# Patient Record
Sex: Male | Born: 1935 | Race: White | Hispanic: No | Smoking: Former smoker
Health system: Southern US, Community
[De-identification: ages and names within clinical notes are randomized; demographics above are authoritative.]

## PROBLEM LIST (undated history)

## (undated) DIAGNOSIS — N400 Enlarged prostate without lower urinary tract symptoms: Secondary | ICD-10-CM

## (undated) DIAGNOSIS — G459 Transient cerebral ischemic attack, unspecified: Secondary | ICD-10-CM

## (undated) DIAGNOSIS — N289 Disorder of kidney and ureter, unspecified: Secondary | ICD-10-CM

## (undated) DIAGNOSIS — I10 Essential (primary) hypertension: Secondary | ICD-10-CM

## (undated) DIAGNOSIS — N189 Chronic kidney disease, unspecified: Secondary | ICD-10-CM

## (undated) DIAGNOSIS — J90 Pleural effusion, not elsewhere classified: Secondary | ICD-10-CM

## (undated) DIAGNOSIS — E785 Hyperlipidemia, unspecified: Secondary | ICD-10-CM

## (undated) DIAGNOSIS — E70319 Ocular albinism, unspecified: Secondary | ICD-10-CM

## (undated) HISTORY — DX: Transient cerebral ischemic attack, unspecified: G45.9

## (undated) HISTORY — PX: LIVER BIOPSY: SHX301

## (undated) HISTORY — DX: Benign prostatic hyperplasia without lower urinary tract symptoms: N40.0

## (undated) HISTORY — DX: Pleural effusion, not elsewhere classified: J90

## (undated) HISTORY — PX: HERNIA REPAIR: SHX51

## (undated) HISTORY — DX: Essential (primary) hypertension: I10

## (undated) HISTORY — DX: Ocular albinism, unspecified: E70.319

## (undated) HISTORY — DX: Chronic kidney disease, unspecified: N18.9

## (undated) HISTORY — DX: Hyperlipidemia, unspecified: E78.5

---

## 1997-12-28 ENCOUNTER — Other Ambulatory Visit: Admission: RE | Admit: 1997-12-28 | Discharge: 1997-12-28 | Payer: Self-pay | Admitting: Urology

## 1998-11-23 ENCOUNTER — Encounter: Payer: Self-pay | Admitting: Emergency Medicine

## 1998-11-23 ENCOUNTER — Emergency Department (HOSPITAL_COMMUNITY): Admission: EM | Admit: 1998-11-23 | Discharge: 1998-11-23 | Payer: Self-pay | Admitting: Emergency Medicine

## 2001-06-29 ENCOUNTER — Ambulatory Visit (HOSPITAL_COMMUNITY): Admission: RE | Admit: 2001-06-29 | Discharge: 2001-06-29 | Payer: Self-pay | Admitting: Gastroenterology

## 2001-06-29 ENCOUNTER — Encounter (INDEPENDENT_AMBULATORY_CARE_PROVIDER_SITE_OTHER): Payer: Self-pay

## 2004-09-02 ENCOUNTER — Ambulatory Visit (HOSPITAL_COMMUNITY): Admission: RE | Admit: 2004-09-02 | Discharge: 2004-09-02 | Payer: Self-pay | Admitting: Gastroenterology

## 2006-04-21 ENCOUNTER — Inpatient Hospital Stay (HOSPITAL_COMMUNITY): Admission: EM | Admit: 2006-04-21 | Discharge: 2006-04-27 | Payer: Self-pay | Admitting: Emergency Medicine

## 2006-04-22 ENCOUNTER — Ambulatory Visit: Payer: Self-pay | Admitting: Internal Medicine

## 2006-04-24 ENCOUNTER — Encounter (INDEPENDENT_AMBULATORY_CARE_PROVIDER_SITE_OTHER): Payer: Self-pay | Admitting: Specialist

## 2006-05-06 ENCOUNTER — Ambulatory Visit (HOSPITAL_COMMUNITY): Admission: RE | Admit: 2006-05-06 | Discharge: 2006-05-06 | Payer: Self-pay | Admitting: Internal Medicine

## 2007-03-21 ENCOUNTER — Emergency Department (HOSPITAL_COMMUNITY): Admission: EM | Admit: 2007-03-21 | Discharge: 2007-03-21 | Payer: Self-pay | Admitting: Emergency Medicine

## 2007-04-02 ENCOUNTER — Ambulatory Visit (HOSPITAL_COMMUNITY): Admission: RE | Admit: 2007-04-02 | Discharge: 2007-04-02 | Payer: Self-pay | Admitting: General Surgery

## 2007-10-07 ENCOUNTER — Ambulatory Visit (HOSPITAL_COMMUNITY): Admission: RE | Admit: 2007-10-07 | Discharge: 2007-10-07 | Payer: Self-pay | Admitting: Internal Medicine

## 2007-10-08 ENCOUNTER — Ambulatory Visit (HOSPITAL_COMMUNITY): Admission: RE | Admit: 2007-10-08 | Discharge: 2007-10-08 | Payer: Self-pay | Admitting: Internal Medicine

## 2007-10-15 ENCOUNTER — Ambulatory Visit (HOSPITAL_COMMUNITY): Admission: RE | Admit: 2007-10-15 | Discharge: 2007-10-15 | Payer: Self-pay | Admitting: Internal Medicine

## 2007-10-15 ENCOUNTER — Encounter (INDEPENDENT_AMBULATORY_CARE_PROVIDER_SITE_OTHER): Payer: Self-pay | Admitting: Diagnostic Radiology

## 2007-11-11 ENCOUNTER — Ambulatory Visit (HOSPITAL_COMMUNITY): Admission: RE | Admit: 2007-11-11 | Discharge: 2007-11-11 | Payer: Self-pay | Admitting: Internal Medicine

## 2007-12-15 ENCOUNTER — Ambulatory Visit (HOSPITAL_COMMUNITY): Admission: RE | Admit: 2007-12-15 | Discharge: 2007-12-15 | Payer: Self-pay | Admitting: Internal Medicine

## 2008-10-08 ENCOUNTER — Emergency Department (HOSPITAL_COMMUNITY): Admission: EM | Admit: 2008-10-08 | Discharge: 2008-10-08 | Payer: Self-pay | Admitting: Emergency Medicine

## 2008-10-08 DIAGNOSIS — E876 Hypokalemia: Secondary | ICD-10-CM | POA: Insufficient documentation

## 2008-10-08 DIAGNOSIS — E860 Dehydration: Secondary | ICD-10-CM

## 2008-10-08 DIAGNOSIS — E871 Hypo-osmolality and hyponatremia: Secondary | ICD-10-CM | POA: Insufficient documentation

## 2008-10-10 ENCOUNTER — Ambulatory Visit (HOSPITAL_COMMUNITY): Admission: RE | Admit: 2008-10-10 | Discharge: 2008-10-10 | Payer: Self-pay | Admitting: Internal Medicine

## 2008-10-13 ENCOUNTER — Ambulatory Visit (HOSPITAL_COMMUNITY): Admission: RE | Admit: 2008-10-13 | Discharge: 2008-10-13 | Payer: Self-pay | Admitting: Internal Medicine

## 2008-10-19 ENCOUNTER — Encounter: Payer: Self-pay | Admitting: Infectious Diseases

## 2008-10-19 DIAGNOSIS — E119 Type 2 diabetes mellitus without complications: Secondary | ICD-10-CM | POA: Insufficient documentation

## 2008-10-19 DIAGNOSIS — IMO0002 Reserved for concepts with insufficient information to code with codable children: Secondary | ICD-10-CM | POA: Insufficient documentation

## 2008-10-20 ENCOUNTER — Ambulatory Visit: Payer: Self-pay | Admitting: Infectious Diseases

## 2008-10-20 DIAGNOSIS — K750 Abscess of liver: Secondary | ICD-10-CM | POA: Insufficient documentation

## 2008-10-24 ENCOUNTER — Ambulatory Visit (HOSPITAL_COMMUNITY): Admission: RE | Admit: 2008-10-24 | Discharge: 2008-10-24 | Payer: Self-pay | Admitting: Internal Medicine

## 2008-10-27 ENCOUNTER — Ambulatory Visit: Payer: Self-pay | Admitting: Infectious Diseases

## 2008-11-30 ENCOUNTER — Ambulatory Visit (HOSPITAL_COMMUNITY): Admission: RE | Admit: 2008-11-30 | Discharge: 2008-11-30 | Payer: Self-pay | Admitting: Internal Medicine

## 2009-06-07 DIAGNOSIS — E785 Hyperlipidemia, unspecified: Secondary | ICD-10-CM | POA: Insufficient documentation

## 2009-06-07 DIAGNOSIS — N401 Enlarged prostate with lower urinary tract symptoms: Secondary | ICD-10-CM | POA: Insufficient documentation

## 2009-06-16 HISTORY — PX: COLON SURGERY: SHX602

## 2009-08-22 ENCOUNTER — Ambulatory Visit (HOSPITAL_COMMUNITY): Admission: RE | Admit: 2009-08-22 | Discharge: 2009-08-22 | Payer: Self-pay | Admitting: Internal Medicine

## 2010-07-07 ENCOUNTER — Encounter: Payer: Self-pay | Admitting: Internal Medicine

## 2010-07-18 NOTE — Assessment & Plan Note (Signed)
Summary: 10:30 new liver abscess   CC:  nausea and pain in ruq .  History of Present Illness: Mr. Gabler is 77 yo man referred by Dr. Othelia Pulling for consulatation for recurrent liver abscess. Mr. Schramm had liver abscesses diagnosed previously in 2006 and 2008 and treated with aspirate on one occasion with negative cultures reportedly and empircal therapy with cipro and metronidazole. I do not have all the details but patient said that he had negative abd ultrasounds. He was diagnosed with current abscess at the beginning of the 4th week of April 2010. Abscess was drained percutaneouly in radiology at [facility] and cultures are sterile but gram stain showed frank pus but no bacteria. Since drainage and empiric therapy with ciprofloxacin and metronidazole begun by Dr. Jacky Kindle, he has improved with resolution of fevers and some improvement generally. WBC has fallen from 20k range to 11k this week. He has local pain at cath drainage and drainage has been decreasin with only 200 or so CCs per 24 hrs. I do not know if he has had MR cholangiogram but if not will recommend to Dr. Jacky Kindle.   Current Allergies (reviewed today): ! PCN Vital Signs:  Patient profile:   77 year old male Temp:     97.0 degrees F (36.11 degrees C) oral Pulse rate:   99 / minute BP sitting:   159 / 84  (left arm)  Vitals Entered By: Starleen Arms CMA (Oct 20, 2008 10:50 AM) CC: nausea, pain in ruq  Is Patient Diabetic? Yes  Pain Assessment Patient in pain? yes     Location: ruq Intensity: 5 Type: aching Onset of pain  Intermittent Nutritional Status Detail no appetite  Have you ever been in a relationship where you felt threatened, hurt or afraid?No   Does patient need assistance? Functional Status Self care Ambulation Normal   Physical Exam  General:  alert, underweight appearing, and pale.   Mouth:  pharynx pink and moist and fair dentition.   Neck:  supple and no masses.   Chest Wall:  no tenderness.    Lungs:  normal respiratory effort and R base crackles.   Heart:  normal rate, no murmur, and no rub.   Abdomen:  RUQ percutaeous drain that is mildly tender when manipulated. No liver felt. Drain bulb has about 100cc of serous drainage and represents last 24 hrs.normal bowel sounds, no distention, no masses, and no rebound tenderness.     Impression & Recommendations:  Problem # 1:  ABSCESS, LIVER, PYOGENIC (ICD-572.0)  Recurrent abscess raises concern for biliary problems and if not done since I don't have access to all records MR cholagniogram should be done. He is responding to empiric antibiotics over a week and a half and will treat for 4 weeks. Will followup in a week.  Orders: Consultation Level IV (04540)  Patient Instructions: 1)  Please schedule a follow-up appointment in 1 week, May 14,2010.

## 2010-07-18 NOTE — Miscellaneous (Signed)
Summary: problem list, medications, allergies  Clinical Lists Changes  Problems: Added new problem of DIABETES MELLITUS (ICD-250.00) Added new problem of HYPERTENSION NEC (ICD-997.91) Medications: Added new medication of LISINOPRIL 20 MG TABS (LISINOPRIL) Take 1 tablet by mouth once a day Added new medication of LISINOPRIL 30 MG TABS (LISINOPRIL) Take 1 tablet by mouth once a day Added new medication of TOPROL XL 50 MG XR24H-TAB (METOPROLOL SUCCINATE) Take 1 tablet by mouth once a day Added new medication of * GLUCOTROL TABS (GLIPIZIDE) Take 1 tablet by mouth once a day Added new medication of METFORMIN HCL 500 MG TABS (METFORMIN HCL) Take 1 tablet by mouth once a day Added new medication of CIPROFLOXACIN HCL 500 MG TABS (CIPROFLOXACIN HCL) Take 1 tablet by mouth two times a day Added new medication of * METRONIDAZOLE TABS (METRONIDAZOLE) Take 1 tablet by mouth two times a day Allergies: Added new allergy or adverse reaction of PCN Observations: Added new observation of NKA: F (10/19/2008 9:21)

## 2010-07-18 NOTE — Miscellaneous (Signed)
Summary: HIPAA Restrictions  HIPAA Restrictions   Imported By: Florinda Marker 10/20/2008 15:36:29  _____________________________________________________________________  External Attachment:    Type:   Image     Comment:   External Document

## 2010-07-18 NOTE — Miscellaneous (Signed)
Summary: Problem list update  Clinical Lists Changes  Problems: Added new problem of DEHYDRATION (ICD-276.51) Added new problem of HYPOKALEMIA (ICD-276.8)

## 2010-09-25 LAB — COMPREHENSIVE METABOLIC PANEL
ALT: 38 U/L (ref 0–53)
AST: 34 U/L (ref 0–37)
Albumin: 2.6 g/dL — ABNORMAL LOW (ref 3.5–5.2)
Alkaline Phosphatase: 102 U/L (ref 39–117)
BUN: 27 mg/dL — ABNORMAL HIGH (ref 6–23)
CO2: 26 mEq/L (ref 19–32)
Calcium: 8.1 mg/dL — ABNORMAL LOW (ref 8.4–10.5)
Chloride: 88 mEq/L — ABNORMAL LOW (ref 96–112)
Creatinine, Ser: 1.46 mg/dL (ref 0.4–1.5)
GFR calc Af Amer: 56 mL/min — ABNORMAL LOW (ref >60)
GFR calc non Af Amer: 46 mL/min — ABNORMAL LOW (ref >60)
Glucose, Bld: 228 mg/dL — ABNORMAL HIGH (ref 70–99)
Potassium: 2.9 mEq/L — ABNORMAL LOW (ref 3.5–5.1)
Sodium: 124 mEq/L — ABNORMAL LOW (ref 135–145)
Total Bilirubin: 0.8 mg/dL (ref 0.3–1.2)
Total Protein: 5.3 g/dL — ABNORMAL LOW (ref 6.0–8.3)

## 2010-09-25 LAB — CBC
HCT: 31.7 % — ABNORMAL LOW (ref 39.0–52.0)
HCT: 32.3 % — ABNORMAL LOW (ref 39.0–52.0)
Hemoglobin: 10.8 g/dL — ABNORMAL LOW (ref 13.0–17.0)
Hemoglobin: 11.5 g/dL — ABNORMAL LOW (ref 13.0–17.0)
MCHC: 34 g/dL (ref 30.0–36.0)
MCHC: 35.5 g/dL (ref 30.0–36.0)
MCV: 92.6 fL (ref 78.0–100.0)
MCV: 93.7 fL (ref 78.0–100.0)
Platelets: 264 10*3/uL (ref 150–400)
Platelets: 418 10*3/uL — ABNORMAL HIGH (ref 150–400)
RBC: 3.38 MIL/uL — ABNORMAL LOW (ref 4.22–5.81)
RBC: 3.49 MIL/uL — ABNORMAL LOW (ref 4.22–5.81)
RDW: 11.8 % (ref 11.5–15.5)
RDW: 12.7 % (ref 11.5–15.5)
WBC: 11.7 10*3/uL — ABNORMAL HIGH (ref 4.0–10.5)
WBC: 13.2 10*3/uL — ABNORMAL HIGH (ref 4.0–10.5)

## 2010-09-25 LAB — CK TOTAL AND CKMB (NOT AT ARMC)
CK, MB: 1.8 ng/mL (ref 0.3–4.0)
Relative Index: INVALID (ref 0.0–2.5)
Total CK: 36 U/L (ref 7–232)

## 2010-09-25 LAB — URINALYSIS, ROUTINE W REFLEX MICROSCOPIC
Bilirubin Urine: NEGATIVE
Glucose, UA: 100 mg/dL — AB
Hgb urine dipstick: NEGATIVE
Nitrite: NEGATIVE
Protein, ur: 30 mg/dL — AB
Specific Gravity, Urine: 1.019 (ref 1.005–1.030)
Urobilinogen, UA: 1 mg/dL (ref 0.0–1.0)
pH: 7 (ref 5.0–8.0)

## 2010-09-25 LAB — DIFFERENTIAL
Basophils Absolute: 0.1 10*3/uL (ref 0.0–0.1)
Basophils Relative: 1 % (ref 0–1)
Eosinophils Absolute: 0.1 10*3/uL (ref 0.0–0.7)
Eosinophils Relative: 1 % (ref 0–5)
Lymphocytes Relative: 5 % — ABNORMAL LOW (ref 12–46)
Lymphs Abs: 0.6 10*3/uL — ABNORMAL LOW (ref 0.7–4.0)
Monocytes Absolute: 1 10*3/uL (ref 0.1–1.0)
Monocytes Relative: 9 % (ref 3–12)
Neutro Abs: 10 10*3/uL — ABNORMAL HIGH (ref 1.7–7.7)
Neutrophils Relative %: 86 % — ABNORMAL HIGH (ref 43–77)

## 2010-09-25 LAB — APTT: aPTT: 29 seconds (ref 24–37)

## 2010-09-25 LAB — GLUCOSE, CAPILLARY: Glucose-Capillary: 75 mg/dL (ref 70–99)

## 2010-09-25 LAB — CULTURE, ROUTINE-ABSCESS: Culture: NO GROWTH

## 2010-09-25 LAB — TROPONIN I: Troponin I: 0.03 ng/mL (ref 0.00–0.06)

## 2010-09-25 LAB — PROTIME-INR
INR: 1.3 (ref 0.00–1.49)
Prothrombin Time: 16.2 seconds — ABNORMAL HIGH (ref 11.6–15.2)

## 2010-09-25 LAB — URINE MICROSCOPIC-ADD ON

## 2010-10-29 NOTE — Op Note (Signed)
NAMEDELIO, SLATES               ACCOUNT NO.:  [id number]   MEDICAL RECORD NO.:  [id number]          PATIENT TYPE:  AMB   LOCATION:  DAY                          FACILITY:  [facility]   PHYSICIAN:  Lennie Muckle, MD      DATE OF BIRTH:  April 13, 1925   DATE OF PROCEDURE:  04/02/2007  DATE OF DISCHARGE:  04/02/2007                               OPERATIVE REPORT   PREOPERATIVE DIAGNOSIS:  Right inguinal hernia.   POSTOPERATIVE DIAGNOSIS:  Right inguinal hernia.   PROCEDURE:  Laparoscopic right inguinal hernia repair with mesh.   SURGEON:  Hotel manager.   ASSISTANT:  Avel Peace.   ANESTHESIA:  General endotracheal anesthesia.   FINDINGS:  Large direct hernia.   SPECIMENS:  None.   BLOOD LOSS:  Minimal blood loss.   COMPLICATIONS:  No immediately complications.   CONDITION:  The patient stable condition to PACU.   INDICATIONS FOR PROCEDURE:  Mr. Hink is an 77 year old male, who is  very active, had complaints of right inguinal pain, which increased with  exertional activities. Was found to have a right inguinal hernia.  Desired to have laparoscopic inguinal hernia repair.   DETAILS OF PROCEDURE:  The patient was identified in the preoperative  holding suite. He did receive IV antibiotics preoperatively of  Vancomycin. He was then taken to the operating room and placed in the  supine position. After administration of general endotracheal  anesthesia, his abdomen and groin were prepped in the usual sterile  fashion. An appropriate time out procedure indicating the patient and  the procedure were performed. An infraumbilical incision was placed with  a #11 blade after anesthetizing with quarter percent Marcaine. Anterior  rectus sheath was identified was incised to identify the rectus muscle.  The preperitoneal space was bluntly dissected with the finger. The space  maker balloon was then placed into the preperitoneal space. The space  was dissected with the balloon under  visualization with the camera.  After waiting an appropriate time for capillary constriction, the  balloon was then removed. Pneumoperitoneum within the preperitoneal  space was established. The hernia was easily identified in the right  inguinal region. There was a direct hernia noted. The hernia sac was  dissected away from the surrounding tissues with blunt dissection. The  spermatic cord and vessels were clearly identified along with the  internal iliac vessels. A 3 x 6 piece of Ultrafoam mesh was placed in  the preperitoneal space. It was secured with multiple tacks around the  direct hernia site, also tacked laterally for positioning of the mesh.  The inferior portion of  the mesh was held in place with release of the pneumoperitoneum. The  fascia was closed wit 0 Vicryl suture. The skin was closed with 4-0  Monocryl. Steri-Strips were placed for final dressing. The patient was  then extubated and transported to the postanesthesia care in stable  condition.      Lennie Muckle, MD  Electronically Signed     ALA/MEDQ  D:  04/02/2007  T:  04/04/2007  Job:  [id number]

## 2010-11-01 NOTE — Discharge Summary (Signed)
NAME:  Aaron Landry NO.:  [id number]   MEDICAL RECORD NO.:  [id number]         PATIENT TYPE:  LINP   LOCATION:                               FACILITY:  [facility]   PHYSICIAN:  Geoffry Paradise, M.D.  DATE OF BIRTH:  May 09, 1925   DATE OF ADMISSION:  04/21/2006  DATE OF DISCHARGE:  04/27/2006                               DISCHARGE SUMMARY   DISCHARGE DIAGNOSES:  1. Hepatic abscess.  2. Diabetes mellitus, type 2.  3. Essential hypertension.  4. Remote transient ischemic attack.  5. Essential hypertension.   HISTORY OF PRESENT ILLNESS:  [name] is a very pleasant gentleman  who is 6, with illnesses including hypertension, diabetes mellitus,  type 2, benign prostatic hypertrophy, hyperlipidemia, remote TIA  presenting at this time with progressive fevers and weakness.  He was  initially seen October 30th, with complaints of feverishness and  myalgias.  He underwent a subsequent 2-week course of workup with  nothing clinically obvious on physical exam and an empiric course of  antibiotic pending titers for [facility] spotted fever.  Ultimately,  with recrudescence and fever up to 102 despite benign abdominal findings  and no GI symptomatology beyond anorexia and nausea, an empiric CT of  the abdomen was done scanning for occult abscess, and this revealed a 6-  to-7-cm parenchymal liver mass felt to be either necrotic tumor or  abscess resulting in this admission.  For further details see the  dictated H&P on the chart.   DATA:  CBC:  Hemoglobin was 10.2, hematocrit 28.7, white blood count  initially 13.5, prior to discharge this was 7.5.  Sedimentation rate was  96.  PT 16.9, PTT 35.  Chemistries upon admission:  Sodium 118,  potassium 2.8, chloride 81, CO2 26, glucose 264, BUN 16, creatinine 1.3,  calcium 7.9, protein 6, albumin 2.2.  Prior to discharge, sodium was up  to 123.  Blood cultures x2 were negative.  Leptospirosis antibody less  than 1:50  which is negative.  Rickettsial titers outpatient when  negative.  EKG normal sinus rhythm, no acute changes.  Chest x-ray:  COPD, no active disease.  CT scan of the abdomen revealed a large  necrotic mass approximately 10 x 7-cm, fell to be more compatible with  abscess but remote necrotic tumor was possible.   HOSPITAL COURSE:  The patient was admitted.  Infectious disease and  interventional radiology consulted.  Percutaneous aspiration and drain  was placed into this large mass and it was clearly compatible with  abscess.  The patient was treated empirically with IV Cipro and  metronidazole as he is panallergic and he ultimately defervesced, gained  strength.  White cell count normalized.  Drainage slowly improved.  Repeat scan showed slow improvement with regards to diminished size down  to approximately 7 x 6-cm, but continued need for catheter drainage.  Cultures remained negative and the patient was discharged on oral  antibiotics to include his Cipro and Flagyl following conversion under  the guidance from infectious disease.  Ultimate aspiration revealed no  malignant cells but did reveal acute inflammatory cells and  cultures  remained negative.   The patient was ultimately discharged on Cipro 500 b.i.d. and  metronidazole 500 b.i.d. indefinitely.   He is to be followed up in the office and ultimately re-scanned with  drain left in until total resolution of this mass.   Diabetes and cardiopulmonary status remained stable throughout  hospitalization.   Overall prognosis is good.           ______________________________  Geoffry Paradise, M.D.     RA/MEDQ  D:  05/19/2006  T:  05/19/2006  Job:  [id number]

## 2010-11-01 NOTE — H&P (Signed)
NAMEAMON, COSTILLA NO.:  [id number]   MEDICAL RECORD NO.:  [id number]          PATIENT TYPE:  INP   LOCATION:  1424                         [facility]AC[facility]   PHYSICIAN:  Geoffry Paradise, M.D.  DATE OF BIRTH:  [birth year]   DATE OF ADMISSION:  04/21/2006  DATE OF DISCHARGE:                                HISTORY & PHYSICAL   CHIEF COMPLAINT:  Fevers and weakness.   HISTORY OF PRESENT ILLNESS:  Aaron Landry is a 77 year old gentleman, well  known to myself, with history of diabetes mellitus, type 2, hypertension,  hyperlipidemia, and remote TIAs, presenting at this time with 7-10 days of  fever, myalgias, and weakness. He was initially seen for these complaints  April 14, 2006, after working, complaining of 5-6 days of feeling ill. He  was seen just 2 weeks earlier, April 02, 2006, doing exceptionally well.  On April 14, 2006, he related illness of 5-6 days consisting of low-grade  fevers, chills, myalgias, but no cough or sputum, no nausea or vomiting, and  no abdominal pain. Chest x-ray at that time was unremarkable and he was  treated empirically with some doxycycline pending [facility] Spotted  Fever titers. Since that time, he has continued with continued  symptomatology to include intermittent fevers sometimes up to 102 but mainly  low grade, chills, myalgias, malaise, and most recently anorexia and low-  grade nausea. He has actually had no abdominal pain, no back pain, no cough  or sputum. He has had no unusual headaches, no rashes, and no joint  symptoms. Bowel habits have been normal with no diarrhea or bright red blood  per rectum. He has had no urinary symptoms. He has had at least 2 workups in  the office to look at his urine which have been negative times 2, blood work  which has been remarkable for a leukocytosis and left shift, as well as a  progressive hyponatremia and mildly abnormal transaminases. In the office  today, given his  continued symptomatology, we did empirically do an  abdominal CT despite a benign exam looking for occult abscess and, indeed,  found a 6-7 cm parenchymal liver mass felt to be either a necrotic tumor or  abscess. He is admitted for further cultures, empiric antibiotics, IV  fluids, and further diagnostic workup.   PAST MEDICAL HISTORY:  The patient is allergic to PENICILLIN.   MEDICATIONS INCLUDE:  1. Glipizide XL 5 mg daily.  2. Lisinopril & hydrochlorothiazide 20/12.5 daily.  3. Toprol XL 50 mg daily.  4. Multivitamin 1 daily.  5. Glucosamine daily.  6. B complex daily.  7. Aspirin 81 mg daily.  8. Skelaxin 800 t.i.d.   MEDICAL ILLNESSES INCLUDE:  1. Diabetes mellitus, type 2, times 15 years.  2. Essential hypertension.  3. BPH.  4. Hyperlipidemia.  5. Remote TIA.   SURGICAL INTERVENTIONS INCLUDE:  1. Colonoscopy 2003 and 2006.  2. Carotid Dopplers 2005.  3. Stress test 1997.  4. Cardiolite stress test in 2000.  5. Hernia repair in 1982.   FAMILY HISTORY:  Noncontributory.   SOCIAL HISTORY:  The  patient is married, retired, Forensic scientist. Does  not smoke or drink. Avid daily exerciser. Has 2 children and 3  grandchildren.   PHYSICAL EXAMINATION:  Temperature 97.7 today, blood pressure 118/60, pulse  76, weight is 175 pounds, actually has stable over the last 6 months. He is  ill appearing, weak, skin turgor is down.  HEENT:  Normocephalic, atraumatic individual, bitemporal wasting, oropharynx  benign.  NECK:  No JVD or bruits.  LYMPH NODES:  Negative.  LUNGS:  Are clear.  CARDIOVASCULAR EXAM:  Regular rate and rhythm. No murmur, gallop, rub, or  heave.  CHEST WALL:  No breast tenderness. No breast mass.  ABDOMEN:  Is soft, nontender. No hepatomegaly. Bowel sounds normal.  BACK:  Is without CVA or spinal tenderness.  EXTREMITIES:  No cyanosis, clubbing, or edema. Intact distal pulses. Joints  normal. Calves soft and nontender.  NEUROLOGIC EXAM:  Is  completely nonlateralizing. Higher cortical function  intact. Motor and sensation intact. Motor and sensation intact. Gait is  normal beyond diffuse weakness.   ASSESSMENT:  1. Febrile illness with systemic symptoms and anorexia with newly found 6-      7 centimeter solitary liver mass, necrotic tumor versus abscess.  2. Diabetes mellitus, type 2.  3. Essential hypertension.  4. Remote transient ischemic attack.   PLAN:  The patient is admitted for empiric antibiotics to include Cipro and  metronidazole given his PENICILLIN ALLERGY. Repeat blood cultures as  outpatient blood cultures were negative. Leptospira titers and ultrasound of  the liver with biopsy/aspiration to discern abscess versus tumor. Further  consultations, whether it be ID, General Surgery, GI, pending this initial  workup.           ______________________________  Geoffry Paradise, M.D.     RA/MEDQ  D:  04/21/2006  T:  04/22/2006  Job:  [id number]

## 2010-11-01 NOTE — Op Note (Signed)
[facility]  Patient:    GIOVONI, BUNCH Visit Number: [id number] MRN: [id number]          Service Type: END Location: ENDO Attending Physician:  Nelda Marseille Dictated by:   Petra Kuba, M.D. Proc. Date: 06/29/01 Admit Date:  06/29/2001   CC:         [name]. Jacky Kindle, M.D.   Operative Report  PROCEDURE:  Colonoscopy.  INDICATION:  Due for colonic screen with some anorectal complaints. Consent was signed after risk, benefits, methods, and options were thoroughly discussed in the office.  MEDICINES USED:  Demerol 100 mg, Versed 8 mg.  DESCRIPTION OF PROCEDURE:  Rectal inspection was pertinent for external hemorrhoids significant. Digital exam was negative. The video colonoscope was inserted and despite significant left-sided diverticula, was able to be advanced to the cecum. This did not require any abdominal pressure or any position changes. The cecum was identified by the appendiceal orifice and the ileocecal valve. The prep was fairly adequate. Due to the number of diverticula, lots of washing and suctioning was done but still some stool balls were unable to be suctioned. On slow withdrawal through the colon, there was a questionable polyp in the ascending colon, which did require a cold biopsy. However, in the mid transverse, a 2-mm polyp was seen and was hot biopsied x 2. There was an occasional right-sided diverticula, but much greater on the left. No other polypoid lesions were seen as we slowly withdrew back to the rectum. In the rectum, two small polyps were seen. Each were hot biopsied and put in a separate container. The scope was retroflexed revealing some small internal hemorrhoids. Scope was straightened, air was suctioned, and scope removed. The patient tolerated the procedure well. There was no obvious immediate complication.  ENDOSCOPIC DIAGNOSES: 1. Internal/external hemorrhoids. 2. Left greater than right  diverticula. 3. Two small rectal polyps, hot biopsy. 4. Transverse small polyp, hot biopsy; ascending, questionable polyp, cold    biopsy. 5. Otherwise within normal limits to the cecum.  PLAN:  Await pathology to determine future colonic screening. Hemorrhoid and diverticula information. Happy to see back p.r.n. Prescription creams p.r.n. for his hemorrhoids. Otherwise, return care to Dr. Jacky Kindle for his customary healthcare maintenance to include yearly rectals and guaiacs. Dictated by:   Petra Kuba, M.D. Attending Physician:  Nelda Marseille DD:  06/29/01 TD:  06/29/01 Job: [id number]

## 2010-11-01 NOTE — Op Note (Signed)
NAMECOYE, DAWOOD               ACCOUNT NO.:  [id number]   MEDICAL RECORD NO.:  [id number]          PATIENT TYPE:  AMB   LOCATION:  ENDO                         FACILITY:  [facility]   PHYSICIAN:  Petra Kuba, M.D.    DATE OF BIRTH:  1925/05/15   DATE OF PROCEDURE:  09/02/2004  DATE OF DISCHARGE:                                 OPERATIVE REPORT   PROCEDURE:  Colonoscopy.   INDICATIONS:  History of colon polyps, due for repeat screening.    Consent was signed after risks, benefits, methods and options thoroughly  discussed in the office in the past.   MEDICATIONS USED:  Demerol 50, Versed 4.   DESCRIPTION OF PROCEDURE:  Rectal inspection is pertinent for external  hemorrhoids.  Small digital exam was negative.  Video pediatric adjustable  colonoscope was inserted and with minimal difficulty despite significant  left-sided diverticula and only a fairly adequate prep, we were able to  advance to the cecum.  Other than the left greater than right diverticula,  no abnormalities were seen.  No position changes or abdominal pressure was  needed.  The cecum was identified by the appendiceal orifice and the  ileocecal valve.  The prep was only fairly adequate.  There was lots of  liquid stool that required washing and suctioning and some stool balls which  were stuck in tics were stuck to the wall of the colon.  Upon slow  withdrawal through the colon, other than the left greater than right  diverticula, no abnormalities were seen.  Anal rectal pull through in  retroflexion confirmed some small hemorrhoids.  The scope was straightened  and re-advanced a short ways up the left side of the colon.  The air was  suctioned, the scope removed.  The patient tolerated the procedure well.  There was no obvious immediate complications.   ENDOSCOPIC DIAGNOSES:  1.  Internal and external hemorrhoids.  2.  Left greater than right diverticula.  3.  Otherwise within normal limits in the cecum.   PLAN:  Recheck screening in five years.  Happy to see back p.r.n. otherwise  return care to Dr. Fredia Beets for the customary health care maintenance to  include yearly rectal's and guaiac's.       ___________________________________________  Petra Kuba, M.D.    MEM/MEDQ  D:  09/02/2004  T:  09/02/2004  Job:  [id number]   cc:   Geoffry Paradise, M.D.  [address]  [place]  [place] [address]  Fax: [phone]

## 2011-03-26 LAB — URINALYSIS, ROUTINE W REFLEX MICROSCOPIC
Bilirubin Urine: NEGATIVE
Glucose, UA: NEGATIVE
Hgb urine dipstick: NEGATIVE
Ketones, ur: NEGATIVE
Nitrite: NEGATIVE
Protein, ur: NEGATIVE
Specific Gravity, Urine: 1.016
Urobilinogen, UA: 0.2
pH: 6

## 2011-03-26 LAB — BASIC METABOLIC PANEL
BUN: 43 — ABNORMAL HIGH
BUN: 51 — ABNORMAL HIGH
CO2: 25
CO2: 29
Calcium: 8.8
Calcium: 9.4
Chloride: 100
Chloride: 101
Creatinine, Ser: 1.28
Creatinine, Ser: 1.41
GFR calc Af Amer: 59 — ABNORMAL LOW
GFR calc Af Amer: >60
GFR calc non Af Amer: 48 — ABNORMAL LOW
GFR calc non Af Amer: 54 — ABNORMAL LOW
Glucose, Bld: 168 — ABNORMAL HIGH
Glucose, Bld: 207 — ABNORMAL HIGH
Potassium: 5
Potassium: 5.6 — ABNORMAL HIGH
Sodium: 132 — ABNORMAL LOW
Sodium: 137

## 2011-03-26 LAB — HEPATIC FUNCTION PANEL
ALT: 25
AST: 28
Albumin: 3.4 — ABNORMAL LOW
Alkaline Phosphatase: 90
Bilirubin, Direct: 0.1
Indirect Bilirubin: 0.9
Total Bilirubin: 1
Total Protein: 6.6

## 2011-03-26 LAB — HEMOGLOBIN AND HEMATOCRIT, BLOOD
HCT: 37.3 — ABNORMAL LOW
Hemoglobin: 13.1

## 2011-03-27 LAB — BASIC METABOLIC PANEL
BUN: 24 — ABNORMAL HIGH
CO2: 25
Calcium: 8.7
Chloride: 97
Creatinine, Ser: 1.15
GFR calc Af Amer: >60
GFR calc non Af Amer: >60
Glucose, Bld: 155 — ABNORMAL HIGH
Potassium: 3.7
Sodium: 129 — ABNORMAL LOW

## 2011-03-27 LAB — POCT CARDIAC MARKERS
CKMB, poc: 5.5
Myoglobin, poc: 150
Operator id: 3206
Troponin i, poc: <0.05

## 2011-03-27 LAB — DIFFERENTIAL
Basophils Absolute: 0
Basophils Relative: 0
Eosinophils Absolute: 0.2
Eosinophils Relative: 3
Lymphocytes Relative: 22
Lymphs Abs: 1.6
Monocytes Absolute: 0.6
Monocytes Relative: 8
Neutro Abs: 4.8
Neutrophils Relative %: 67

## 2011-03-27 LAB — CBC
HCT: 35 — ABNORMAL LOW
Hemoglobin: 12.2 — ABNORMAL LOW
MCHC: 34.7
MCV: 91.2
Platelets: 368
RBC: 3.84 — ABNORMAL LOW
RDW: 12.4
WBC: 7.2

## 2011-07-16 DIAGNOSIS — N529 Male erectile dysfunction, unspecified: Secondary | ICD-10-CM | POA: Insufficient documentation

## 2011-07-16 DIAGNOSIS — R972 Elevated prostate specific antigen [PSA]: Secondary | ICD-10-CM | POA: Insufficient documentation

## 2011-12-05 ENCOUNTER — Other Ambulatory Visit: Payer: Self-pay

## 2011-12-05 DIAGNOSIS — I739 Peripheral vascular disease, unspecified: Secondary | ICD-10-CM

## 2011-12-05 DIAGNOSIS — M79609 Pain in unspecified limb: Secondary | ICD-10-CM

## 2012-01-16 ENCOUNTER — Encounter: Payer: Self-pay | Admitting: Vascular Surgery

## 2012-01-26 ENCOUNTER — Encounter: Payer: Self-pay | Admitting: Vascular Surgery

## 2012-01-27 ENCOUNTER — Encounter (INDEPENDENT_AMBULATORY_CARE_PROVIDER_SITE_OTHER): Payer: Medicare Other | Admitting: *Deleted

## 2012-01-27 ENCOUNTER — Ambulatory Visit (INDEPENDENT_AMBULATORY_CARE_PROVIDER_SITE_OTHER): Payer: Medicare Other | Admitting: Vascular Surgery

## 2012-01-27 ENCOUNTER — Encounter: Payer: Self-pay | Admitting: Vascular Surgery

## 2012-01-27 VITALS — BP 135/65 | HR 62 | Resp 16 | Ht 72.0 in | Wt 162.0 lb

## 2012-01-27 DIAGNOSIS — I739 Peripheral vascular disease, unspecified: Secondary | ICD-10-CM

## 2012-01-27 DIAGNOSIS — M79609 Pain in unspecified limb: Secondary | ICD-10-CM

## 2012-01-27 NOTE — Progress Notes (Signed)
[facility] [place]   Patient name: Aaron Landry MRN: [id number] DOB: 10-Apr-1925 Sex: male   Referred by: Jacky Kindle  Reason for referral:  Chief Complaint  Patient presents with  . New Evaluation    leg pain/ Dr. Jacky Kindle    HISTORY OF PRESENT ILLNESS: Patient presents today for evaluation of lower extremity symptoms. He is an active healthy 78 year old gentleman who reported generalized tiredness in both legs. He reports this is equal in his right and left leg. This does not severely limiting and he felt this may be simply related to "old age". He denies any tissue loss and does not have any specific claudication type symptoms. He is on active walking program. He underwent an outpatient ultrasound of his lower extremity is in this did suggest some narrowing in his distal right superficial femoral artery. He did not have any ankle arm indices studies at the same time.  Past Medical History  Diagnosis Date  . Diabetes mellitus   . Hypertension   . Hyperlipidemia   . Pleural effusion   . TIA (transient ischemic attack)   . BPH (benign prostatic hyperplasia)   . OA (ocular albinism)   . Chronic kidney disease     Past Surgical History  Procedure Date  . Hernia repair   . Liver biopsy   . Colon surgery 2011    History   Social History  . Marital Status: Married    Spouse Name: N/A    Number of Children: N/A  . Years of Education: N/A   Occupational History  . Not on file.   Social History Main Topics  . Smoking status: Former Smoker    Types: Cigarettes    Quit date: 06/16/1981  . Smokeless tobacco: Never Used  . Alcohol Use: No  . Drug Use: No  . Sexually Active: Not on file   Other Topics Concern  . Not on file   Social History Narrative  . No narrative on file    Family History  Problem Relation Age of Onset  . Alzheimer's disease Mother   . Coronary artery disease Father   . Diabetes Father   . Heart attack Father      Allergies as of 01/27/2012 - Review Complete 01/27/2012  Allergen Reaction Noted  . Penicillins  10/19/2008    Current Outpatient Prescriptions on File Prior to Visit  Medication Sig Dispense Refill  . aspirin EC 81 MG tablet Take 81 mg by mouth daily.      Marland Kitchen glipiZIDE (GLUCOTROL) 10 MG tablet Take 10 mg by mouth 2 (two) times daily before a meal.      . lisinopril-hydrochlorothiazide (PRINZIDE,ZESTORETIC) 20-12.5 MG per tablet Take 1 tablet by mouth daily.      . metFORMIN (GLUCOPHAGE) 500 MG tablet Take 500 mg by mouth 2 (two) times daily with a meal.      . metoprolol tartrate (LOPRESSOR) 25 MG tablet Take 25 mg by mouth daily.         REVIEW OF SYSTEMS:  Positives indicated with an "X"  CARDIOVASCULAR:  [ ]  chest pain   [ ]  chest pressure   [ ]  palpitations   [ ]  orthopnea   [ ]  dyspnea on exertion   [ ]  claudication   [ ]  rest pain   [ ]  DVT   [ ]  phlebitis PULMONARY:   [ ]  productive cough   [ ]  asthma   [ ]  wheezing NEUROLOGIC:   [ ]  weakness  [ ]   paresthesias  [ ]  aphasia  [ ]  amaurosis  [ ]  dizziness HEMATOLOGIC:   [ ]  bleeding problems   [ ]  clotting disorders MUSCULOSKELETAL:  [ ]  joint pain   [ ]  joint swelling GASTROINTESTINAL: [ ]   blood in stool  [ ]   hematemesis GENITOURINARY:  [ ]   dysuria  [ ]   hematuria PSYCHIATRIC:  [ ]  history of major depression INTEGUMENTARY:  [ ]  rashes  [ ]  ulcers CONSTITUTIONAL:  [ ]  fever   [ ]  chills  PHYSICAL EXAMINATION:  General: The patient is a well-nourished male, in no acute distress. Vital signs are BP 135/65  Pulse 62  Resp 16  Ht 6' (1.829 m)  Wt 162 lb (73.483 kg)  BMI 21.97 kg/m2  SpO2 100% Pulmonary: There is a good air exchange bilaterally without wheezing or rales. Abdomen: Soft and non-tender with normal pitch bowel sounds. Musculoskeletal: There are no major deformities.  There is no significant extremity pain. Neurologic: No focal weakness or paresthesias are detected, Skin: There are no ulcer or rashes  noted. Psychiatric: The patient has normal affect. Cardiovascular: There is a regular rate and rhythm without significant murmur appreciated. Pulse status: 2+ radial 2+ femoral 2+ popliteal pulses bilaterally. He does have 2+ right posterior tibial and absent dorsalis pedis pulse he has palpable left dorsalis pedis and posterior tibial pulses  VVS Vascular Lab Studies:  Ordered and Independently Reviewed normal ankle arm indices bilaterally and normal triphasic waveforms and digital pressure bilaterally  Impression and Plan:  I did review his outpatient ultrasound from another lab in this does suggest elevated velocities in his distal right superficial femoral artery. I explained the patient is certainly is not causing any flow limitation with no active symptoms and no evidence of lower surety arterial insufficiency in our vascular lab. He is comfortable this discussion will continue his walking program we'll notify should he develop any progressive pain with walking or rest pain.    Kyndal Gloster [facility] [place] Office: [phone]

## 2012-06-15 DIAGNOSIS — M5136 Other intervertebral disc degeneration, lumbar region: Secondary | ICD-10-CM | POA: Insufficient documentation

## 2012-07-12 DIAGNOSIS — E1142 Type 2 diabetes mellitus with diabetic polyneuropathy: Secondary | ICD-10-CM | POA: Insufficient documentation

## 2013-09-28 DIAGNOSIS — M1990 Unspecified osteoarthritis, unspecified site: Secondary | ICD-10-CM | POA: Insufficient documentation

## 2014-08-23 ENCOUNTER — Other Ambulatory Visit (HOSPITAL_COMMUNITY): Payer: Self-pay | Admitting: Internal Medicine

## 2014-08-23 DIAGNOSIS — K750 Abscess of liver: Secondary | ICD-10-CM

## 2014-08-24 ENCOUNTER — Other Ambulatory Visit: Payer: Self-pay | Admitting: Radiology

## 2014-08-24 ENCOUNTER — Encounter (HOSPITAL_COMMUNITY): Payer: Self-pay

## 2014-08-24 ENCOUNTER — Ambulatory Visit (HOSPITAL_COMMUNITY)
Admission: RE | Admit: 2014-08-24 | Discharge: 2014-08-24 | Disposition: A | Discharge status: Home / Self Care | Payer: Medicare Other | Source: Ambulatory Visit | Attending: Internal Medicine | Admitting: Internal Medicine

## 2014-08-24 ENCOUNTER — Observation Stay (HOSPITAL_COMMUNITY)
Admission: AD | Admit: 2014-08-24 | Discharge: 2014-08-25 | Disposition: A | Discharge status: Home / Self Care | Payer: Medicare Other | Source: Ambulatory Visit | Attending: Internal Medicine | Admitting: Internal Medicine

## 2014-08-24 VITALS — BP 133/80 | HR 87 | Temp 98.3°F | Resp 13 | Ht 72.0 in | Wt 150.0 lb

## 2014-08-24 DIAGNOSIS — E875 Hyperkalemia: Secondary | ICD-10-CM

## 2014-08-24 DIAGNOSIS — N189 Chronic kidney disease, unspecified: Secondary | ICD-10-CM | POA: Diagnosis not present

## 2014-08-24 DIAGNOSIS — E70319 Ocular albinism, unspecified: Secondary | ICD-10-CM | POA: Insufficient documentation

## 2014-08-24 DIAGNOSIS — Z7982 Long term (current) use of aspirin: Secondary | ICD-10-CM | POA: Diagnosis not present

## 2014-08-24 DIAGNOSIS — Z79899 Other long term (current) drug therapy: Secondary | ICD-10-CM | POA: Insufficient documentation

## 2014-08-24 DIAGNOSIS — K750 Abscess of liver: Secondary | ICD-10-CM | POA: Diagnosis present

## 2014-08-24 DIAGNOSIS — I129 Hypertensive chronic kidney disease with stage 1 through stage 4 chronic kidney disease, or unspecified chronic kidney disease: Secondary | ICD-10-CM | POA: Diagnosis not present

## 2014-08-24 DIAGNOSIS — Z87891 Personal history of nicotine dependence: Secondary | ICD-10-CM | POA: Diagnosis not present

## 2014-08-24 DIAGNOSIS — J90 Pleural effusion, not elsewhere classified: Secondary | ICD-10-CM | POA: Insufficient documentation

## 2014-08-24 DIAGNOSIS — Z8673 Personal history of transient ischemic attack (TIA), and cerebral infarction without residual deficits: Secondary | ICD-10-CM | POA: Diagnosis not present

## 2014-08-24 DIAGNOSIS — N1832 Chronic kidney disease, stage 3b: Secondary | ICD-10-CM | POA: Insufficient documentation

## 2014-08-24 DIAGNOSIS — E118 Type 2 diabetes mellitus with unspecified complications: Secondary | ICD-10-CM

## 2014-08-24 DIAGNOSIS — E860 Dehydration: Secondary | ICD-10-CM | POA: Diagnosis present

## 2014-08-24 DIAGNOSIS — E785 Hyperlipidemia, unspecified: Secondary | ICD-10-CM | POA: Insufficient documentation

## 2014-08-24 DIAGNOSIS — E119 Type 2 diabetes mellitus without complications: Secondary | ICD-10-CM | POA: Insufficient documentation

## 2014-08-24 DIAGNOSIS — I10 Essential (primary) hypertension: Secondary | ICD-10-CM

## 2014-08-24 DIAGNOSIS — E871 Hypo-osmolality and hyponatremia: Secondary | ICD-10-CM | POA: Diagnosis present

## 2014-08-24 DIAGNOSIS — E1122 Type 2 diabetes mellitus with diabetic chronic kidney disease: Secondary | ICD-10-CM | POA: Insufficient documentation

## 2014-08-24 DIAGNOSIS — N179 Acute kidney failure, unspecified: Secondary | ICD-10-CM

## 2014-08-24 DIAGNOSIS — R630 Anorexia: Secondary | ICD-10-CM | POA: Diagnosis not present

## 2014-08-24 DIAGNOSIS — N400 Enlarged prostate without lower urinary tract symptoms: Secondary | ICD-10-CM | POA: Diagnosis not present

## 2014-08-24 LAB — CBC WITH DIFFERENTIAL/PLATELET
Basophils Absolute: 0 10*3/uL (ref 0.0–0.1)
Basophils Relative: 0 % (ref 0–1)
Eosinophils Absolute: 0.1 10*3/uL (ref 0.0–0.7)
Eosinophils Relative: 1 % (ref 0–5)
HCT: 31.3 % — ABNORMAL LOW (ref 39.0–52.0)
Hemoglobin: 10.8 g/dL — ABNORMAL LOW (ref 13.0–17.0)
Lymphocytes Relative: 8 % — ABNORMAL LOW (ref 12–46)
Lymphs Abs: 1 10*3/uL (ref 0.7–4.0)
MCH: 30.8 pg (ref 26.0–34.0)
MCHC: 34.5 g/dL (ref 30.0–36.0)
MCV: 89.2 fL (ref 78.0–100.0)
Monocytes Absolute: 1.1 10*3/uL — ABNORMAL HIGH (ref 0.1–1.0)
Monocytes Relative: 9 % (ref 3–12)
Neutro Abs: 10.7 10*3/uL — ABNORMAL HIGH (ref 1.7–7.7)
Neutrophils Relative %: 82 % — ABNORMAL HIGH (ref 43–77)
Platelets: 488 10*3/uL — ABNORMAL HIGH (ref 150–400)
RBC: 3.51 MIL/uL — ABNORMAL LOW (ref 4.22–5.81)
RDW: 13 % (ref 11.5–15.5)
WBC: 12.9 10*3/uL — ABNORMAL HIGH (ref 4.0–10.5)

## 2014-08-24 LAB — GLUCOSE, CAPILLARY
Glucose-Capillary: 157 mg/dL — ABNORMAL HIGH (ref 70–99)
Glucose-Capillary: 136 mg/dL — ABNORMAL HIGH (ref 70–99)

## 2014-08-24 LAB — BASIC METABOLIC PANEL
Anion gap: 7 (ref 5–15)
BUN: 38 mg/dL — ABNORMAL HIGH (ref 6–23)
CO2: 25 mmol/L (ref 19–32)
Calcium: 8.7 mg/dL (ref 8.4–10.5)
Chloride: 97 mmol/L (ref 96–112)
Creatinine, Ser: 1.65 mg/dL — ABNORMAL HIGH (ref 0.50–1.35)
GFR calc Af Amer: 41 mL/min — ABNORMAL LOW (ref >90)
GFR calc non Af Amer: 36 mL/min — ABNORMAL LOW (ref >90)
Glucose, Bld: 206 mg/dL — ABNORMAL HIGH (ref 70–99)
Potassium: 5.7 mmol/L — ABNORMAL HIGH (ref 3.5–5.1)
Sodium: 129 mmol/L — ABNORMAL LOW (ref 135–145)

## 2014-08-24 LAB — PROTIME-INR
INR: 1.28 (ref 0.00–1.49)
Prothrombin Time: 16.1 seconds — ABNORMAL HIGH (ref 11.6–15.2)

## 2014-08-24 MED ORDER — SODIUM CHLORIDE 0.9 % IV SOLN
INTRAVENOUS | Status: AC
  Administered 2014-08-24: 18:00:00 via INTRAVENOUS

## 2014-08-24 MED ORDER — DOCUSATE SODIUM 100 MG PO CAPS
100.0000 mg | ORAL_CAPSULE | Freq: Two times a day (BID) | ORAL | Status: DC
  Administered 2014-08-24 – 2014-08-25 (×2): 100 mg via ORAL
  Filled 2014-08-24 (×3): qty 1

## 2014-08-24 MED ORDER — INSULIN ASPART 100 UNIT/ML ~~LOC~~ SOLN
0.0000 [IU] | Freq: Three times a day (TID) | SUBCUTANEOUS | Status: DC
  Administered 2014-08-25: 1 [IU] via SUBCUTANEOUS

## 2014-08-24 MED ORDER — CIPROFLOXACIN HCL 500 MG PO TABS
500.0000 mg | ORAL_TABLET | Freq: Every day | ORAL | Status: DC
  Administered 2014-08-24 – 2014-08-25 (×2): 500 mg via ORAL
  Filled 2014-08-24 (×2): qty 1

## 2014-08-24 MED ORDER — MIDAZOLAM HCL 2 MG/2ML IJ SOLN
INTRAMUSCULAR | Status: AC | PRN
  Administered 2014-08-24: 0.5 mg via INTRAVENOUS

## 2014-08-24 MED ORDER — METRONIDAZOLE 500 MG PO TABS
500.0000 mg | ORAL_TABLET | Freq: Two times a day (BID) | ORAL | Status: DC
  Administered 2014-08-24 – 2014-08-25 (×2): 500 mg via ORAL
  Filled 2014-08-24 (×3): qty 1

## 2014-08-24 MED ORDER — TRAMADOL HCL 50 MG PO TABS
50.0000 mg | ORAL_TABLET | Freq: Four times a day (QID) | ORAL | Status: DC | PRN
  Administered 2014-08-25: 50 mg via ORAL
  Filled 2014-08-24 (×2): qty 1

## 2014-08-24 MED ORDER — FENTANYL CITRATE 0.05 MG/ML IJ SOLN
INTRAMUSCULAR | Status: AC
  Filled 2014-08-24: qty 2

## 2014-08-24 MED ORDER — SODIUM POLYSTYRENE SULFONATE 15 GM/60ML PO SUSP
30.0000 g | Freq: Once | ORAL | Status: AC
  Administered 2014-08-24: 30 g via ORAL
  Filled 2014-08-24: qty 120

## 2014-08-24 MED ORDER — ALUM & MAG HYDROXIDE-SIMETH 200-200-20 MG/5ML PO SUSP: 30.0000 mL | Freq: Four times a day (QID) | ORAL | Status: DC | PRN

## 2014-08-24 MED ORDER — SODIUM CHLORIDE 0.9 % IV SOLN
INTRAVENOUS | Status: DC
  Administered 2014-08-24: 13:00:00 via INTRAVENOUS

## 2014-08-24 MED ORDER — ONDANSETRON HCL 4 MG/2ML IJ SOLN: 4.0000 mg | Freq: Four times a day (QID) | INTRAMUSCULAR | Status: DC | PRN

## 2014-08-24 MED ORDER — ACETAMINOPHEN 325 MG PO TABS: 650.0000 mg | ORAL_TABLET | Freq: Four times a day (QID) | ORAL | Status: DC | PRN

## 2014-08-24 MED ORDER — HYDRALAZINE HCL 20 MG/ML IJ SOLN: 10.0000 mg | Freq: Four times a day (QID) | INTRAMUSCULAR | Status: DC | PRN

## 2014-08-24 MED ORDER — METOPROLOL TARTRATE 25 MG PO TABS
25.0000 mg | ORAL_TABLET | Freq: Two times a day (BID) | ORAL | Status: DC
  Administered 2014-08-24 – 2014-08-25 (×2): 25 mg via ORAL
  Filled 2014-08-24 (×3): qty 1

## 2014-08-24 MED ORDER — MIDAZOLAM HCL 2 MG/2ML IJ SOLN
INTRAMUSCULAR | Status: DC
Start: 2014-08-24 — End: 2014-08-25
  Filled 2014-08-24: qty 2

## 2014-08-24 MED ORDER — OXYCODONE HCL 5 MG PO TABS: 5.0000 mg | ORAL_TABLET | ORAL | Status: DC | PRN

## 2014-08-24 MED ORDER — FENTANYL CITRATE 0.05 MG/ML IJ SOLN
INTRAMUSCULAR | Status: AC | PRN
  Administered 2014-08-24: 25 ug via INTRAVENOUS

## 2014-08-24 MED ORDER — ACETAMINOPHEN 650 MG RE SUPP: 650.0000 mg | Freq: Four times a day (QID) | RECTAL | Status: DC | PRN

## 2014-08-24 MED ORDER — ASPIRIN EC 81 MG PO TBEC
81.0000 mg | DELAYED_RELEASE_TABLET | Freq: Every day | ORAL | Status: DC
  Administered 2014-08-25: 81 mg via ORAL
  Filled 2014-08-24: qty 1

## 2014-08-24 MED ORDER — HEPARIN SODIUM (PORCINE) 5000 UNIT/ML IJ SOLN
5000.0000 [IU] | Freq: Three times a day (TID) | INTRAMUSCULAR | Status: DC
  Administered 2014-08-24 – 2014-08-25 (×2): 5000 [IU] via SUBCUTANEOUS
  Filled 2014-08-24 (×5): qty 1

## 2014-08-24 MED ORDER — LIDOCAINE HCL (PF) 1 % IJ SOLN
INTRAMUSCULAR | Status: DC
Start: 2014-08-24 — End: 2014-08-25
  Filled 2014-08-24: qty 10

## 2014-08-24 MED ORDER — ONDANSETRON HCL 4 MG PO TABS: 4.0000 mg | ORAL_TABLET | Freq: Four times a day (QID) | ORAL | Status: DC | PRN

## 2014-08-24 NOTE — Progress Notes (Signed)
Patient arrived to 6e18 from IR after having a liver biopsy. Patient is A&OX4 with no s/s distress noted. Right abdomen with JP drain intact and gauze dressing clean, dry, intact. Awaiting medication orders to be verified. Drain flushed with 5mls normal saline and emptied 40mls cloudy, brown/pink drainage without odor. Patient c/o mild abdominal discomfort. Will medicate with prn's.   Leanna BattlesEckelmann, Kellyann Ordway Eileen, RN.

## 2014-08-24 NOTE — Progress Notes (Signed)
Home medications were not reconciled by pharmacy.  Metoprolol has changed to bid since his previous med rec.    His HCTZ-Lisinopril is being held due to mild dehydration and mildly elevated BUN/Creatinine.    Algis DownsMarianne York, PA-C [facility] Pager: [phone]

## 2014-08-24 NOTE — H&P (Signed)
Chief Complaint: "I am here for a drain to be placed."  Referring Physician(s): [name],Richard  History of Present Illness: Aaron Landry is a 81 y.o. male with history of recurrent hepatic abscesses s/p percutaneous drainage in 2007 and 2010. He has been seen by Dr. Jacky KindleAronson with c/o nausea, fever chills, night sweats and fatigue. [facility] new hepatic fluid collection concerning for abscess and IR received request for STAT hepatic abscess drain placement. The patient states he has been taking oral Cipro and Flagyl since last Friday. He still c/o ongoing nausea, lack of appetite and fatigue. He denies any changes to his bowel movements. He denies any chest pain, shortness of breath or palpitations. He denies any active signs of bleeding or excessive bruising. The patient denies any history of sleep apnea or chronic oxygen use. He has previously tolerated sedation without complications.    Past Medical History  Diagnosis Date  . Diabetes mellitus   . Hypertension   . Hyperlipidemia   . Pleural effusion   . TIA (transient ischemic attack)   . BPH (benign prostatic hyperplasia)   . OA (ocular albinism)   . Chronic kidney disease     Past Surgical History  Procedure Laterality Date  . Hernia repair    . Liver biopsy    . Colon surgery  2011    Allergies: Penicillins  Medications: Prior to Admission medications   Medication Sig Start Date End Date Taking? Authorizing Provider  aspirin EC 81 MG tablet Take 81 mg by mouth daily.    Historical Provider, MD  Cholecalciferol (VITAMIN D-3 PO) Take by mouth daily.    Historical Provider, MD  Copper Gluconate (COPPER CAPS PO) Take by mouth daily.    Historical Provider, MD  glipiZIDE (GLUCOTROL) 10 MG tablet Take 10 mg by mouth 2 (two) times daily before a meal.    Historical Provider, MD  lisinopril-hydrochlorothiazide (PRINZIDE,ZESTORETIC) 20-12.5 MG per tablet Take 1 tablet by mouth daily.    Historical  Provider, MD  metFORMIN (GLUCOPHAGE) 500 MG tablet Take 500 mg by mouth 2 (two) times daily with a meal.    Historical Provider, MD  Methylsulfonylmethane (MSM PO) Take by mouth daily.    Historical Provider, MD  metoprolol tartrate (LOPRESSOR) 25 MG tablet Take 25 mg by mouth daily. 12/12/11   Historical Provider, MD  SELENIUM PO Take by mouth daily.    Historical Provider, MD  VITAMIN B COMPLEX-C PO Take by mouth daily.    Historical Provider, MD     Family History  Problem Relation Age of Onset  . Alzheimer's disease Mother   . Coronary artery disease Father   . Diabetes Father   . Heart attack Father     History   Social History  . Marital Status: Married    Spouse Name: N/A  . Number of Children: N/A  . Years of Education: N/A   Social History Main Topics  . Smoking status: Former Smoker    Types: Cigarettes    Quit date: 06/16/1981  . Smokeless tobacco: Never Used  . Alcohol Use: No  . Drug Use: No  . Sexual Activity: Not on file   Other Topics Concern  . None   Social History Narrative    Review of Systems: A 12 point ROS discussed and pertinent positives are indicated in the HPI above.  All other systems are negative.  Review of Systems  Vital Signs: BP 108/56 mmHg  Pulse 82  Temp(Src) 98.3 F (36.8 C)  Resp 18  Ht 6' (1.829 m)  Wt 150 lb (68.04 kg)  BMI 20.34 kg/m2  SpO2 97%  Physical Exam  Constitutional: He is oriented to person, place, and time. No distress.  HENT:  Head: Normocephalic and atraumatic.  Neck: No tracheal deviation present.  Cardiovascular: Normal rate and regular rhythm.  Exam reveals no gallop and no friction rub.   No murmur heard. Pulmonary/Chest: Effort normal and breath sounds normal. No respiratory distress. He has no wheezes. He has no rales.  Abdominal: Soft. Bowel sounds are normal. He exhibits no distension. There is no tenderness.  Neurological: He is alert and oriented to person, place, and time.  Skin: He is not  diaphoretic.  Psychiatric: He has a normal mood and affect. His behavior is normal. Thought content normal.   Mallampati Score:  MD Evaluation Airway: WNL Heart: WNL Abdomen: WNL Chest/ Lungs: WNL ASA  Classification: 2 Mallampati/Airway Score: Two  Imaging: No results found.  Labs:  CBC: No results for input(s): WBC, HGB, HCT, PLT in the last 8760 hours.  COAGS: No results for input(s): INR, APTT in the last 8760 hours.  BMP: No results for input(s): NA, K, CL, CO2, GLUCOSE, BUN, CALCIUM, CREATININE, GFRNONAA, GFRAA in the last 8760 hours.  Invalid input(s): CMP  LIVER FUNCTION TESTS: No results for input(s): BILITOT, AST, ALT, ALKPHOS, PROT, ALBUMIN in the last 8760 hours.   Assessment and Plan: Recurrent hepatic abscess, post drain placement in 2007 and 2010 CT done at [facility] 08/21/14, reviewed  The patient has been NPO, no blood thinners taken, images, labs and vitals have been reviewed. Risks and Benefits discussed with the patient including bleeding, infection, damage to adjacent structures and sepsis. All of the patient's questions were answered, patient is agreeable to proceed. Consent signed and in chart. Appreciate [facility] assistance with admission and medical management in case of sepsis post drain placement. DM2, on Metformin and Glipizide  HTN, on Lisinopril/HCTZ and Lopressor OA CKD   Thank you for this interesting consult.  I greatly enjoyed meeting Aaron Landry and look forward to participating in their care.  SignedBerneta Levins 08/24/2014, 1:30 PM   I spent a total of 20 Minutes in face to face in clinical consultation, greater than 50% of which was counseling/coordinating care for recurrent liver abscess.

## 2014-08-24 NOTE — Sedation Documentation (Signed)
Patient denies pain and is resting comfortably.  

## 2014-08-24 NOTE — Procedures (Signed)
Interventional Radiology Procedure Note  Procedure: US guided 10.29F pigtail drain into the right liver lobe abscess.  Purulent material aspirated.  Bulb suction drain.  Complications: No immediate Recommendations:  - Ok to shower tomorrow - Do not submerge for 7 days - Routine care  - record output daily  Signed,  Yvone NeuJaime S. Loreta AveWagner, DO

## 2014-08-24 NOTE — H&P (Signed)
[facility] History and Physical                                                                                    Aaron Landry, is Landry 81 y.o. male  MRN: [id number]   DOB - Jan 15, 1925  Admit Date - 08/24/2014  Outpatient Primary MD for the patient is Aaron A, MD  With History of -  Past Medical History  Diagnosis Date  . Diabetes mellitus   . Hypertension   . Hyperlipidemia   . Pleural effusion   . TIA (transient ischemic attack)   . BPH (benign prostatic hyperplasia)   . OA (ocular albinism)   . Chronic kidney disease       Past Surgical History  Procedure Laterality Date  . Hernia repair    . Liver biopsy    . Colon surgery  2011    in for observation after liver abscess drainage by interventional radiology- for potential sepsis complication.     HPI  Aaron Landry  is Landry 81 y.o. male, with past medical history of recurrent hepatic abscess, TIA, diabetes mellitus, chronic kidney disease with Landry baseline creatinine of approximately 1.5.  We have been asked by interventional radiology and Aaron Landry to admit this patient overnight for observation after liver abscess drainage.  The patient reports that he has been feeling poorly with decreased appetite, no energy, joint pain for the past 2 weeks. These symptoms are similar to what he has had in the past with his previous 3 liver abscesses. Last Friday 3/4 he went to his primary care physician, Aaron Landry, for his symptoms. An ultrasound showed the abscess. The patient reports that on Monday 3/7 Landry CT scan was performed that showed the abscess. These radiology results are not in EPIC. The patient reports 2 episodes of vomiting over the past 2 weeks, and subjective low-grade fevers, but he denies diarrhea, cough, shortness of breath, significant abdominal pain, clay-colored stools, frank bruising or bleeding. He has started no new medications.  Labs are significant for sodium of 129, potassium 5.7, BUN  38/creatinine 1.65, WBC 12.9, hemoglobin 10.8, and prothrombin time of 16.1.     Review of Systems   In addition to the HPI above,   No Headache, No changes with Vision or hearing, No problems swallowing food or Liquids, No Chest pain, Cough or Shortness of Breath, No Blood in stool or Urine, No dysuria, No new skin rashes or bruises, No new weakness, tingling, numbness in any extremity, No recent weight gain or loss, Landry full 10 point Review of Systems was done, except as stated above, all other Review of Systems were negative.  Social History History  Substance Use Topics  . Smoking status: Former Smoker    Types: Cigarettes    Quit date: 06/16/1981  . Smokeless tobacco: Never Used  . Alcohol Use: No    Family History Family History  Problem Relation Age of Onset  . Alzheimer's disease Mother   . Coronary artery disease Father   . Diabetes Father   . Heart attack Father     Prior to Admission medications   Medication Sig Start Date End Date  Taking? Authorizing Provider  aspirin EC 81 MG tablet Take 81 mg by mouth daily.   Yes Historical Provider, MD  Cholecalciferol (VITAMIN D-3 PO) Take by mouth daily.   Yes Historical Provider, MD  ciprofloxacin (CIPRO) 500 MG tablet Take 500 mg by mouth daily. 7 day regimen started 3/4, has taken morning dose 08/18/14  Yes Historical Provider, MD  glipiZIDE (GLUCOTROL) 10 MG tablet Take 10 mg by mouth 2 (two) times daily before Landry meal.   Yes Historical Provider, MD  lisinopril-hydrochlorothiazide (PRINZIDE,ZESTORETIC) 20-12.5 MG per tablet Take 1 tablet by mouth daily.   Yes Historical Provider, MD  metFORMIN (GLUCOPHAGE) 500 MG tablet Take 500 mg by mouth 2 (two) times daily with Landry meal.   Yes Historical Provider, MD  metoprolol tartrate (LOPRESSOR) 25 MG tablet Take 25 mg by mouth daily. 12/12/11  Yes Historical Provider, MD  metroNIDAZOLE (FLAGYL) 500 MG tablet Take 500 mg by mouth 2 (two) times daily. 7 day regimen, started 3/4 and has  taken morning dose 08/18/14  Yes Historical Provider, MD  VITAMIN B COMPLEX-C PO Take by mouth daily.   Yes Historical Provider, MD    Allergies  Allergen Reactions  . Penicillins     Physical Exam  Vitals  Blood pressure 154/66, pulse 69, temperature 98.1 F (36.7 C), temperature source Oral, resp. rate 16, SpO2 99 %.   General:  Thin, elderly gentleman lying comfortably in bed. Seen in his hospital room.  Psych:  Normal affect and insight, Not Suicidal or Homicidal, Awake Alert, Oriented X 3.  Neuro:   No F.N deficits, ALL C.Nerves Intact, Strength 5/5 all 4 extremities, Sensation intact all 4 extremities.  ENT:  Ears and Eyes appear Normal, sclera are anicteric Conjunctivae clear, PER. Moist oral mucosa without erythema or exudates.  Neck:  Supple, No lymphadenopathy appreciated  Respiratory:  Symmetrical chest wall movement, Good air movement bilaterally, CTAB.  Cardiac:  RRR, No Murmurs, no LE edema noted, no JVD.    Abdomen:  Positive bowel sounds, Soft, Non tender, Non distended,  No masses appreciated. No caput medusa, no telangiectasias. RUQ drain with blood tinged fluid in bulb.  Skin:  No Cyanosis, Normal Skin Turgor, No Skin Rash or Bruise., No jaundice.  Extremities:  Able to move all 4. 5/5 strength in each,  no effusions., No asterixis  Data Review  CBC  Recent Labs Lab 08/24/14 1300  WBC 12.9*  HGB 10.8*  HCT 31.3*  PLT 488*  MCV 89.2  MCH 30.8  MCHC 34.5  RDW 13.0  LYMPHSABS 1.0  MONOABS 1.1*  EOSABS 0.1  BASOSABS 0.0    Chemistries   Recent Labs Lab 08/24/14 1300  NA 129*  K 5.7*  CL 97  CO2 25  GLUCOSE 206*  BUN 38*  CREATININE 1.65*  CALCIUM 8.7    Coagulation profile  Recent Labs Lab 08/24/14 1300  INR 1.28     Imaging results:   [place] Abscess Drain  08/24/2014   CLINICAL DATA:  80 year old male with Landry history of recurrent liver abscess.  Recent imaging demonstrates Landry right liver lobe abscess, and he has been  referred for image guided drainage.  EXAM: ULTRASOUND GUIDED DRAIN PLACEMENT OF LIVER ABSCESS  MEDICATIONS: 0.5 mg IV Versed; 25 mcg IV Fentanyl  Total Moderate Sedation Time: 17  PROCEDURE: The procedure, risks, benefits, and alternatives were explained to the patient. Questions regarding the procedure were encouraged and answered. The patient understands and consents to the procedure.  Ultrasound survey of  the right upper quadrant was performed with images stored and sent to PACs.  The right lower chest/upper abdomen was prepped with Betadine in Landry sterile fashion, and Landry sterile drape was applied covering the operative field. Landry sterile gown and sterile gloves were used for the procedure. Local anesthesia was provided with 1% Lidocaine.  Once the patient is prepped and draped sterilely, the skin and subcutaneous tissues were generously infiltrated with 1% lidocaine for local anesthesia. The deep tissues and the liver capsule or also infiltrated generously.  Landry small stab incision was made with an 11 blade scalpel. Soft tissues were spread with Landry sterile clamp.  Using ultrasound guidance, Landry 10.2 French drain was placed directly into the liver abscess using trocar technique. Once we confirmed safe position of the drain tip within the abscess, the catheter was advanced off the needle into the cavity. The needle and stiffener were removed, and an ultrasound image confirmed position of the drain within the abscess cavity.  Approximately 20 cc of fluid was aspirated and sent to the lab for culture.  The patient tolerated the procedure well and remained hemodynamically stable throughout.  No complications were encountered and no significant blood loss was encountered.  The drain was sutured in position, and Landry sterile bandage placed.  The drain was placed to bulb suction.  Estimated blood loss equals 0  COMPLICATIONS: None.  FINDINGS: Ultrasound survey demonstrates hypoechoic region in the right liver lobe compatible with  abscess.  Images during the case demonstrate safe placement of 10.2 French drain into the collection.  IMPRESSION: Status post ultrasound-guided drainage of right liver lobe abscess. Sample of fluid sent to the lab for culture.  Signed,  Yvone Neu. Loreta Ave, DO  [facility]  [facility]   Electronically Signed   By: Gilmer Mor D.O.   On: 08/24/2014 18:15       Assessment & Plan  Active Problems:   Liver abscess   Liver abscess S/P drainage by IR. F/U fluid for cytology, cell count and differential, and culture. We will continue the previous outpatient Cipro Flagyl. Further Abx adjustment based on culture results. Further workup to be done by his primary care physician.  Dehydration As evidenced on physical exam and by mildly increased BUN and creatinine. Likely secondary to acute illness and vomiting. Will give gentle IV fluids for 12 hours.  Hyperkalemia Will give one-time dose of Kayexalate and check Landry bmet later tonight.   Diabetes mellitus. We'll place the patient on sliding scale insulin in-house and resume her medications at discharge.  Hyponatremia Related to dehydration & HCTZ- hold. Brief IV NS. F/U BMP  Chronic Anemia Seems stable. FU CBC  Acute on Stage 3 CKD - due to [facility]. Hold diuretics/ACEI. Brief IVF. FU BMP.  HTN - continue Metoprolol  DVT Prophylaxis: heparin, SCD  AM Labs Ordered, also please review Full Orders  Family Communication:  Dtr at bedside updated by APP. None at bedside now.    Note patient lives at home alone.  Code Status:  Full code  Condition:  Stable but weak.    Time spent in minutes : [address],  PA-C on 08/24/2014 at 6:44 PM  Between 7am to 7pm - Pager - [phone]  After 7pm go to [email or web address] - password [email or web address]  And look for the night coverage person covering me after hours  [facility]  HONGALGI,ANAND, MD, FACP, FHM. [facility] Pager  [phone]  If 7PM-7AM, please contact night-coverage  [email or web address] Password [facility] 08/24/2014, 6:51 PM'

## 2014-08-24 NOTE — H&P (Signed)
[facility] History and Physical                                                                                    Aaron Landry, is Landry 81 y.o. male  MRN: [id number]   DOB - Jun 21, 1924  Admit Date - 08/24/2014  Outpatient Primary MD for the patient is Aaron A, MD  With History of -  Past Medical History  Diagnosis Date  . Diabetes mellitus   . Hypertension   . Hyperlipidemia   . Pleural effusion   . TIA (transient ischemic attack)   . BPH (benign prostatic hyperplasia)   . OA (ocular albinism)   . Chronic kidney disease       Past Surgical History  Procedure Laterality Date  . Hernia repair    . Liver biopsy    . Colon surgery  2011    in for observation after liver abscess drainage by interventional radiology.     HPI  Aaron Landry  is Landry 81 y.o. male, with past medical history of recurrent hepatic abscess, TIA, diabetes mellitus, chronic kidney disease with Landry baseline creatinine of approximately 1.5.  We have been asked by interventional radiology and Aaron Landry to admit this patient overnight for observation after liver abscess drainage.  The patient reports that he has been feeling poorly with decreased appetite, no energy, joint pain for the past 2 weeks. These symptoms are similar to what he has had in the past with his previous 3 liver abscesses. Last Friday 3/4 he went to his primary care physician, Aaron Landry, for his symptoms. An ultrasound showed the abscess. The patient reports that on Monday 3/7 Landry CT scan was performed that showed the abscess. These radiology results are not in EPIC. The patient reports 2 episodes of vomiting over the past 2 weeks, and subjective low-grade fevers, but he denies diarrhea, cough, shortness of breath, significant abdominal pain, clay-colored stools, frank bruising or bleeding. He has started no new medications.  Labs are significant for sodium of 129, potassium 5.7, BUN 38/creatinine 1.65, WBC 12.9, hemoglobin  10.8, and prothrombin time of 16.1.     Review of Systems   In addition to the HPI above,   No Headache, No changes with Vision or hearing, No problems swallowing food or Liquids, No Chest pain, Cough or Shortness of Breath, No Blood in stool or Urine, No dysuria, No new skin rashes or bruises, No new weakness, tingling, numbness in any extremity, No recent weight gain or loss, Landry full 10 point Review of Systems was done, except as stated above, all other Review of Systems were negative.  Social History History  Substance Use Topics  . Smoking status: Former Smoker    Types: Cigarettes    Quit date: 06/16/1981  . Smokeless tobacco: Never Used  . Alcohol Use: No    Family History Family History  Problem Relation Age of Onset  . Alzheimer's disease Mother   . Coronary artery disease Father   . Diabetes Father   . Heart attack Father     Prior to Admission medications   Medication Sig Start Date End Date Taking? Authorizing Provider  aspirin EC 81 MG tablet Take 81 mg by mouth daily.   Yes Historical Provider, MD  Cholecalciferol (VITAMIN D-3 PO) Take by mouth daily.   Yes Historical Provider, MD  ciprofloxacin (CIPRO) 500 MG tablet Take 500 mg by mouth daily. 7 day regimen started 3/4, has taken morning dose 08/18/14  Yes Historical Provider, MD  glipiZIDE (GLUCOTROL) 10 MG tablet Take 10 mg by mouth 2 (two) times daily before Landry meal.   Yes Historical Provider, MD  lisinopril-hydrochlorothiazide (PRINZIDE,ZESTORETIC) 20-12.5 MG per tablet Take 1 tablet by mouth daily.   Yes Historical Provider, MD  metFORMIN (GLUCOPHAGE) 500 MG tablet Take 500 mg by mouth 2 (two) times daily with Landry meal.   Yes Historical Provider, MD  metoprolol tartrate (LOPRESSOR) 25 MG tablet Take 25 mg by mouth daily. 12/12/11  Yes Historical Provider, MD  metroNIDAZOLE (FLAGYL) 500 MG tablet Take 500 mg by mouth 2 (two) times daily. 7 day regimen, started 3/4 and has taken morning dose 08/18/14  Yes  Historical Provider, MD  VITAMIN B COMPLEX-C PO Take by mouth daily.   Yes Historical Provider, MD    Allergies  Allergen Reactions  . Penicillins     Physical Exam  Vitals  Blood pressure 130/67, pulse 90, temperature 98.3 F (36.8 C), resp. rate 15, height 6' (1.829 m), weight 68.04 kg (150 lb), SpO2 97 %.   General:  Thin, elderly gentleman who appears weak. Sitting in chair in short stay. Family is present in the room.  Psych:  Normal affect and insight, Not Suicidal or Homicidal, Awake Alert, Oriented X 3.  Neuro:   No F.N deficits, ALL C.Nerves Intact, Strength 5/5 all 4 extremities, Sensation intact all 4 extremities.  ENT:  Ears and Eyes appear Normal, sclera are anicteric Conjunctivae clear, PER. Moist oral mucosa without erythema or exudates.  Neck:  Supple, No lymphadenopathy appreciated  Respiratory:  Symmetrical chest wall movement, Good air movement bilaterally, CTAB.  Cardiac:  RRR, No Murmurs, no LE edema noted, no JVD.    Abdomen:  Positive bowel sounds, Soft, Non tender, Non distended,  No masses appreciated. No caput medusa, no telangiectasias.  Skin:  No Cyanosis, Normal Skin Turgor, No Skin Rash or Bruise., No jaundice.  Extremities:  Able to move all 4. 5/5 strength in each,  no effusions., No asterixis  Data Review  CBC  Recent Labs Lab 08/24/14 1300  WBC 12.9*  HGB 10.8*  HCT 31.3*  PLT 488*  MCV 89.2  MCH 30.8  MCHC 34.5  RDW 13.0  LYMPHSABS 1.0  MONOABS 1.1*  EOSABS 0.1  BASOSABS 0.0    Chemistries   Recent Labs Lab 08/24/14 1300  NA 129*  K 5.7*  CL 97  CO2 25  GLUCOSE 206*  BUN 38*  CREATININE 1.65*  CALCIUM 8.7    Coagulation profile  Recent Labs Lab 08/24/14 1300  INR 1.28     Imaging results:   No results found.     Assessment & Plan  Principal Problem:   Abscess, liver Active Problems:   Dehydration   Diabetes   Hyperkalemia   Liver abscess To be drained by IR. I have placed orders to  send the fluid for cytology, cell count and differential, and culture. We will continue the previous outpatient Cipro Flagyl. Further workup to be done by his primary care physician.  Dehydration As evidenced on physical exam and by mildly increased BUN and creatinine. Likely secondary to acute illness and vomiting. Will give gentle  IV fluids for 12 hours.  Hyperkalemia Will give one-time dose of Kayexalate and check Landry bmet in the morning.  Diabetes mellitus. We'll place the patient on sliding scale insulin in-house and resume her medications at discharge.  DVT Prophylaxis: heparin, SCD  AM Labs Ordered, also please review Full Orders  Family Communication:  Dtr at bedside.    Note patient lives at home alone.  Code Status:  Full code  Condition:  Stable but weak.    Time spent in minutes : [address],  PA-C on 08/24/2014 at 2:48 PM  Between 7am to 7pm - Pager - [phone]  After 7pm go to [email or web address] - password [email or web address]  And look for the night coverage person covering me after hours  [facility]

## 2014-08-25 DIAGNOSIS — K750 Abscess of liver: Secondary | ICD-10-CM | POA: Diagnosis not present

## 2014-08-25 DIAGNOSIS — N1832 Chronic kidney disease, stage 3b: Secondary | ICD-10-CM | POA: Insufficient documentation

## 2014-08-25 DIAGNOSIS — E118 Type 2 diabetes mellitus with unspecified complications: Secondary | ICD-10-CM

## 2014-08-25 DIAGNOSIS — E1122 Type 2 diabetes mellitus with diabetic chronic kidney disease: Secondary | ICD-10-CM | POA: Insufficient documentation

## 2014-08-25 LAB — CBC
HCT: 28.1 % — ABNORMAL LOW (ref 39.0–52.0)
Hemoglobin: 9.4 g/dL — ABNORMAL LOW (ref 13.0–17.0)
MCH: 30 pg (ref 26.0–34.0)
MCHC: 33.5 g/dL (ref 30.0–36.0)
MCV: 89.8 fL (ref 78.0–100.0)
Platelets: 455 10*3/uL — ABNORMAL HIGH (ref 150–400)
RBC: 3.13 MIL/uL — ABNORMAL LOW (ref 4.22–5.81)
RDW: 13 % (ref 11.5–15.5)
WBC: 9.4 10*3/uL (ref 4.0–10.5)

## 2014-08-25 LAB — COMPREHENSIVE METABOLIC PANEL
ALT: 46 U/L (ref 0–53)
AST: 35 U/L (ref 0–37)
Albumin: 2.4 g/dL — ABNORMAL LOW (ref 3.5–5.2)
Alkaline Phosphatase: 129 U/L — ABNORMAL HIGH (ref 39–117)
Anion gap: 7 (ref 5–15)
BUN: 31 mg/dL — ABNORMAL HIGH (ref 6–23)
CO2: 25 mmol/L (ref 19–32)
Calcium: 8 mg/dL — ABNORMAL LOW (ref 8.4–10.5)
Chloride: 101 mmol/L (ref 96–112)
Creatinine, Ser: 1.41 mg/dL — ABNORMAL HIGH (ref 0.50–1.35)
GFR calc Af Amer: 50 mL/min — ABNORMAL LOW (ref >90)
GFR calc non Af Amer: 43 mL/min — ABNORMAL LOW (ref >90)
Glucose, Bld: 129 mg/dL — ABNORMAL HIGH (ref 70–99)
Potassium: 4.4 mmol/L (ref 3.5–5.1)
Sodium: 133 mmol/L — ABNORMAL LOW (ref 135–145)
Total Bilirubin: 0.6 mg/dL (ref 0.3–1.2)
Total Protein: 5.7 g/dL — ABNORMAL LOW (ref 6.0–8.3)

## 2014-08-25 LAB — BASIC METABOLIC PANEL
Anion gap: 7 (ref 5–15)
BUN: 33 mg/dL — ABNORMAL HIGH (ref 6–23)
CO2: 25 mmol/L (ref 19–32)
Calcium: 8.5 mg/dL (ref 8.4–10.5)
Chloride: 100 mmol/L (ref 96–112)
Creatinine, Ser: 1.46 mg/dL — ABNORMAL HIGH (ref 0.50–1.35)
GFR calc Af Amer: 48 mL/min — ABNORMAL LOW (ref >90)
GFR calc non Af Amer: 41 mL/min — ABNORMAL LOW (ref >90)
Glucose, Bld: 158 mg/dL — ABNORMAL HIGH (ref 70–99)
Potassium: 4.2 mmol/L (ref 3.5–5.1)
Sodium: 132 mmol/L — ABNORMAL LOW (ref 135–145)

## 2014-08-25 LAB — GLUCOSE, CAPILLARY
Glucose-Capillary: 124 mg/dL — ABNORMAL HIGH (ref 70–99)
Glucose-Capillary: 135 mg/dL — ABNORMAL HIGH (ref 70–99)

## 2014-08-25 MED ORDER — TRAMADOL HCL 50 MG PO TABS: 50.0000 mg | ORAL_TABLET | Freq: Four times a day (QID) | ORAL | Status: DC | PRN

## 2014-08-25 MED ORDER — CIPROFLOXACIN HCL 500 MG PO TABS: 500.0000 mg | ORAL_TABLET | Freq: Every day | ORAL | Status: DC

## 2014-08-25 MED ORDER — METRONIDAZOLE 500 MG PO TABS: 500.0000 mg | ORAL_TABLET | Freq: Two times a day (BID) | ORAL | Status: DC

## 2014-08-25 NOTE — Care Management Note (Signed)
CARE MANAGEMENT NOTE 08/25/2014  Patient:  Aaron Landry, Aaron Landry   Account Number:  [id number]  Date Initiated:  08/25/2014  Documentation initiated by:  Bethel Gaglio  Subjective/Objective Assessment:   CM following for progression and d/c planning.     Action/Plan:   08/25/14 Met with pt re [place] needs, per pt he has had drains placed x3 and does not require [place] services.   Anticipated DC Date:  08/25/2014   Anticipated DC Plan:  HOME/SELF CARE         Choice offered to / List presented to:             Status of service:  Completed, signed off Medicare Important Message given?  NO (If response is "NO", the following Medicare IM given date fields will be blank) Date Medicare IM given:   Medicare IM given by:   Date Additional Medicare IM given:   Additional Medicare IM given by:    Discharge Disposition:  HOME/SELF CARE  Per UR Regulation:    If discussed at Long Length of Stay Meetings, dates discussed:    Comments:

## 2014-08-25 NOTE — Progress Notes (Signed)
Referring Physician(s): [name],Richard  Subjective: Patient states he is feeling much better today compared to yesterday with much more energy.   Allergies: Codeine and Penicillins  Medications: Prior to Admission medications   Medication Sig Start Date End Date Taking? Authorizing Provider  aspirin EC 81 MG tablet Take 81 mg by mouth daily.   Yes Historical Provider, MD  ciprofloxacin (CIPRO) 500 MG tablet Take 500 mg by mouth daily. 7 day regimen started 3/4, has taken morning dose 08/18/14  Yes Historical Provider, MD  glipiZIDE (GLUCOTROL XL) 5 MG 24 hr tablet Take 5 mg by mouth 2 (two) times daily.   Yes Historical Provider, MD  glipiZIDE (GLUCOTROL) 10 MG tablet Take 10 mg by mouth 2 (two) times daily before a meal.   Yes Historical Provider, MD  Liniments (SALONPAS EX) Apply 1 application topically daily.   Yes Historical Provider, MD  lisinopril-hydrochlorothiazide (PRINZIDE,ZESTORETIC) 20-12.5 MG per tablet Take 1 tablet by mouth daily.   Yes Historical Provider, MD  metFORMIN (GLUCOPHAGE) 500 MG tablet Take 500 mg by mouth 2 (two) times daily with a meal.   Yes Historical Provider, MD  metoprolol tartrate (LOPRESSOR) 25 MG tablet Take 25 mg by mouth 2 (two) times daily.  12/12/11  Yes Historical Provider, MD  metroNIDAZOLE (FLAGYL) 500 MG tablet Take 500 mg by mouth 2 (two) times daily. 7 day regimen, started 3/4 and has taken morning dose 08/18/14  Yes Historical Provider, MD  Cholecalciferol (VITAMIN D-3 PO) Take by mouth daily.    Historical Provider, MD  VITAMIN B COMPLEX-C PO Take by mouth daily.    Historical Provider, MD   Vital Signs: BP 134/64 mmHg  Pulse 75  Temp(Src) 98.3 F (36.8 C) (Oral)  Resp 17  Wt 152 lb 6.4 oz (69.128 kg)  SpO2 97%  Physical Exam General: A&Ox3, NAD Abd: Soft, NT, ND, RUQ perc drain C/D/I serosang output 40cc/24hr and 75 cc in bulb now  Imaging: [place] Abscess Drain  08/24/2014   CLINICAL DATA:  81 year old male with a history of  recurrent liver abscess.  Recent imaging demonstrates a right liver lobe abscess, and he has been referred for image guided drainage.  EXAM: ULTRASOUND GUIDED DRAIN PLACEMENT OF LIVER ABSCESS  MEDICATIONS: 0.5 mg IV Versed; 25 mcg IV Fentanyl  Total Moderate Sedation Time: 17  PROCEDURE: The procedure, risks, benefits, and alternatives were explained to the patient. Questions regarding the procedure were encouraged and answered. The patient understands and consents to the procedure.  Ultrasound survey of the right upper quadrant was performed with images stored and sent to PACs.  The right lower chest/upper abdomen was prepped with Betadine in a sterile fashion, and a sterile drape was applied covering the operative field. A sterile gown and sterile gloves were used for the procedure. Local anesthesia was provided with 1% Lidocaine.  Once the patient is prepped and draped sterilely, the skin and subcutaneous tissues were generously infiltrated with 1% lidocaine for local anesthesia. The deep tissues and the liver capsule or also infiltrated generously.  A small stab incision was made with an 11 blade scalpel. Soft tissues were spread with a sterile clamp.  Using ultrasound guidance, a 10.2 French drain was placed directly into the liver abscess using trocar technique. Once we confirmed safe position of the drain tip within the abscess, the catheter was advanced off the needle into the cavity. The needle and stiffener were removed, and an ultrasound image confirmed position of the drain within the abscess cavity.  Approximately 20 cc of fluid was aspirated and sent to the lab for culture.  The patient tolerated the procedure well and remained hemodynamically stable throughout.  No complications were encountered and no significant blood loss was encountered.  The drain was sutured in position, and a sterile bandage placed.  The drain was placed to bulb suction.  Estimated blood loss equals 0  COMPLICATIONS: None.   FINDINGS: Ultrasound survey demonstrates hypoechoic region in the right liver lobe compatible with abscess.  Images during the case demonstrate safe placement of 10.2 French drain into the collection.  IMPRESSION: Status post ultrasound-guided drainage of right liver lobe abscess. Sample of fluid sent to the lab for culture.  Signed,  Yvone Neu. Loreta Ave, DO  [facility]  [facility]   Electronically Signed   By: Gilmer Mor D.O.   On: 08/24/2014 18:15   Labs:  CBC:  Recent Labs  08/24/14 1300 08/25/14 0520  WBC 12.9* 9.4  HGB 10.8* 9.4*  HCT 31.3* 28.1*  PLT 488* 455*    COAGS:  Recent Labs  08/24/14 1300  INR 1.28    BMP:  Recent Labs  08/24/14 1300 08/24/14 2329 08/25/14 0520  NA 129* 132* 133*  K 5.7* 4.2 4.4  CL 97 100 101  CO2 GLUCOSE 206* 158* 129*  BUN 38* 33* 31*  CALCIUM 8.7 8.5 8.0*  CREATININE 1.65* 1.46* 1.41*  GFRNONAA 36* 41* 43*  GFRAA 41* 48* 50*    LIVER FUNCTION TESTS:  Recent Labs  08/25/14 0520  BILITOT 0.6  AST 35  ALT 46  ALKPHOS 129*  PROT 5.7*  ALBUMIN 2.4*    Assessment and Plan: Recurrent hepatic abscess S/p perc drain placed 3/10, Cx sent, wbc trend down and wnl today, output 40/24 hrs and 75 cc serosang with purulent stranding in bulb today Patient doing much better since yesterday will be discharged home today and have F/U with Dr. Jacky Kindle next week F/u IR drain clinic 09/06/14 with CT abdomen  Signed: Berneta Levins 08/25/2014, 10:16 AM

## 2014-08-25 NOTE — Discharge Summary (Addendum)
Physician Discharge Summary  BURNS [name]:[id number] DOB: April 21, 1925 DOA: 08/24/2014  PCP: Aaron Meo, MD  Admit date: 08/24/2014 Discharge date: 08/25/2014  Time spent: 35 minutes  Recommendations for Outpatient Follow-up:  1. Follow up with IR 2 week 2. BMP 1 week  Discharge Diagnoses:  Active Problems:   Diabetes   Dehydration   Hyperkalemia   Liver abscess   Discharge Condition: improved  Diet recommendation:   Filed Weights   08/24/14 2125  Weight: 69.128 kg (152 lb 6.4 oz)    History of present illness:  Aaron Landry is a 81 y.o. male, with past medical history of recurrent hepatic abscess, TIA, diabetes mellitus, chronic kidney disease with a baseline creatinine of approximately 1.5. We have been asked by interventional radiology and Dr. Waynard Landry to admit this patient overnight for observation after liver abscess drainage. The patient reports that he has been feeling poorly with decreased appetite, no energy, joint pain for the past 2 weeks. These symptoms are similar to what he has had in the past with his previous 3 liver abscesses. Last Friday 3/4 he went to his primary care physician, Dr. Michail Landry, for his symptoms. An ultrasound showed the abscess. The patient reports that on Monday 3/7 a CT scan was performed that showed the abscess. These radiology results are not in EPIC. The patient reports 2 episodes of vomiting over the past 2 weeks, and subjective low-grade fevers, but he denies diarrhea, cough, shortness of breath, significant abdominal pain, clay-colored stools, frank bruising or bleeding. He has started no new medications.  Labs are significant for sodium of 129, potassium 5.7, BUN 38/creatinine 1.65, WBC 12.9, hemoglobin 10.8, and prothrombin time of 16.1.   Hospital Course:  Liver abscess S/P drainage by IR. 3/10 -await culture -will give 2 weeks of cipro/flagyl until cx back - output 40/24 hrs and 75 cc serosang with purulent  stranding in bulb today Patient doing much better since yesterday will be discharged home today and have F/U with Dr. Jacky Landry next week F/u IR drain clinic 09/06/14 with CT abdomen  Dehydration resolved  Hyperkalemia Will give one-time dose of Kayexalate and check a bmet later tonight.   Diabetes mellitus. We'll place the patient on sliding scale insulin in-house and resume her medications at discharge.  Hyponatremia Improved  BMp outpatient  Chronic Anemia Seems stable. FU CBC  Acute on Stage 3 CKD - stable at baseline  HTN - continue Metoprolol   Procedures:  Drain placement  Consultations:  IR  Discharge Exam: Filed Vitals:   08/25/14 0904  BP: 134/64  Pulse: 75  Temp: 98.3 F (36.8 C)  Resp: 17    General: A+Ox3, NAD- feeling the best he has in a long time Cardiovascular: rrr Respiratory: clear  Discharge Instructions   Discharge Instructions    Diet - low sodium heart healthy    Complete by:  As directed      Diet Carb Modified    Complete by:  As directed      Discharge instructions    Complete by:  As directed   Record output of drain daily Empty as needed (be sure to "charge": squeeze before re-connecting) Do not submerge drain May shower     Increase activity slowly    Complete by:  As directed           Current Discharge Medication List    START taking these medications   Details  traMADol (ULTRAM) 50 MG tablet Take 1 tablet (50 mg  total) by mouth every 6 (six) hours as needed for moderate pain. Qty: 15 tablet, Refills: 0      CONTINUE these medications which have CHANGED   Details  ciprofloxacin (CIPRO) 500 MG tablet Take 1 tablet (500 mg total) by mouth daily. 7 day regimen started 3/4, has taken morning dose Qty: 14 tablet, Refills: 0    metroNIDAZOLE (FLAGYL) 500 MG tablet Take 1 tablet (500 mg total) by mouth 2 (two) times daily. 7 day regimen, started 3/4 and has taken morning dose Qty: 28 tablet, Refills: 0       CONTINUE these medications which have NOT CHANGED   Details  aspirin EC 81 MG tablet Take 81 mg by mouth daily.    glipiZIDE (GLUCOTROL) 10 MG tablet Take 10 mg by mouth 2 (two) times daily before a meal.    Liniments (SALONPAS EX) Apply 1 application topically daily.    lisinopril-hydrochlorothiazide (PRINZIDE,ZESTORETIC) 20-12.5 MG per tablet Take 1 tablet by mouth daily.    metFORMIN (GLUCOPHAGE) 500 MG tablet Take 500 mg by mouth 2 (two) times daily with a meal.    metoprolol tartrate (LOPRESSOR) 25 MG tablet Take 25 mg by mouth 2 (two) times daily.     Cholecalciferol (VITAMIN D-3 PO) Take by mouth daily.    VITAMIN B COMPLEX-C PO Take by mouth daily.      STOP taking these medications     glipiZIDE (GLUCOTROL XL) 5 MG 24 hr tablet        Allergies  Allergen Reactions  . Codeine Itching, Rash and Other (See Comments)    General discomfort  . Penicillins Itching and Rash   Follow-up Information    Follow up with [name],[name], MD In 1 week.   Specialty:  Internal Medicine   Why:  for culture results   Contact information:   [address] [place] [address] [phone]       Follow up with Gilmer Mor, DO Today.   Specialty:  Interventional Radiology   Why:  3/23 with CT abd   Contact information:   [address] [place] [address] [phone]        The results of significant diagnostics from this hospitalization (including imaging, microbiology, ancillary and laboratory) are listed below for reference.    Significant Diagnostic Studies: [place] Abscess Drain  08/24/2014   CLINICAL DATA:  81 year old male with a history of recurrent liver abscess.  Recent imaging demonstrates a right liver lobe abscess, and he has been referred for image guided drainage.  EXAM: ULTRASOUND GUIDED DRAIN PLACEMENT OF LIVER ABSCESS  MEDICATIONS: 0.5 mg IV Versed; 25 mcg IV Fentanyl  Total Moderate Sedation Time: 17  PROCEDURE: The procedure, risks,  benefits, and alternatives were explained to the patient. Questions regarding the procedure were encouraged and answered. The patient understands and consents to the procedure.  Ultrasound survey of the right upper quadrant was performed with images stored and sent to PACs.  The right lower chest/upper abdomen was prepped with Betadine in a sterile fashion, and a sterile drape was applied covering the operative field. A sterile gown and sterile gloves were used for the procedure. Local anesthesia was provided with 1% Lidocaine.  Once the patient is prepped and draped sterilely, the skin and subcutaneous tissues were generously infiltrated with 1% lidocaine for local anesthesia. The deep tissues and the liver capsule or also infiltrated generously.  A small stab incision was made with an 11 blade scalpel. Soft tissues were spread  with a sterile clamp.  Using ultrasound guidance, a 10.2 French drain was placed directly into the liver abscess using trocar technique. Once we confirmed safe position of the drain tip within the abscess, the catheter was advanced off the needle into the cavity. The needle and stiffener were removed, and an ultrasound image confirmed position of the drain within the abscess cavity.  Approximately 20 cc of fluid was aspirated and sent to the lab for culture.  The patient tolerated the procedure well and remained hemodynamically stable throughout.  No complications were encountered and no significant blood loss was encountered.  The drain was sutured in position, and a sterile bandage placed.  The drain was placed to bulb suction.  Estimated blood loss equals 0  COMPLICATIONS: None.  FINDINGS: Ultrasound survey demonstrates hypoechoic region in the right liver lobe compatible with abscess.  Images during the case demonstrate safe placement of 10.2 French drain into the collection.  IMPRESSION: Status post ultrasound-guided drainage of right liver lobe abscess. Sample of fluid sent to the lab  for culture.  Signed,  Yvone NeuJaime S. Loreta AveWagner, DO  [facility]  [facility]   Electronically Signed   By: Gilmer MorJaime  Wagner D.O.   On: 08/24/2014 18:15    Microbiology: Recent Results (from the past 240 hour(s))  Culture, routine-abscess     Status: None (Preliminary result)   Collection Time: 08/24/14  5:30 PM  Result Value Ref Range Status   Specimen Description ABSCESS  Final   Special Requests LIVER  Final   Gram Stain   Final    MODERATE WBC PRESENT,BOTH PMN AND MONONUCLEAR NO SQUAMOUS EPITHELIAL CELLS SEEN MODERATE GRAM POSITIVE COCCI IN PAIRS IN CLUSTERS [facility]    Culture PENDING  Incomplete   Report Status PENDING  Incomplete  Anaerobic culture     Status: None (Preliminary result)   Collection Time: 08/24/14  5:30 PM  Result Value Ref Range Status   Specimen Description ABSCESS  Final   Special Requests LIVER  Final   Gram Stain   Final    MODERATE WBC PRESENT,BOTH PMN AND MONONUCLEAR NO SQUAMOUS EPITHELIAL CELLS SEEN MODERATE GRAM POSITIVE COCCI IN PAIRS IN CLUSTERS Performed at [facility]    Culture PENDING  Incomplete   Report Status PENDING  Incomplete     Labs: Basic Metabolic Panel:  Recent Labs Lab 08/24/14 1300 08/24/14 2329 08/25/14 0520  NA 129* 132* 133*  K 5.7* 4.2 4.4  CL 97 100 101  CO2 25 25 25   GLUCOSE 206* 158* 129*  BUN 38* 33* 31*  CREATININE 1.65* 1.46* 1.41*  CALCIUM 8.7 8.5 8.0*   Liver Function Tests:  Recent Labs Lab 08/25/14 0520  AST 35  ALT 46  ALKPHOS 129*  BILITOT 0.6  PROT 5.7*  ALBUMIN 2.4*   No results for input(s): LIPASE, AMYLASE in the last 168 hours. No results for input(s): AMMONIA in the last 168 hours. CBC:  Recent Labs Lab 08/24/14 1300 08/25/14 0520  WBC 12.9* 9.4  NEUTROABS 10.7*  --   HGB 10.8* 9.4*  HCT 31.3* 28.1*  MCV 89.2 89.8  PLT 488* 455*   Cardiac Enzymes: No results for input(s): CKTOTAL, CKMB, CKMBINDEX,  TROPONINI in the last 168 hours. BNP: BNP (last 3 results) No results for input(s): BNP in the last 8760 hours.  ProBNP (last 3 results) No results for input(s): PROBNP in the last 8760 hours.  CBG:  Recent Labs Lab 08/24/14 1652  08/24/14 2131 08/25/14 0739 08/25/14 1116  GLUCAP 157* 136* 124* 135*       Signed:  Cristofher Livecchi  [facility] 08/25/2014, 11:30 AM

## 2014-08-26 LAB — HEMOGLOBIN A1C
Hgb A1c MFr Bld: 6.6 % — ABNORMAL HIGH (ref 4.8–5.6)
Mean Plasma Glucose: 143 mg/dL

## 2014-08-27 LAB — CULTURE, ROUTINE-ABSCESS

## 2014-08-29 LAB — ANAEROBIC CULTURE

## 2014-09-01 ENCOUNTER — Other Ambulatory Visit (HOSPITAL_COMMUNITY): Payer: Self-pay | Admitting: Interventional Radiology

## 2014-09-01 DIAGNOSIS — K750 Abscess of liver: Secondary | ICD-10-CM

## 2014-09-05 ENCOUNTER — Encounter (HOSPITAL_COMMUNITY): Payer: Self-pay

## 2014-09-05 ENCOUNTER — Ambulatory Visit (HOSPITAL_COMMUNITY)
Admission: RE | Admit: 2014-09-05 | Discharge: 2014-09-05 | Disposition: A | Discharge status: Home / Self Care | Payer: Medicare Other | Source: Ambulatory Visit | Attending: Interventional Radiology | Admitting: Interventional Radiology

## 2014-09-05 DIAGNOSIS — K750 Abscess of liver: Secondary | ICD-10-CM | POA: Diagnosis present

## 2014-09-05 HISTORY — DX: Disorder of kidney and ureter, unspecified: N28.9

## 2014-09-05 MED ORDER — IOHEXOL 300 MG/ML  SOLN
100.0000 mL | Freq: Once | INTRAMUSCULAR | Status: AC | PRN
Start: 2014-09-05 — End: 2014-09-05
  Administered 2014-09-05: 80 mL via INTRAVENOUS

## 2014-09-11 ENCOUNTER — Other Ambulatory Visit (HOSPITAL_COMMUNITY): Payer: Self-pay | Admitting: Internal Medicine

## 2014-09-11 DIAGNOSIS — K750 Abscess of liver: Secondary | ICD-10-CM

## 2014-09-12 ENCOUNTER — Ambulatory Visit (HOSPITAL_COMMUNITY)
Admission: RE | Admit: 2014-09-12 | Discharge: 2014-09-12 | Disposition: A | Discharge status: Home / Self Care | Payer: Medicare Other | Source: Ambulatory Visit | Attending: Internal Medicine | Admitting: Internal Medicine

## 2014-09-12 ENCOUNTER — Ambulatory Visit (HOSPITAL_COMMUNITY)
Admission: RE | Admit: 2014-09-12 | Discharge: 2014-09-12 | Disposition: A | Discharge status: Home / Self Care | Payer: Medicare Other | Source: Ambulatory Visit | Attending: Interventional Radiology | Admitting: Interventional Radiology

## 2014-09-12 ENCOUNTER — Other Ambulatory Visit (HOSPITAL_COMMUNITY): Payer: Self-pay | Admitting: Interventional Radiology

## 2014-09-12 DIAGNOSIS — K750 Abscess of liver: Secondary | ICD-10-CM | POA: Diagnosis present

## 2014-09-12 MED ORDER — IOHEXOL 300 MG/ML  SOLN
50.0000 mL | Freq: Once | INTRAMUSCULAR | Status: AC | PRN
Start: 2014-09-12 — End: 2014-09-12
  Administered 2014-09-12: 50 mL via INTRAVENOUS

## 2014-09-12 NOTE — Procedures (Signed)
HEPATIC ABSCESS RESOLVED BY CT AND FLUORO INJECTION Neg for fistula to any bile duct  No fevers and clear scant output.  No abdominal paiin  Drain removed without difficulty

## 2014-09-29 ENCOUNTER — Other Ambulatory Visit (HOSPITAL_COMMUNITY): Payer: Self-pay | Admitting: Internal Medicine

## 2014-09-29 ENCOUNTER — Encounter (HOSPITAL_COMMUNITY): Payer: Self-pay

## 2014-09-29 ENCOUNTER — Ambulatory Visit (HOSPITAL_COMMUNITY)
Admission: RE | Admit: 2014-09-29 | Discharge: 2014-09-29 | Disposition: A | Discharge status: Home / Self Care | Payer: Medicare Other | Source: Ambulatory Visit | Attending: Internal Medicine | Admitting: Internal Medicine

## 2014-09-29 DIAGNOSIS — K750 Abscess of liver: Secondary | ICD-10-CM

## 2014-10-02 ENCOUNTER — Ambulatory Visit (HOSPITAL_COMMUNITY): Payer: Medicare Other

## 2014-12-13 DIAGNOSIS — Z0000 Encounter for general adult medical examination without abnormal findings: Secondary | ICD-10-CM | POA: Insufficient documentation

## 2015-12-12 IMAGING — XA IR FISTULA/SINUS TRACT
1 series · 13 of 16 positions shown · non-contrast
Comparison: none

CLINICAL DATA: Hepatic abscess, status post percutaneous drainage
and antibiotics. CT demonstrates no significant residual collection.

[Series 1: run · 13 of 16 slices shown]
[im 1/16]
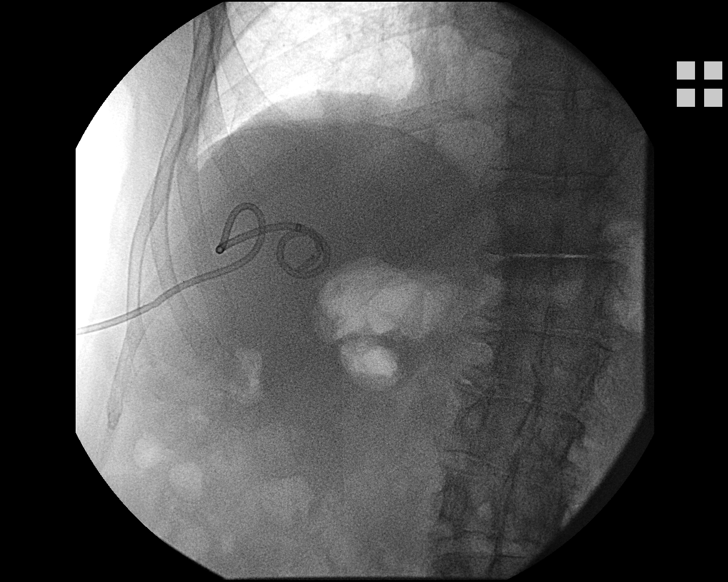
[im 2/16]
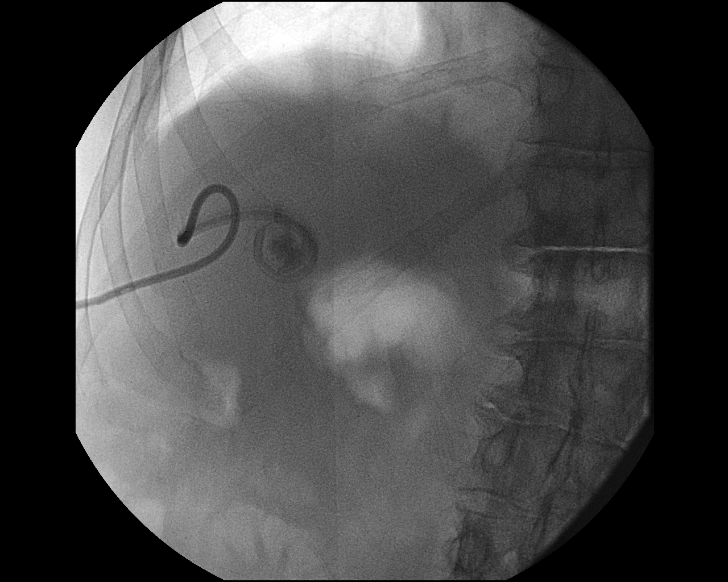
[im 4/16]
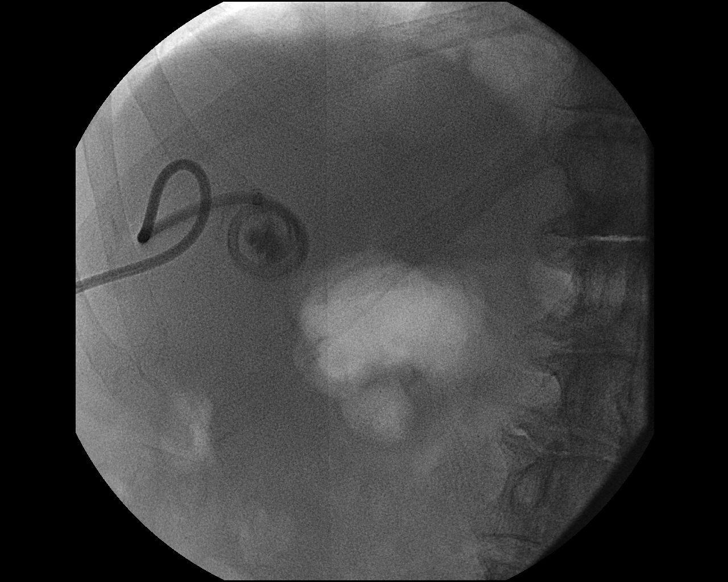
[im 5/16]
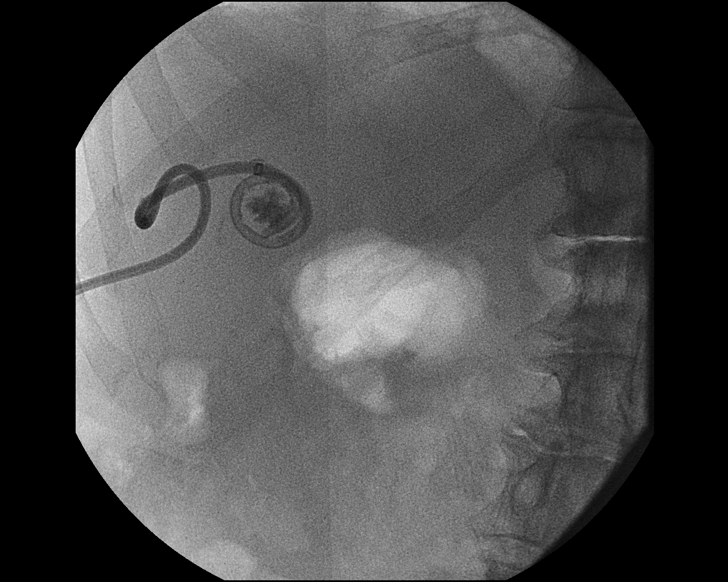
[im 6/16]
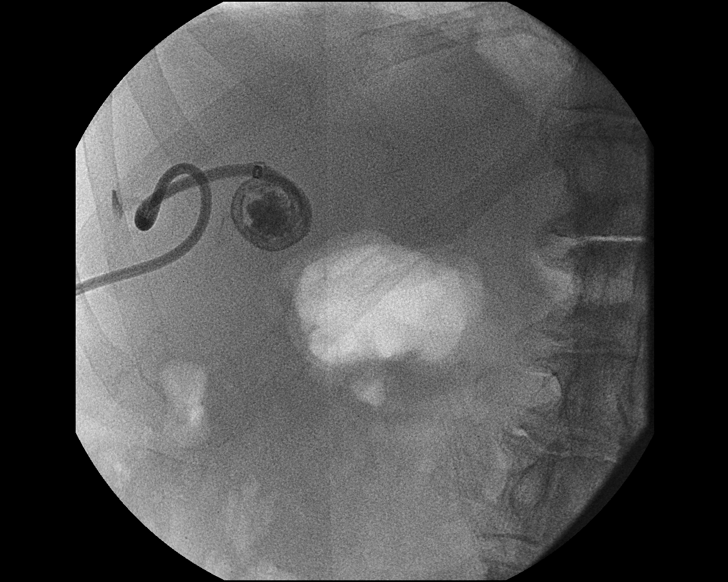
[im 7/16]
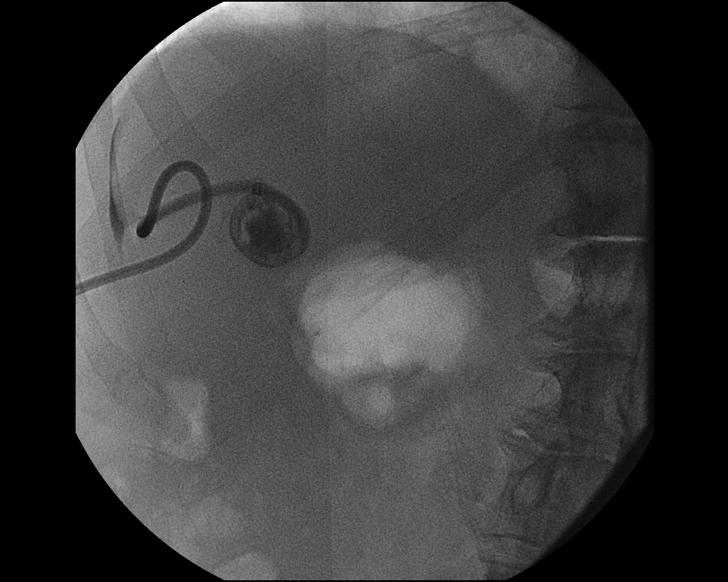
[im 9/16]
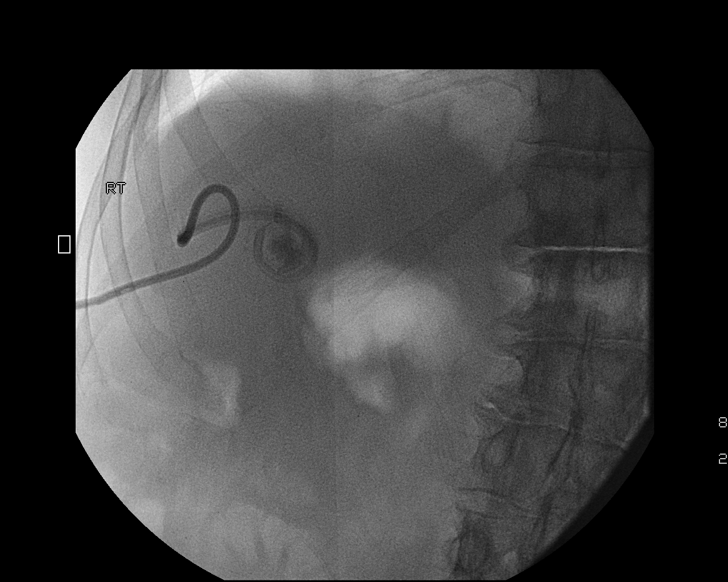
[im 10/16]
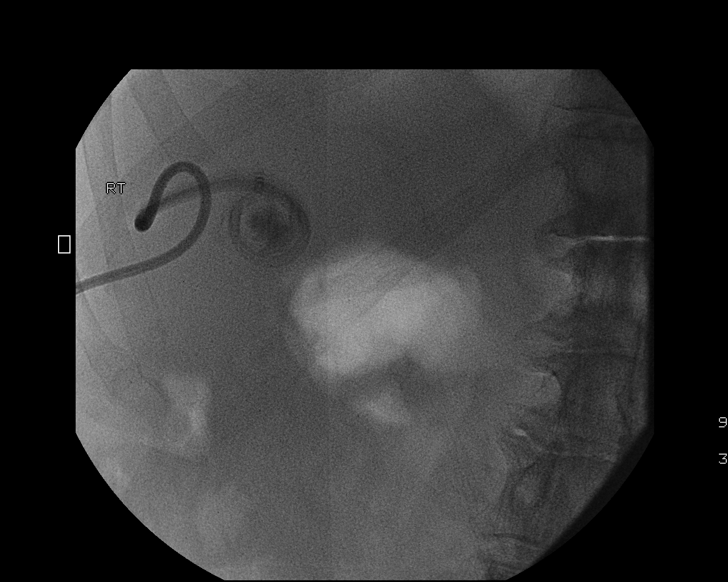
[im 11/16]
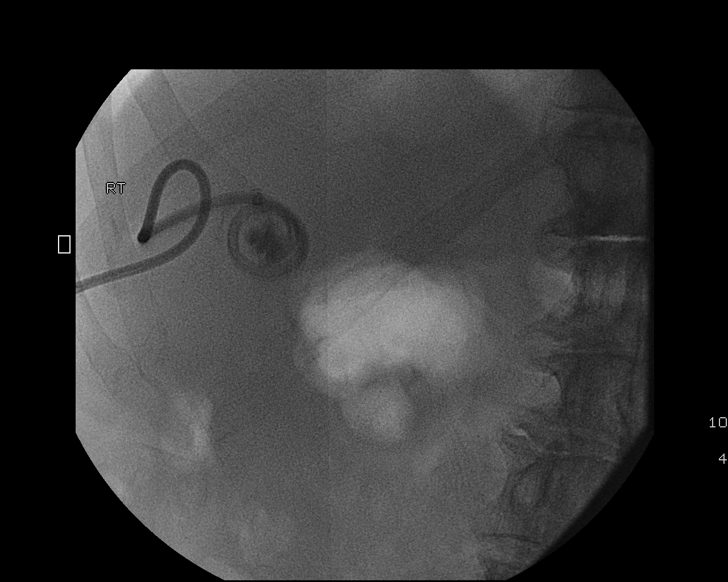
[im 12/16]
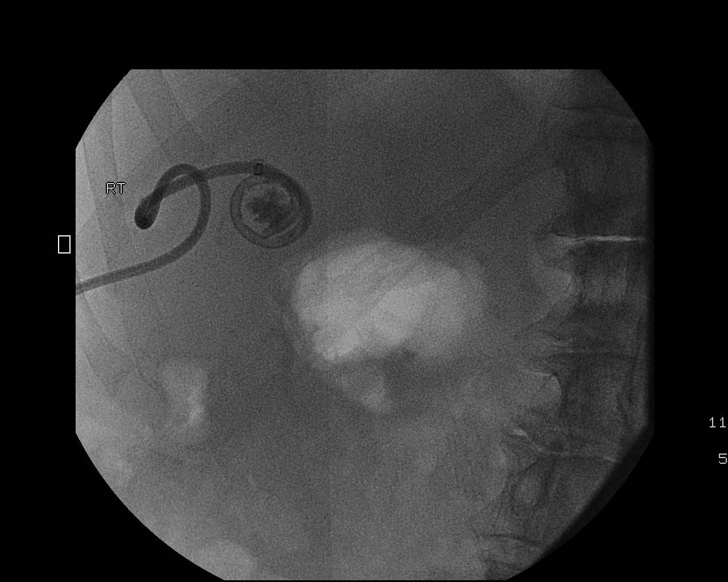
[im 13/16]
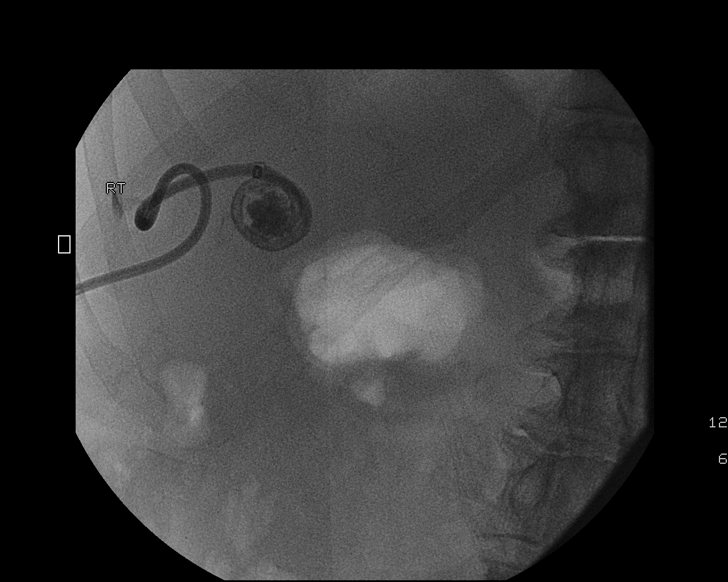
[im 15/16]
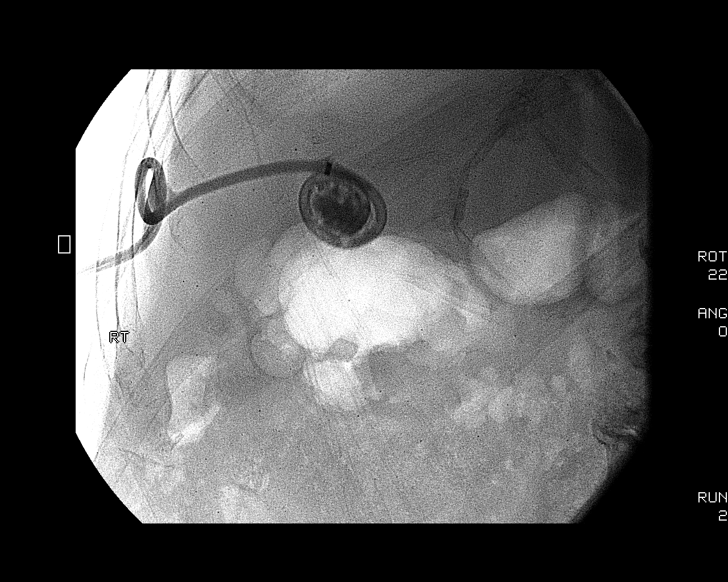
[im 16/16]
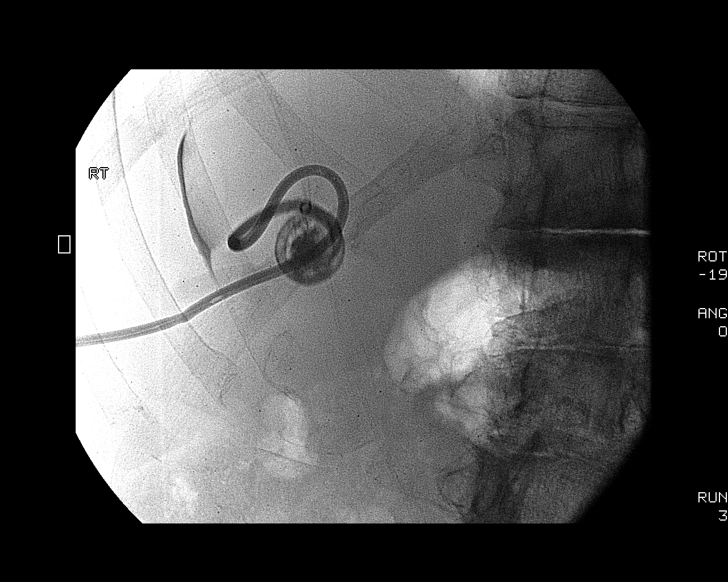

[13 of 16 positions shown; findings below may reference images not displayed]

EXAM:
FLUOROSCOPIC INJECTION OF THE EXISTING RIGHT UPPER QUADRANT HEPATIC
ABSCESS DRAIN

Radiologist:  Koiak, Kiheon

Guidance:  Fluoroscopic

FLUOROSCOPY TIME:  36 seconds, 9 mGy

MEDICATIONS AND MEDICAL HISTORY:
None.

ANESTHESIA/SEDATION:
None.

CONTRAST:  50mL OMNIPAQUE IOHEXOL 300 MG/ML  SOLN .

COMPLICATIONS:
None immediate

PROCEDURE:
Informed consent was obtained from the patient following explanation
of the procedure, risks, benefits and alternatives. The patient
understands, agrees and consents for the procedure. All questions
were addressed. A time out was performed.

Under sterile conditions, the existing abscess drain was injected
with contrast. Fluoroscopic imaging performed. This demonstrates
collapse of the abscess cavity. contrast leaks along the
percutaneous hepatic tract along the lower right liver margin.
Negative for fistula to any adjacent bile duct.

Because of these findings, the drain catheter was removed without
complication.
IMPRESSION: Collapsed abscess cavity.  Negative for bile duct fistula.

Hepatic abscess drain removed.

## 2015-12-29 IMAGING — CT CT ABDOMEN W/O CM
2 of 4 series · 4 of 46 positions shown, 6 images · non-contrast
Comparison: CT abdomen 09/12/2014

CLINICAL DATA: Liver abscess.  Drainage catheter removed 09/12/2014

EXAM:
CT ABDOMEN WITHOUT CONTRAST
TECHNIQUE: Multidetector CT imaging of the abdomen was performed following the
standard protocol without IV contrast.

[Series 205: cor · coronal · 0.50mm/px · 3 of 126 slices shown, 4 images]
[im 28/126  soft-tissue]
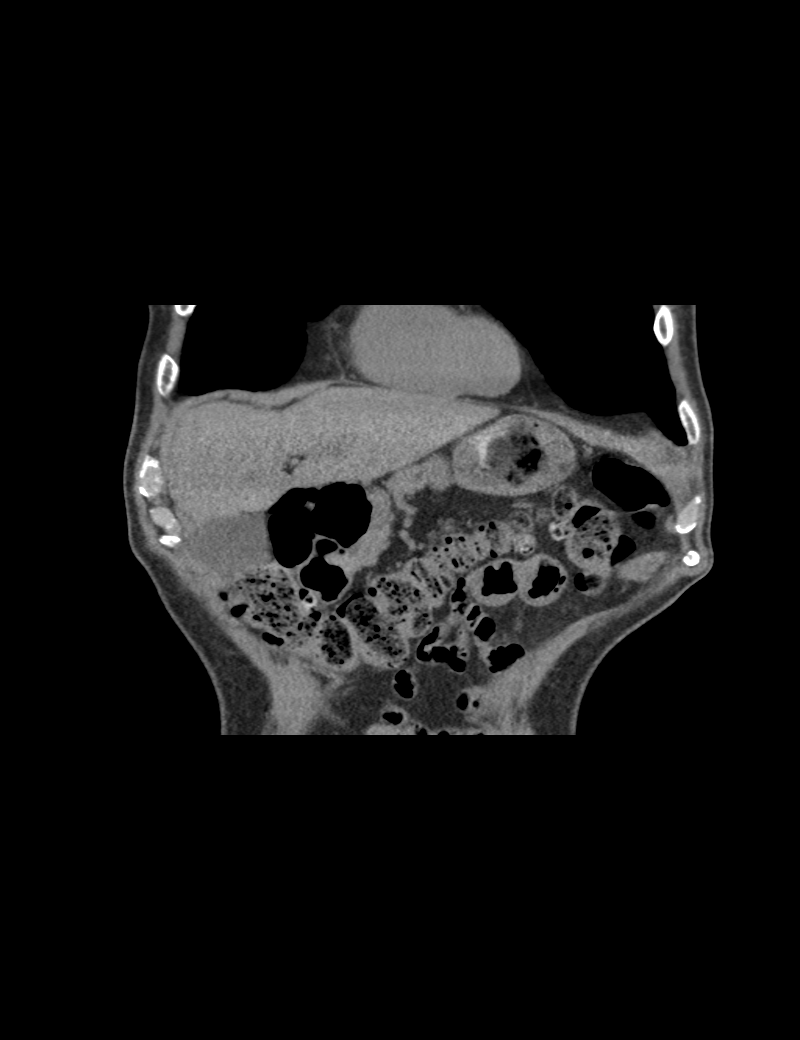
[im 28/126  bone]
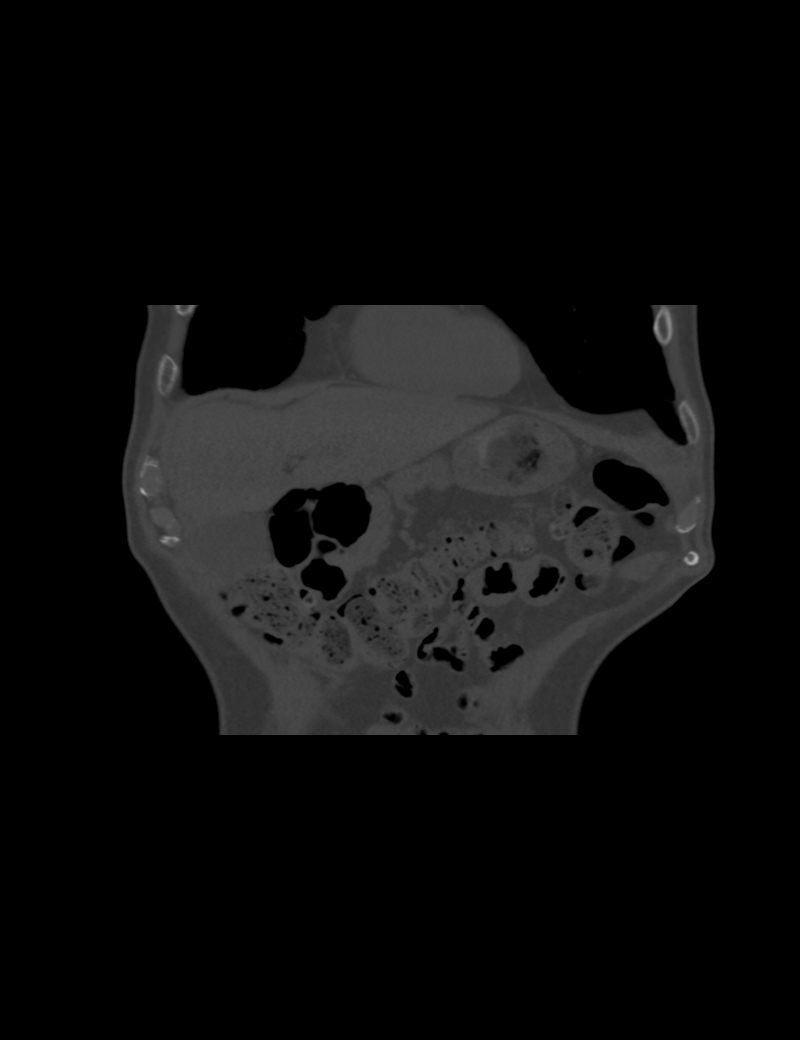
[im 70/126  soft-tissue]
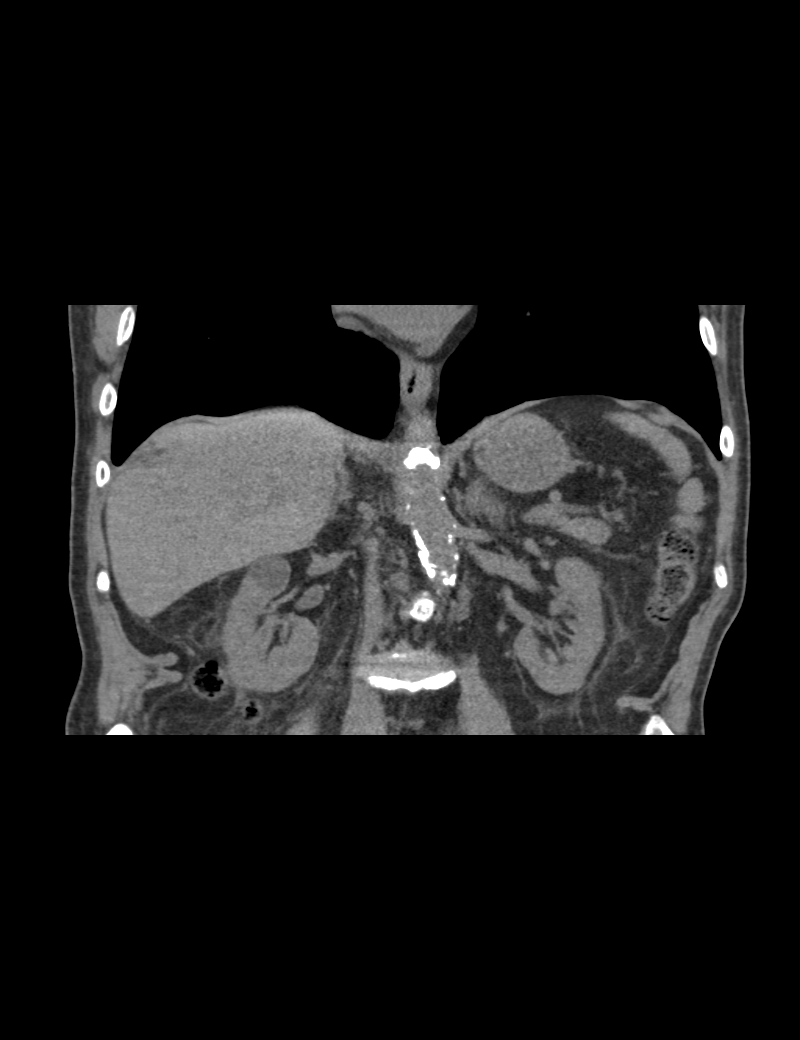
[im 98/126  soft-tissue]
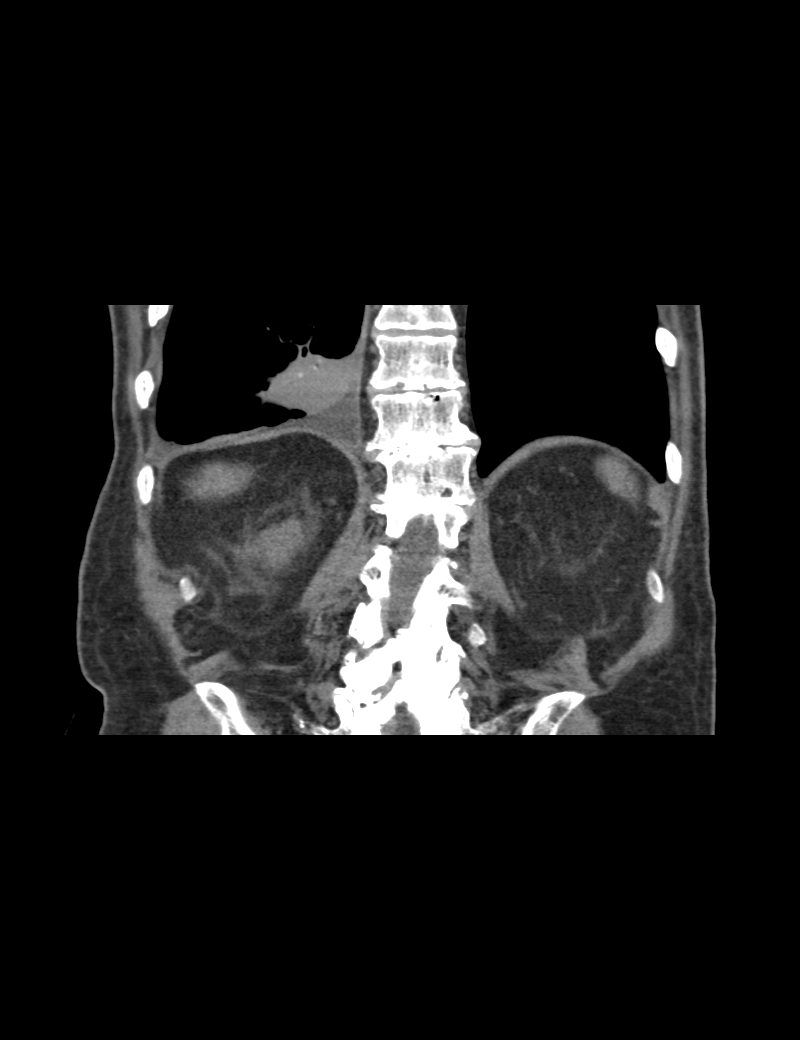

[Series 206: sag · sagittal · 0.50mm/px · 1 of 174 slices shown, 2 images]
[im 58/174  soft-tissue]
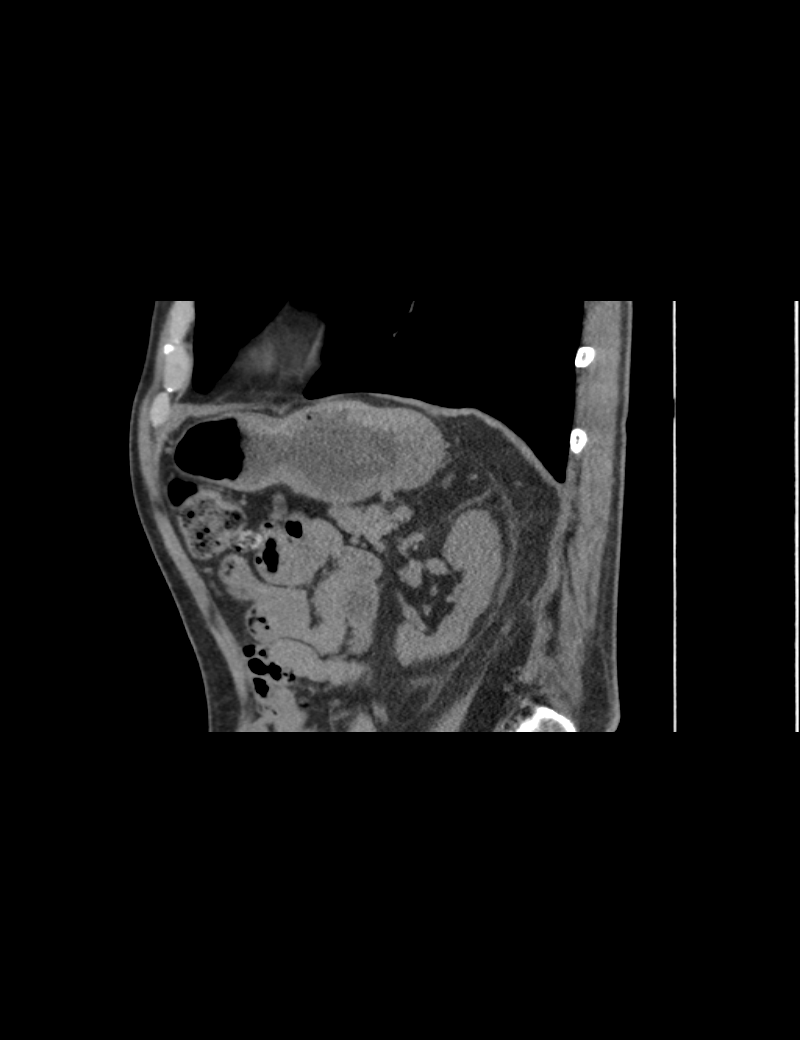
[im 58/174  bone]
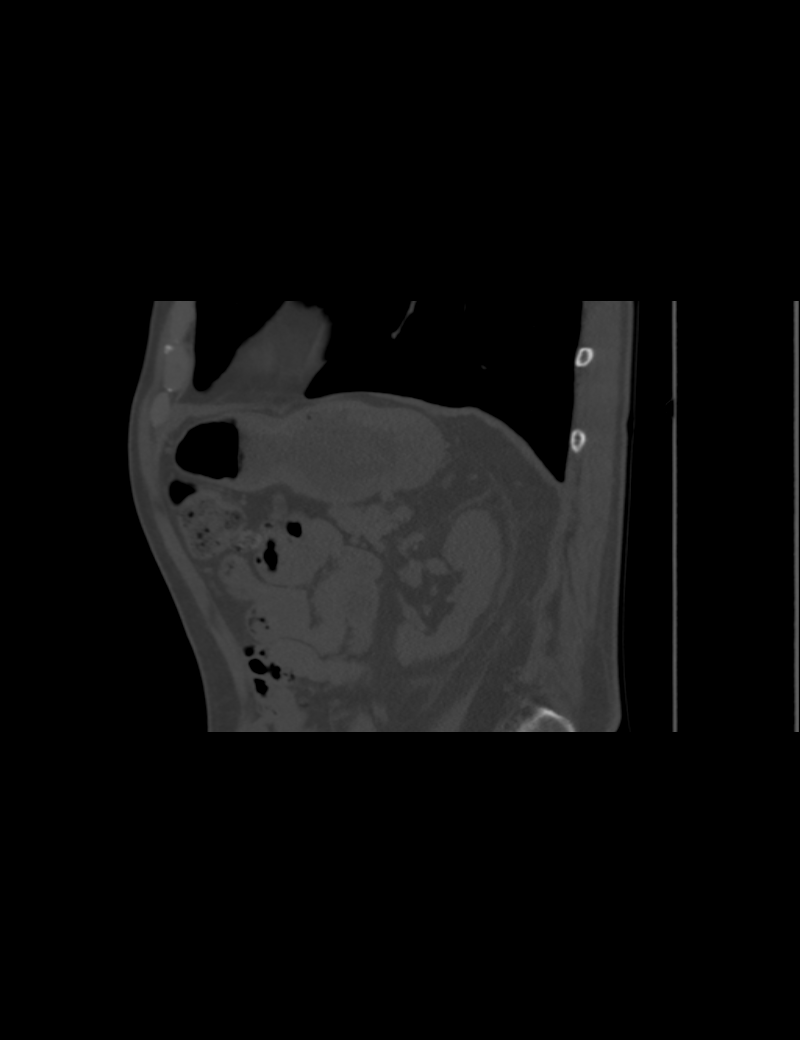

[4 of 46 positions shown; findings below may reference images not displayed]

FINDINGS: Rounded soft tissue density right posterior lung base is stable and
contains a coarse calcification. The soft tissue mass measures 3 by
5.3 cm and may be rounded atelectasis. Minimal pleural effusion is
present on the right. No pneumothorax.

Hepatic drain has been removed. No recurrent fluid collection.
Gallbladder remain normal. Bile ducts are nondilated.

Pancreas and spleen are normal. Kidneys show no renal obstruction or
mass. Right renal cysts unchanged. No renal calculi. Stranding of
the perirenal fat bilaterally is unchanged.

Colonic diverticulosis and retained stool. No bowel obstruction. No
free fluid. No adenopathy.
IMPRESSION: The hepatic drainage catheter has been removed. No recurrent fluid
collection identified in the liver

No free air or free fluid.

Rounded atelectasis right posterior lung base unchanged with a small
right effusion.

## 2016-01-28 DIAGNOSIS — R269 Unspecified abnormalities of gait and mobility: Secondary | ICD-10-CM | POA: Insufficient documentation

## 2016-02-25 ENCOUNTER — Other Ambulatory Visit: Payer: Self-pay | Admitting: Internal Medicine

## 2016-02-25 DIAGNOSIS — K750 Abscess of liver: Secondary | ICD-10-CM

## 2016-02-26 ENCOUNTER — Ambulatory Visit
Admission: RE | Admit: 2016-02-26 | Discharge: 2016-02-26 | Disposition: A | Discharge status: Home / Self Care | Payer: Medicare Other | Source: Ambulatory Visit | Attending: Internal Medicine | Admitting: Internal Medicine

## 2016-02-26 DIAGNOSIS — K750 Abscess of liver: Secondary | ICD-10-CM

## 2016-08-01 ENCOUNTER — Other Ambulatory Visit: Payer: Self-pay | Admitting: Internal Medicine

## 2016-08-01 DIAGNOSIS — K750 Abscess of liver: Secondary | ICD-10-CM

## 2016-08-07 ENCOUNTER — Ambulatory Visit
Admission: RE | Admit: 2016-08-07 | Discharge: 2016-08-07 | Disposition: A | Discharge status: Home / Self Care | Payer: Medicare Other | Source: Ambulatory Visit | Attending: Internal Medicine | Admitting: Internal Medicine

## 2016-08-07 DIAGNOSIS — K750 Abscess of liver: Secondary | ICD-10-CM

## 2017-04-02 ENCOUNTER — Other Ambulatory Visit: Payer: Self-pay | Admitting: Internal Medicine

## 2017-04-02 DIAGNOSIS — K750 Abscess of liver: Secondary | ICD-10-CM

## 2017-10-21 DIAGNOSIS — M25562 Pain in left knee: Secondary | ICD-10-CM | POA: Insufficient documentation

## 2018-11-10 DIAGNOSIS — D692 Other nonthrombocytopenic purpura: Secondary | ICD-10-CM | POA: Insufficient documentation

## 2019-05-16 ENCOUNTER — Other Ambulatory Visit: Payer: Self-pay

## 2019-05-16 DIAGNOSIS — Z20822 Contact with and (suspected) exposure to covid-19: Secondary | ICD-10-CM

## 2019-05-17 LAB — NOVEL CORONAVIRUS, NAA: SARS-CoV-2, NAA: NOT DETECTED

## 2019-07-03 ENCOUNTER — Encounter: Payer: Self-pay | Admitting: *Deleted

## 2019-07-03 ENCOUNTER — Ambulatory Visit: Payer: Medicare Other | Attending: Internal Medicine

## 2019-07-03 DIAGNOSIS — Z23 Encounter for immunization: Secondary | ICD-10-CM | POA: Insufficient documentation

## 2019-07-03 NOTE — Progress Notes (Unsigned)
   Covid-19 Vaccination Clinic  Name:  Aaron Landry    MRN: [id number] DOB: [birth year]  07/03/2019  [name] was observed post Covid-19 immunization for {COVID Vaccine Observation Times:23551} without incidence. He was provided with Vaccine Information Sheet and instruction to access the V-Safe system.   [name] was instructed to call 911 with any severe reactions post vaccine: Marland Kitchen Difficulty breathing  . Swelling of your face and throat  . A fast heartbeat  . A bad rash all over your body  . Dizziness and weakness

## 2019-07-22 ENCOUNTER — Ambulatory Visit: Payer: Medicare Other | Attending: Internal Medicine

## 2019-07-22 DIAGNOSIS — Z23 Encounter for immunization: Secondary | ICD-10-CM | POA: Insufficient documentation

## 2019-07-22 NOTE — Progress Notes (Signed)
   Covid-19 Vaccination Clinic  Name:  Aaron Landry    MRN: [id number] DOB: [birth year]  07/22/2019  [name] was observed post Covid-19 immunization for 15 minutes without incidence. He was provided with Vaccine Information Sheet and instruction to access the V-Safe system.   [name] was instructed to call 911 with any severe reactions post vaccine: Marland Kitchen Difficulty breathing  . Swelling of your face and throat  . A fast heartbeat  . A bad rash all over your body  . Dizziness and weakness    Immunizations Administered    Name Date Dose VIS Date Route   Pfizer COVID-19 Vaccine 07/22/2019  1:40 PM 0.3 mL 05/27/2019 Intramuscular   Manufacturer: [employer], [employer]   Lot: [id number]   NDC: [address]-2

## 2019-10-10 DIAGNOSIS — I129 Hypertensive chronic kidney disease with stage 1 through stage 4 chronic kidney disease, or unspecified chronic kidney disease: Secondary | ICD-10-CM | POA: Insufficient documentation

## 2019-10-10 DIAGNOSIS — N1832 Chronic kidney disease, stage 3b: Secondary | ICD-10-CM | POA: Insufficient documentation

## 2020-06-03 ENCOUNTER — Observation Stay (HOSPITAL_COMMUNITY)
Admission: EM | Admit: 2020-06-03 | Discharge: 2020-06-05 | Disposition: A | Discharge status: Home / Self Care | Payer: Medicare Other | Attending: Internal Medicine | Admitting: Internal Medicine

## 2020-06-03 ENCOUNTER — Observation Stay (HOSPITAL_COMMUNITY): Payer: Medicare Other

## 2020-06-03 ENCOUNTER — Encounter (HOSPITAL_COMMUNITY): Payer: Self-pay

## 2020-06-03 ENCOUNTER — Other Ambulatory Visit: Payer: Self-pay

## 2020-06-03 DIAGNOSIS — N179 Acute kidney failure, unspecified: Secondary | ICD-10-CM | POA: Diagnosis not present

## 2020-06-03 DIAGNOSIS — E119 Type 2 diabetes mellitus without complications: Secondary | ICD-10-CM | POA: Insufficient documentation

## 2020-06-03 DIAGNOSIS — Z7984 Long term (current) use of oral hypoglycemic drugs: Secondary | ICD-10-CM | POA: Insufficient documentation

## 2020-06-03 DIAGNOSIS — Z87891 Personal history of nicotine dependence: Secondary | ICD-10-CM | POA: Insufficient documentation

## 2020-06-03 DIAGNOSIS — G9341 Metabolic encephalopathy: Secondary | ICD-10-CM | POA: Diagnosis not present

## 2020-06-03 DIAGNOSIS — Z79899 Other long term (current) drug therapy: Secondary | ICD-10-CM | POA: Diagnosis not present

## 2020-06-03 DIAGNOSIS — E876 Hypokalemia: Secondary | ICD-10-CM | POA: Diagnosis not present

## 2020-06-03 DIAGNOSIS — E871 Hypo-osmolality and hyponatremia: Secondary | ICD-10-CM | POA: Diagnosis not present

## 2020-06-03 DIAGNOSIS — I129 Hypertensive chronic kidney disease with stage 1 through stage 4 chronic kidney disease, or unspecified chronic kidney disease: Secondary | ICD-10-CM | POA: Diagnosis not present

## 2020-06-03 DIAGNOSIS — R531 Weakness: Secondary | ICD-10-CM | POA: Diagnosis present

## 2020-06-03 DIAGNOSIS — Z20822 Contact with and (suspected) exposure to covid-19: Secondary | ICD-10-CM | POA: Diagnosis not present

## 2020-06-03 DIAGNOSIS — E778 Other disorders of glycoprotein metabolism: Secondary | ICD-10-CM

## 2020-06-03 DIAGNOSIS — E860 Dehydration: Secondary | ICD-10-CM

## 2020-06-03 DIAGNOSIS — J189 Pneumonia, unspecified organism: Secondary | ICD-10-CM

## 2020-06-03 DIAGNOSIS — E1122 Type 2 diabetes mellitus with diabetic chronic kidney disease: Secondary | ICD-10-CM | POA: Diagnosis present

## 2020-06-03 DIAGNOSIS — N390 Urinary tract infection, site not specified: Secondary | ICD-10-CM | POA: Diagnosis not present

## 2020-06-03 DIAGNOSIS — I10 Essential (primary) hypertension: Secondary | ICD-10-CM | POA: Diagnosis present

## 2020-06-03 DIAGNOSIS — N1832 Chronic kidney disease, stage 3b: Secondary | ICD-10-CM | POA: Diagnosis present

## 2020-06-03 DIAGNOSIS — N189 Chronic kidney disease, unspecified: Secondary | ICD-10-CM | POA: Insufficient documentation

## 2020-06-03 DIAGNOSIS — K219 Gastro-esophageal reflux disease without esophagitis: Secondary | ICD-10-CM | POA: Diagnosis present

## 2020-06-03 LAB — CBC
HCT: 34.7 % — ABNORMAL LOW (ref 39.0–52.0)
Hemoglobin: 11.9 g/dL — ABNORMAL LOW (ref 13.0–17.0)
MCH: 31.7 pg (ref 26.0–34.0)
MCHC: 34.3 g/dL (ref 30.0–36.0)
MCV: 92.5 fL (ref 80.0–100.0)
Platelets: 199 10*3/uL (ref 150–400)
RBC: 3.75 MIL/uL — ABNORMAL LOW (ref 4.22–5.81)
RDW: 13.3 % (ref 11.5–15.5)
WBC: 20.3 10*3/uL — ABNORMAL HIGH (ref 4.0–10.5)
nRBC: 0 % (ref 0.0–0.2)

## 2020-06-03 LAB — URINALYSIS, ROUTINE W REFLEX MICROSCOPIC
Bilirubin Urine: NEGATIVE
Glucose, UA: NEGATIVE mg/dL
Ketones, ur: 5 mg/dL — AB
Nitrite: NEGATIVE
Protein, ur: 30 mg/dL — AB
Specific Gravity, Urine: 1.013 (ref 1.005–1.030)
WBC, UA: >50 WBC/hpf — ABNORMAL HIGH (ref 0–5)
pH: 5 (ref 5.0–8.0)

## 2020-06-03 LAB — COMPREHENSIVE METABOLIC PANEL
ALT: 17 U/L (ref 0–44)
AST: 26 U/L (ref 15–41)
Albumin: 3.3 g/dL — ABNORMAL LOW (ref 3.5–5.0)
Alkaline Phosphatase: 71 U/L (ref 38–126)
Anion gap: 16 — ABNORMAL HIGH (ref 5–15)
BUN: 60 mg/dL — ABNORMAL HIGH (ref 8–23)
CO2: 20 mmol/L — ABNORMAL LOW (ref 22–32)
Calcium: 8.2 mg/dL — ABNORMAL LOW (ref 8.9–10.3)
Chloride: 94 mmol/L — ABNORMAL LOW (ref 98–111)
Creatinine, Ser: 2.1 mg/dL — ABNORMAL HIGH (ref 0.61–1.24)
GFR, Estimated: 29 mL/min — ABNORMAL LOW (ref >60)
Glucose, Bld: 172 mg/dL — ABNORMAL HIGH (ref 70–99)
Potassium: 2.9 mmol/L — ABNORMAL LOW (ref 3.5–5.1)
Sodium: 130 mmol/L — ABNORMAL LOW (ref 135–145)
Total Bilirubin: 1.2 mg/dL (ref 0.3–1.2)
Total Protein: 6.7 g/dL (ref 6.5–8.1)

## 2020-06-03 LAB — LACTIC ACID, PLASMA: Lactic Acid, Venous: 1.6 mmol/L (ref 0.5–1.9)

## 2020-06-03 LAB — RESP PANEL BY RT-PCR (FLU A&B, COVID) ARPGX2
Influenza A by PCR: NEGATIVE
Influenza B by PCR: NEGATIVE
SARS Coronavirus 2 by RT PCR: NEGATIVE

## 2020-06-03 LAB — TROPONIN I (HIGH SENSITIVITY): Troponin I (High Sensitivity): 15 ng/L (ref <18)

## 2020-06-03 LAB — C-REACTIVE PROTEIN: CRP: 28.4 mg/dL — ABNORMAL HIGH (ref <1.0)

## 2020-06-03 LAB — GLUCOSE, CAPILLARY: Glucose-Capillary: 182 mg/dL — ABNORMAL HIGH (ref 70–99)

## 2020-06-03 LAB — LIPASE, BLOOD: Lipase: 22 U/L (ref 11–51)

## 2020-06-03 LAB — MAGNESIUM: Magnesium: 1.5 mg/dL — ABNORMAL LOW (ref 1.7–2.4)

## 2020-06-03 LAB — CBG MONITORING, ED: Glucose-Capillary: 157 mg/dL — ABNORMAL HIGH (ref 70–99)

## 2020-06-03 MED ORDER — ENOXAPARIN SODIUM 30 MG/0.3ML ~~LOC~~ SOLN
30.0000 mg | SUBCUTANEOUS | Status: DC
  Administered 2020-06-03 – 2020-06-04 (×2): 30 mg via SUBCUTANEOUS
  Filled 2020-06-03 (×2): qty 0.3

## 2020-06-03 MED ORDER — SODIUM CHLORIDE 0.9 % IV SOLN: INTRAVENOUS | Status: AC

## 2020-06-03 MED ORDER — PANTOPRAZOLE SODIUM 20 MG PO TBEC
20.0000 mg | DELAYED_RELEASE_TABLET | Freq: Every day | ORAL | Status: DC
  Administered 2020-06-03 – 2020-06-05 (×3): 20 mg via ORAL
  Filled 2020-06-03 (×3): qty 1

## 2020-06-03 MED ORDER — POTASSIUM CHLORIDE CRYS ER 20 MEQ PO TBCR
20.0000 meq | EXTENDED_RELEASE_TABLET | Freq: Once | ORAL | Status: AC
  Administered 2020-06-03: 20 meq via ORAL
  Filled 2020-06-03: qty 1

## 2020-06-03 MED ORDER — POTASSIUM CHLORIDE 10 MEQ/100ML IV SOLN
10.0000 meq | Freq: Once | INTRAVENOUS | Status: AC
  Administered 2020-06-03: 10 meq via INTRAVENOUS
  Filled 2020-06-03: qty 100

## 2020-06-03 MED ORDER — METOPROLOL TARTRATE 25 MG PO TABS
25.0000 mg | ORAL_TABLET | Freq: Two times a day (BID) | ORAL | Status: DC
  Administered 2020-06-03 – 2020-06-05 (×4): 25 mg via ORAL
  Filled 2020-06-03 (×4): qty 1

## 2020-06-03 MED ORDER — ACETAMINOPHEN 325 MG PO TABS
650.0000 mg | ORAL_TABLET | Freq: Four times a day (QID) | ORAL | Status: DC | PRN
  Administered 2020-06-03: 650 mg via ORAL
  Filled 2020-06-03: qty 2

## 2020-06-03 MED ORDER — ONDANSETRON HCL 4 MG PO TABS: 4.0000 mg | ORAL_TABLET | Freq: Four times a day (QID) | ORAL | Status: DC | PRN

## 2020-06-03 MED ORDER — ACETAMINOPHEN 650 MG RE SUPP: 650.0000 mg | Freq: Four times a day (QID) | RECTAL | Status: DC | PRN

## 2020-06-03 MED ORDER — SODIUM CHLORIDE 0.9 % IV SOLN
1.0000 g | INTRAVENOUS | Status: DC
  Administered 2020-06-04: 1 g via INTRAVENOUS
  Filled 2020-06-03: qty 1

## 2020-06-03 MED ORDER — MAGNESIUM SULFATE 2 GM/50ML IV SOLN
2.0000 g | Freq: Once | INTRAVENOUS | Status: AC
  Administered 2020-06-03: 2 g via INTRAVENOUS
  Filled 2020-06-03: qty 50

## 2020-06-03 MED ORDER — ONDANSETRON HCL 4 MG/2ML IJ SOLN: 4.0000 mg | Freq: Four times a day (QID) | INTRAMUSCULAR | Status: DC | PRN

## 2020-06-03 MED ORDER — POLYETHYLENE GLYCOL 3350 17 G PO PACK: 17.0000 g | PACK | Freq: Every day | ORAL | Status: DC | PRN

## 2020-06-03 MED ORDER — SODIUM CHLORIDE 0.9 % IV BOLUS
1000.0000 mL | Freq: Once | INTRAVENOUS | Status: AC
  Administered 2020-06-03: 1000 mL via INTRAVENOUS

## 2020-06-03 MED ORDER — POTASSIUM CHLORIDE CRYS ER 20 MEQ PO TBCR
40.0000 meq | EXTENDED_RELEASE_TABLET | Freq: Once | ORAL | Status: AC
  Administered 2020-06-03: 40 meq via ORAL
  Filled 2020-06-03: qty 2

## 2020-06-03 MED ORDER — SODIUM CHLORIDE 0.9 % IV SOLN
1.0000 g | Freq: Once | INTRAVENOUS | Status: AC
  Administered 2020-06-03: 1 g via INTRAVENOUS
  Filled 2020-06-03: qty 10

## 2020-06-03 MED ORDER — INSULIN ASPART 100 UNIT/ML ~~LOC~~ SOLN
0.0000 [IU] | Freq: Three times a day (TID) | SUBCUTANEOUS | Status: DC
  Administered 2020-06-03: 3 [IU] via SUBCUTANEOUS
  Administered 2020-06-04: 2 [IU] via SUBCUTANEOUS
  Administered 2020-06-04: 3 [IU] via SUBCUTANEOUS
  Administered 2020-06-05: 5 [IU] via SUBCUTANEOUS
  Administered 2020-06-05: 2 [IU] via SUBCUTANEOUS
  Filled 2020-06-03: qty 0.15

## 2020-06-03 NOTE — ED Triage Notes (Signed)
Pt reports weakness, nausea, and mild diarrhea x3 days. Pt reports feeling generally unwell. Pt denies any other complaints.

## 2020-06-03 NOTE — ED Notes (Signed)
Report called to Veronica RN

## 2020-06-03 NOTE — ED Provider Notes (Signed)
[facility]-EMERGENCY DEPT Provider Note   CSN: [id number] Arrival date & time: 06/03/20  1302     History Chief Complaint  Patient presents with  . Weakness  . Diarrhea    Aaron Landry is a 86 y.o. male.  HPI He resents for evaluation of a general feeling of ill ease, and decreased appetite for several days.  Patient lives alone.  He is here with his son.  His son is also concerned that the patient's feet need attention, "because he is a diabetic."  There have been no other recent illnesses.  He last saw his PCP about 4 months ago.  He is reportedly taking his usual medicines as prescribed.  There are no other known modifying factors.    Past Medical History:  Diagnosis Date  . BPH (benign prostatic hyperplasia)   . Chronic kidney disease   . Diabetes mellitus   . Hyperlipidemia   . Hypertension   . OA (ocular albinism) (HCC)   . Pleural effusion   . Renal insufficiency   . TIA (transient ischemic attack)     Patient Active Problem List   Diagnosis Date Noted  . Type 2 diabetes mellitus with complication (HCC)   . Hyperkalemia 08/24/2014  . Liver abscess 08/24/2014  . Abscess, liver   . Peripheral vascular disease, unspecified (HCC) 01/27/2012  . ABSCESS, LIVER, PYOGENIC 10/20/2008  . Diabetes (HCC) 10/19/2008  . HYPERTENSION NEC 10/19/2008  . Dehydration 10/08/2008  . HYPOKALEMIA 10/08/2008    Past Surgical History:  Procedure Laterality Date  . COLON SURGERY  2011  . HERNIA REPAIR    . LIVER BIOPSY         Family History  Problem Relation Age of Onset  . Alzheimer's disease Mother   . Coronary artery disease Father   . Diabetes Father   . Heart attack Father     Social History   Tobacco Use  . Smoking status: Former Smoker    Types: Cigarettes    Quit date: 06/16/1981    Years since quitting: 38.9  . Smokeless tobacco: Never Used  Substance Use Topics  . Alcohol use: No  . Drug use: No    Home Medications Prior to  Admission medications   Medication Sig Start Date End Date Taking? Authorizing Provider  lisinopril-hydrochlorothiazide (PRINZIDE,ZESTORETIC) 20-12.5 MG per tablet Take 1 tablet by mouth daily.   Yes [provider]  metFORMIN (GLUCOPHAGE-XR) 500 MG 24 hr tablet Take 500 mg by mouth 2 (two) times daily. 05/30/20  Yes [provider]  metoprolol tartrate (LOPRESSOR) 25 MG tablet Take 25 mg by mouth 2 (two) times daily. 12/12/11  Yes [provider]  pantoprazole (PROTONIX) 20 MG tablet Take 20 mg by mouth daily. 05/30/20  Yes [provider]  traMADol (ULTRAM) 50 MG tablet Take 1 tablet (50 mg total) by mouth every 6 (six) hours as needed for moderate pain. 08/25/14  Yes Marlin Canary U, DO  ciprofloxacin (CIPRO) 500 MG tablet Take 1 tablet (500 mg total) by mouth daily. 7 day regimen started 3/4, has taken morning dose Patient not taking: No sig reported 08/25/14   Joseph Art, DO    Allergies    Codeine and Penicillins  Review of Systems   Review of Systems  All other systems reviewed and are negative.   Physical Exam Updated Vital Signs BP (!) 165/71   Pulse 80   Temp 97.9 F (36.6 C) (Oral)   Resp 16  Ht 6' (1.829 m)   Wt 63.5 kg   SpO2 98%   BMI 18.99 kg/m   Physical Exam Vitals and nursing note reviewed.  Constitutional:      General: He is not in acute distress.    Appearance: He is well-developed and well-nourished. He is not ill-appearing, toxic-appearing or diaphoretic.     Comments: Elderly, frail  HENT:     Head: Normocephalic and atraumatic.     Right Ear: External ear normal.     Left Ear: External ear normal.  Eyes:     Extraocular Movements: EOM normal.     Conjunctiva/sclera: Conjunctivae normal.     Pupils: Pupils are equal, round, and reactive to light.  Neck:     Trachea: Phonation normal.  Cardiovascular:     Rate and Rhythm: Normal rate and regular rhythm.     Heart sounds: Normal heart sounds.  Pulmonary:      Effort: Pulmonary effort is normal.     Breath sounds: Normal breath sounds.  Chest:     Chest wall: No bony tenderness.  Abdominal:     General: There is no distension.     Palpations: Abdomen is soft.     Tenderness: There is no abdominal tenderness.  Musculoskeletal:        General: Normal range of motion.     Cervical back: Normal range of motion and neck supple.  Skin:    General: Skin is warm, dry and intact.     Comments: Toes of both feet, have thickened nails, consistent with chronic fungal disease, and appear generally normal otherwise.  No evident foot injuries or lesions.  Neurological:     Mental Status: He is alert and oriented to person, place, and time.     Cranial Nerves: No cranial nerve deficit.     Sensory: No sensory deficit.     Motor: No abnormal muscle tone.     Coordination: Coordination normal.  Psychiatric:        Mood and Affect: Mood and affect and mood normal.        Behavior: Behavior normal.     ED Results / Procedures / Treatments   Labs (all labs ordered are listed, but only abnormal results are displayed) Labs Reviewed  COMPREHENSIVE METABOLIC PANEL - Abnormal; Notable for the following components:      Result Value   Sodium 130 (*)    Potassium 2.9 (*)    Chloride 94 (*)    CO2 20 (*)    Glucose, Bld 172 (*)    BUN 60 (*)    Creatinine, Ser 2.10 (*)    Calcium 8.2 (*)    Albumin 3.3 (*)    GFR, Estimated 29 (*)    Anion gap 16 (*)    All other components within normal limits  CBC - Abnormal; Notable for the following components:   WBC 20.3 (*)    RBC 3.75 (*)    Hemoglobin 11.9 (*)    HCT 34.7 (*)    All other components within normal limits  URINALYSIS, ROUTINE W REFLEX MICROSCOPIC - Abnormal; Notable for the following components:   APPearance CLOUDY (*)    Hgb urine dipstick MODERATE (*)    Ketones, ur 5 (*)    Protein, ur 30 (*)    Leukocytes,Ua LARGE (*)    WBC, UA >50 (*)    Bacteria, UA MANY (*)    Non Squamous  Epithelial 0-5 (*)    All other  components within normal limits  MAGNESIUM - Abnormal; Notable for the following components:   Magnesium 1.5 (*)    All other components within normal limits  CBG MONITORING, ED - Abnormal; Notable for the following components:   Glucose-Capillary 157 (*)    All other components within normal limits  URINE CULTURE  RESP PANEL BY RT-PCR (FLU A&B, COVID) ARPGX2  LIPASE, BLOOD    EKG EKG Interpretation  Date/Time:  Sunday June 03 2020 13:18:32 EST Ventricular Rate:  88 PR Interval:    QRS Duration: 127 QT Interval:  380 QTC Calculation: 460 R Axis:   -73 Text Interpretation: Sinus tachycardia Multiple premature complexes, vent & supraven RBBB and LAFB Since last tracing rate faster and ectopy is new Otherwise no significant change Confirmed by Mancel Bale [phone]) on 06/03/2020 3:53:27 PM   Radiology No results found.  Procedures .Critical Care Performed by: Mancel Bale, MD Authorized by: Mancel Bale, MD   Critical care provider statement:    Critical care time (minutes):  45   Critical care start time:  06/03/2020 3:50 PM   Critical care end time:  06/03/2020 8:03 PM   Critical care time was exclusive of:  Separately billable procedures and treating other patients   Critical care was necessary to treat or prevent imminent or life-threatening deterioration of the following conditions:  Metabolic crisis   Critical care was time spent personally by me on the following activities:  Blood draw for specimens, development of treatment plan with patient or surrogate, discussions with consultants, evaluation of patient's response to treatment, examination of patient, obtaining history from patient or surrogate, ordering and performing treatments and interventions, ordering and review of laboratory studies, pulse oximetry, re-evaluation of patient's condition, review of old charts and ordering and review of radiographic studies   (including  critical care time)  Medications Ordered in ED Medications  magnesium sulfate IVPB 2 g 50 mL (has no administration in time range)  sodium chloride 0.9 % bolus 1,000 mL (0 mLs Intravenous Stopped 06/03/20 1745)  potassium chloride SA (KLOR-CON) CR tablet 40 mEq (40 mEq Oral Given 06/03/20 1631)  potassium chloride 10 mEq in 100 mL IVPB (0 mEq Intravenous Stopped 06/03/20 1746)  cefTRIAXone (ROCEPHIN) 1 g in sodium chloride 0.9 % 100 mL IVPB (1 g Intravenous New Bag/Given 06/03/20 1824)    ED Course  I have reviewed the triage vital signs and the nursing notes.  Pertinent labs & imaging results that were available during my care of the patient were reviewed by me and considered in my medical decision making (see chart for details).    MDM Rules/Calculators/A&P                           Patient Vitals for the past 24 hrs:  BP Temp Temp src Pulse Resp SpO2 Height Weight  06/03/20 1830 (!) 165/71 -- -- 80 16 98 % -- --  06/03/20 1816 (!) 154/69 -- -- 79 18 99 % -- --  06/03/20 1730 139/79 -- -- 74 16 98 % -- --  06/03/20 1700 (!) 169/91 -- -- 80 16 98 % -- --  06/03/20 1617 (!) 135/57 -- -- 90 16 99 % -- --  06/03/20 1444 (!) 127/58 -- -- 85 18 95 % -- --  06/03/20 1310 (!) 144/54 97.9 F (36.6 C) Oral 90 17 100 % 6' (1.829 m) 63.5 kg    7:10 PM Reevaluation with update and discussion. After  initial assessment and treatment, an updated evaluation reveals he is calm comfortable, 5 discussed with patient and son, all questions answered. Mancel Bale   Medical Decision Making:  This patient is presenting for evaluation of general ill-ease, which does require a range of treatment options, and is a complaint that involves a high risk of morbidity and mortality. The differential diagnoses include acute illness, bacterial infection, metabolic disorder, dehydration. I decided to review old records, and in summary elderly male, living alone, presenting with malaise.  No specific symptoms..   I obtained additional historical information from son at bedside.  Clinical Laboratory Tests Ordered, included CBC, Metabolic panel, Urinalysis and Magnesium level, urine culture. Review indicates magnesium low, sodium low, potassium low, chloride low, CO2 low, glucose high, BUN high, creatinine high, calcium low, albumin low, urinalysis consistent with infection, white count high, hemoglobin low.   Critical Interventions-clinical evaluation, laboratory testing, IV fluids, IV potassium, IV Rocephin, IV magnesium, observation reassessment  After These Interventions, the Patient was reevaluated and was found with multiple medical problems including infection and metabolic disorder requiring hospitalization for management.  He has history of diabetes.  Prior liver abscess but no concern for intra-abdominal abscess at this time.  Significant elevated creatinine, without available baseline labs to compare with.  CRITICAL CARE-yes Performed by: Mancel Bale  Nursing Notes Reviewed/ Care Coordinated Applicable Imaging Reviewed Interpretation of Laboratory Data incorporated into ED treatment   7:12 PM-Consult complete with Hospitalist. Patient case explained and discussed. He agrees to admit patient for further evaluation and treatment. Call ended at 7:45 PM  Plan: Admit    Final Clinical Impression(s) / ED Diagnoses Final diagnoses:  Urinary tract infection without hematuria, site unspecified  Hyponatremia  AKI (acute kidney injury) (HCC)  Hypokalemia  Hypomagnesemia  Hypoproteinemia (HCC)    Rx / DC Orders ED Discharge Orders    None       Mancel Bale, MD 06/03/20 2003

## 2020-06-04 ENCOUNTER — Observation Stay (HOSPITAL_COMMUNITY): Payer: Medicare Other

## 2020-06-04 DIAGNOSIS — N179 Acute kidney failure, unspecified: Secondary | ICD-10-CM

## 2020-06-04 DIAGNOSIS — E860 Dehydration: Secondary | ICD-10-CM | POA: Diagnosis not present

## 2020-06-04 DIAGNOSIS — E1122 Type 2 diabetes mellitus with diabetic chronic kidney disease: Secondary | ICD-10-CM

## 2020-06-04 DIAGNOSIS — I10 Essential (primary) hypertension: Secondary | ICD-10-CM

## 2020-06-04 DIAGNOSIS — E871 Hypo-osmolality and hyponatremia: Secondary | ICD-10-CM

## 2020-06-04 DIAGNOSIS — G9341 Metabolic encephalopathy: Secondary | ICD-10-CM | POA: Diagnosis not present

## 2020-06-04 DIAGNOSIS — N390 Urinary tract infection, site not specified: Secondary | ICD-10-CM | POA: Diagnosis not present

## 2020-06-04 DIAGNOSIS — N1832 Chronic kidney disease, stage 3b: Secondary | ICD-10-CM

## 2020-06-04 DIAGNOSIS — E876 Hypokalemia: Secondary | ICD-10-CM

## 2020-06-04 DIAGNOSIS — K219 Gastro-esophageal reflux disease without esophagitis: Secondary | ICD-10-CM

## 2020-06-04 LAB — COMPREHENSIVE METABOLIC PANEL
ALT: 19 U/L (ref 0–44)
AST: 24 U/L (ref 15–41)
Albumin: 2.8 g/dL — ABNORMAL LOW (ref 3.5–5.0)
Alkaline Phosphatase: 70 U/L (ref 38–126)
Anion gap: 12 (ref 5–15)
BUN: 62 mg/dL — ABNORMAL HIGH (ref 8–23)
CO2: 23 mmol/L (ref 22–32)
Calcium: 8 mg/dL — ABNORMAL LOW (ref 8.9–10.3)
Chloride: 98 mmol/L (ref 98–111)
Creatinine, Ser: 2.08 mg/dL — ABNORMAL HIGH (ref 0.61–1.24)
GFR, Estimated: 29 mL/min — ABNORMAL LOW (ref >60)
Glucose, Bld: 156 mg/dL — ABNORMAL HIGH (ref 70–99)
Potassium: 4.2 mmol/L (ref 3.5–5.1)
Sodium: 133 mmol/L — ABNORMAL LOW (ref 135–145)
Total Bilirubin: 0.9 mg/dL (ref 0.3–1.2)
Total Protein: 5.9 g/dL — ABNORMAL LOW (ref 6.5–8.1)

## 2020-06-04 LAB — CBC WITH DIFFERENTIAL/PLATELET
Abs Immature Granulocytes: 0.1 10*3/uL — ABNORMAL HIGH (ref 0.00–0.07)
Basophils Absolute: 0.1 10*3/uL (ref 0.0–0.1)
Basophils Relative: 0 %
Eosinophils Absolute: 0.1 10*3/uL (ref 0.0–0.5)
Eosinophils Relative: 1 %
HCT: 30.6 % — ABNORMAL LOW (ref 39.0–52.0)
Hemoglobin: 10.3 g/dL — ABNORMAL LOW (ref 13.0–17.0)
Immature Granulocytes: 1 %
Lymphocytes Relative: 7 %
Lymphs Abs: 1 10*3/uL (ref 0.7–4.0)
MCH: 30.7 pg (ref 26.0–34.0)
MCHC: 33.7 g/dL (ref 30.0–36.0)
MCV: 91.1 fL (ref 80.0–100.0)
Monocytes Absolute: 0.9 10*3/uL (ref 0.1–1.0)
Monocytes Relative: 7 %
Neutro Abs: 11.8 10*3/uL — ABNORMAL HIGH (ref 1.7–7.7)
Neutrophils Relative %: 84 %
Platelets: 205 10*3/uL (ref 150–400)
RBC: 3.36 MIL/uL — ABNORMAL LOW (ref 4.22–5.81)
RDW: 13.2 % (ref 11.5–15.5)
WBC: 13.9 10*3/uL — ABNORMAL HIGH (ref 4.0–10.5)
nRBC: 0 % (ref 0.0–0.2)

## 2020-06-04 LAB — GLUCOSE, CAPILLARY
Glucose-Capillary: 145 mg/dL — ABNORMAL HIGH (ref 70–99)
Glucose-Capillary: 111 mg/dL — ABNORMAL HIGH (ref 70–99)
Glucose-Capillary: 158 mg/dL — ABNORMAL HIGH (ref 70–99)
Glucose-Capillary: 118 mg/dL — ABNORMAL HIGH (ref 70–99)

## 2020-06-04 LAB — MAGNESIUM: Magnesium: 1.9 mg/dL (ref 1.7–2.4)

## 2020-06-04 LAB — HEMOGLOBIN A1C
Hgb A1c MFr Bld: 7.4 % — ABNORMAL HIGH (ref 4.8–5.6)
Mean Plasma Glucose: 165.68 mg/dL

## 2020-06-04 MED ORDER — HYDRALAZINE HCL 20 MG/ML IJ SOLN: 10.0000 mg | Freq: Four times a day (QID) | INTRAMUSCULAR | Status: DC | PRN

## 2020-06-04 NOTE — Progress Notes (Addendum)
PROGRESS NOTE  Aaron Landry ZOX:[id number] DOB: 16-May-1925 DOA: 06/03/2020 PCP: Geoffry ParadiseAronson, Richard, MD   LOS: 0 days   Brief narrative: 86 years old male with past medical history of chronic kidney disease stage IIIb, previous TIA, history of liver abscess status post IR drainage (2016), diabetes mellitus type 2, hypertension and gastroesophageal reflux disease presented to hospital with complaint of progressive weakness body aches.  In the ED patient was slightly lethargic and confused.  History from the patient's family indicated that he had been having weakness for 1 week or more with intermittent chills headaches and body aches with dysuria and difficulty initiating urinary stream and lower abdominal pain.  In the ED patient was noted to have substantial leukocytosis at 20.3, he did have acute kidney injury on chronic kidney disease with hyponatremia hypokalemia hypomagnesemia.  Urinalysis was abnormal.  Patient was started on IV Rocephin and was admitted to hospital for further evaluation and treatment.  Assessment/Plan:  Principal Problem:   Complicated UTI (urinary tract infection) Active Problems:   Dehydration with hyponatremia   Hypokalemia, inadequate intake   Type 2 diabetes mellitus with stage 3b chronic kidney disease, without long-term current use of insulin (HCC)   Acute renal failure superimposed on stage 3b chronic kidney disease (HCC)   Hypomagnesemia   Essential hypertension   GERD without esophagitis   Acute metabolic encephalopathy  Complicated urinary tract infection.  On IV Rocephin.  Follow blood cultures, urine cultures IV fluids.  Diabetic diet 28.4.  Lipase 22.  Leukocytosis trending down.  CBC Latest Ref Rng & Units 06/04/2020 06/03/2020 08/25/2014  WBC 4.0 - 10.5 K/uL 13.9(H) 20.3(H) 9.4  Hemoglobin 13.0 - 17.0 g/dL 10.3(L) 11.9(L) 9.4(L)  Hematocrit 39.0 - 52.0 % 30.6(L) 34.7(L) 28.1(L)  Platelets 150 - 400 K/uL 205 199 455(H)     Acute renal failure  superimposed on stage 3b chronic kidney disease  Likely secondary to poor oral intake.  Continue IV fluids.  Check BMP closely.  Check ultrasound of kidney, suprapubic tenderness noted. Get PVR. Likely has BPH. Might need tamsulosin.   Lab Results  Component Value Date   CREATININE 2.08 (H) 06/04/2020   CREATININE 2.10 (H) 06/03/2020   CREATININE 1.41 (H) 08/25/2014     Acute metabolic encephalopathy Patient presented with lethargy and confusion.  Likely secondary to hypovolemia and UTI.  Continue fluids and antibiotics  Volume depletion with hyponatremia On IV fluids.  Sodium of 133 today.  Hypokalemia.  Potassium normal today.  Replace as necessary.  Latest magnesium 1.9  Mild hypomagnesemia.  Improved.  Replenish as necessary.    Type 2 diabetes mellitus with stage 3b chronic kidney disease Continue sliding-scale insulin, Accu-Cheks, diabetic diet.  Hold OHA's.  Hemoglobin A1c of 7.4.    Essential hypertension Continue to hold lisinopril and HCTZ due to electrolyte abnormalities and AKI.  Continue metoprolol.  As needed IV antihypertensives for now    GERD without esophagitis Continue PPI   DVT prophylaxis: enoxaparin (LOVENOX) injection 30 mg Start: 06/03/20 2200 SCDs Start: 06/03/20 2019   Code Status: Full code  Family Communication: I spoke with the patient's son Mr. Jennette Kettleeal on the phone and updated him about the clinical condition of the patient..  At baseline, patient lives alone, independent to ADLs, has son nearby.  Status is: Observation  The patient will require care spanning > 2 midnights and should be moved to inpatient because: IV treatments appropriate due to intensity of illness or inability to take PO, Inpatient level of care  appropriate due to severity of illness and Closer monitoring of electrolytes and renal function, PT evaluation  Dispo: The patient is from: Home              Anticipated d/c is to: Home              Anticipated d/c date is: 2  days              Patient currently is not medically stable to d/c.   Consultants:  None  Procedures:  None  Antibiotics:  . Rocephin IV  Anti-infectives (From admission, onward)   Start     Dose/Rate Route Frequency Ordered Stop   06/04/20 1830  cefTRIAXone (ROCEPHIN) 1 g in sodium chloride 0.9 % 100 mL IVPB        1 g 200 mL/hr over 30 Minutes Intravenous Every 24 hours 06/03/20 2018 06/10/20 1829   06/03/20 1830  cefTRIAXone (ROCEPHIN) 1 g in sodium chloride 0.9 % 100 mL IVPB        1 g 200 mL/hr over 30 Minutes Intravenous  Once 06/03/20 1816 06/03/20 1854     Subjective: Today, patient was seen and examined at bedside. Complains of burning micturition and frequency. No fever or chills, no nausea or vomiting.  Objective: Vitals:   06/04/20 0222 06/04/20 0656  BP: (!) 106/51 (!) 141/60  Pulse: 71 82  Resp: 16 16  Temp: 98 F (36.7 C) 97.8 F (36.6 C)  SpO2: 99% 98%    Intake/Output Summary (Last 24 hours) at 06/04/2020 0758 Last data filed at 06/03/2020 2300 Gross per 24 hour  Intake 1250 ml  Output 225 ml  Net 1025 ml   Filed Weights   06/03/20 1310  Weight: 63.5 kg   Body mass index is 18.99 kg/m.   Physical Exam:  GENERAL: Patient is alert, awake and oriented, thinly built, Not in obvious distress. HENT: No scleral pallor or icterus. Pupils equally reactive to light. Oral mucosa is moist NECK: is supple, no gross swelling noted. CHEST: Decreased breath sounds, no wheezes. CVS: S1 and S2 heard, no murmur. Regular rate and rhythm.  ABDOMEN: Soft, non-tender, bowel sounds are present. Suprapubic tenderness on palpation.  EXTREMITIES: No edema. CNS: Cranial nerves are intact. No focal motor deficits. SKIN: warm and dry without rashes.  Data Review: I have personally reviewed the following laboratory data and studies,  CBC: Recent Labs  Lab 06/03/20 1316 06/04/20 0457  WBC 20.3* 13.9*  NEUTROABS  --  11.8*  HGB 11.9* 10.3*  HCT 34.7* 30.6*   MCV 92.5 91.1  PLT 199 205   Basic Metabolic Panel: Recent Labs  Lab 06/03/20 1316 06/04/20 0457  NA 130* 133*  K 2.9* 4.2  CL 94* 98  CO2 20* 23  GLUCOSE 172* 156*  BUN 60* 62*  CREATININE 2.10* 2.08*  CALCIUM 8.2* 8.0*  MG 1.5* 1.9   Liver Function Tests: Recent Labs  Lab 06/03/20 1316 06/04/20 0457  AST 26 24  ALT 17 19  ALKPHOS 71 70  BILITOT 1.2 0.9  PROT 6.7 5.9*  ALBUMIN 3.3* 2.8*   Recent Labs  Lab 06/03/20 1316  LIPASE 22   No results for input(s): AMMONIA in the last 168 hours. Cardiac Enzymes: No results for input(s): CKTOTAL, CKMB, CKMBINDEX, TROPONINI in the last 168 hours. BNP (last 3 results) No results for input(s): BNP in the last 8760 hours.  ProBNP (last 3 results) No results for input(s): PROBNP in the last 8760 hours.  CBG: Recent Labs  Lab 06/03/20 1321 06/03/20 2238 06/04/20 0734  GLUCAP 157* 182* 145*   Recent Results (from the past 240 hour(s))  Resp Panel by RT-PCR (Flu A&B, Covid) Nasopharyngeal Swab     Status: None   Collection Time: 06/03/20  7:30 PM   Specimen: Nasopharyngeal Swab; Nasopharyngeal(NP) swabs in vial transport medium  Result Value Ref Range Status   SARS Coronavirus 2 by RT PCR NEGATIVE NEGATIVE Final    Comment: (NOTE) SARS-CoV-2 target nucleic acids are NOT DETECTED.  The SARS-CoV-2 RNA is generally detectable in upper respiratory specimens during the acute phase of infection. The lowest concentration of SARS-CoV-2 viral copies this assay can detect is 138 copies/mL. A negative result does not preclude SARS-Cov-2 infection and should not be used as the sole basis for treatment or other patient management decisions. A negative result may occur with  improper specimen collection/handling, submission of specimen other than nasopharyngeal swab, presence of viral mutation(s) within the areas targeted by this assay, and inadequate number of viral copies(<138 copies/mL). A negative result must be combined  with clinical observations, patient history, and epidemiological information. The expected result is Negative.  Fact Sheet for Patients:  BloggerCourse.com  Fact Sheet for Healthcare Providers:  SeriousBroker.it  This test is no t yet approved or cleared by the [place] FDA and  has been authorized for detection and/or diagnosis of SARS-CoV-2 by FDA under an Emergency Use Authorization (EUA). This EUA will remain  in effect (meaning this test can be used) for the duration of the COVID-19 declaration under Section 564(b)(1) of the Act, 21 U.S.C.section 360bbb-3(b)(1), unless the authorization is terminated  or revoked sooner.       Influenza A by PCR NEGATIVE NEGATIVE Final   Influenza B by PCR NEGATIVE NEGATIVE Final    Comment: (NOTE) The Xpert Xpress SARS-CoV-2/FLU/RSV plus assay is intended as an aid in the diagnosis of influenza from Nasopharyngeal swab specimens and should not be used as a sole basis for treatment. Nasal washings and aspirates are unacceptable for Xpert Xpress SARS-CoV-2/FLU/RSV testing.  Fact Sheet for Patients: BloggerCourse.com  Fact Sheet for Healthcare Providers: SeriousBroker.it  This test is not yet approved or cleared by the [place] FDA and has been authorized for detection and/or diagnosis of SARS-CoV-2 by FDA under an Emergency Use Authorization (EUA). This EUA will remain in effect (meaning this test can be used) for the duration of the COVID-19 declaration under Section 564(b)(1) of the Act, 21 U.S.C. section 360bbb-3(b)(1), unless the authorization is terminated or revoked.  Performed at [facility], [address] [place], [place] [address]   Culture, blood (Routine X 2) w Reflex to ID Panel     Status: None (Preliminary result)   Collection Time: 06/03/20  9:38 PM   Specimen: BLOOD  Result Value Ref  Range Status   Specimen Description   Final    BLOOD LEFT ANTECUBITAL Performed at [facility], [address] [place], [place] [address]    Special Requests   Final    BOTTLES DRAWN AEROBIC ONLY Blood Culture adequate volume Performed at [facility], [address] [place], [place] [address]    Culture   Final    NO GROWTH < 12 HOURS Performed at [facility], [address] [place], [place] [address]    Report Status PENDING  Incomplete  Culture, blood (Routine X 2) w Reflex to ID Panel     Status: None (  Preliminary result)   Collection Time: 06/03/20  9:38 PM   Specimen: BLOOD  Result Value Ref Range Status   Specimen Description   Final    BLOOD BLOOD LEFT FOREARM Performed at [place] [facility], [address] [place], [place] [address]    Special Requests   Final    BOTTLES DRAWN AEROBIC ONLY Blood Culture results may not be optimal due to an excessive volume of blood received in culture bottles Performed at [facility], [address] [place], [place] [address]    Culture   Final    NO GROWTH < 12 HOURS Performed at [facility], [address] [place], [place] [address]    Report Status PENDING  Incomplete     Studies: DG Chest 1 View  Result Date: 06/03/2020 CLINICAL DATA:  Weakness and diarrhea. EXAM: CHEST  1 VIEW COMPARISON:  10/08/2008. FINDINGS: The heart size and mediastinal contours are within normal limits. There is a nodular density projecting over the left mid lung zone. There are advanced degenerative changes of both glenohumeral joints, left worse than right. There are intra-articular loose bodies within both glenohumeral joint spaces. IMPRESSION: 1. No acute cardiopulmonary process. 2. Nodular density projecting over the left mid lung zone favored to represent a calcified granuloma. A three-month follow-up two-view chest x-ray is recommended. 3.  Aortic Atherosclerosis  (ICD10-I70.0). Electronically Signed   By: Katherine Mantle M.D.   On: 06/03/2020 20:38     Joycelyn Das, MD  [facility] 06/04/2020  If 7PM-7AM, please contact night-coverage

## 2020-06-04 NOTE — H&P (Signed)
History and Physical    Aaron Landry JJK:[id number] DOB: [birth year] DOA: 06/03/2020  PCP: Geoffry Paradise, MD  Patient coming from: Home   Chief Complaint:  Chief Complaint  Patient presents with  . Weakness  . Diarrhea     HPI:    Year old male with past medical history of chronic kidney disease stage IIIb, previous TIA, history of liver abscess status post IR drainage (2016), diabetes mellitus type 2, hypertension and gastroesophageal reflux disease who presents to [facility] emergency department due to progressive weakness and body aches.  Patient is a somewhat poor historian due to lethargy and confusion.  History is been obtained both from the patient as well as the son who is at the bedside.  For approximately 1 week, the patient has been experiencing progressively worsening weakness.  Weakness was initially mild in intensity but progressively became more and more severe.  Weakness is associated with intermittent chills, headaches as well as generalized body aches.  Patient states that as the symptoms worsened he noticed that he was also experiencing dysuria with difficulty initiating urinary stream as well as lower abdominal pain.  Patient describes lower abdominal pain as sharp in quality, mild to moderate intensity and nonradiating.  Patient symptoms continue to worsen over the next several days to the patient eventually presented to [facility] emergency department for evaluation.  Upon evaluation in the emergency department patient was found to have a substantial leukocytosis of 20.3 as well as evidence of acute kidney injury superimposed on chronic kidney disease and multiple electrolyte abnormalities including hyponatremia and hypokalemia with hypomagnesia.  Urinalysis was suggestive of a urinary tract infection.  Patient was initiated on intravenous ceftriaxone by the emergency department staff and initiated on IV fluids.  The hospitalist group was then  called to assess the patient for admission the hospital.  Review of Systems:   Review of Systems  Constitutional: Positive for malaise/fatigue.  Gastrointestinal: Positive for abdominal pain and nausea.  Genitourinary: Positive for dysuria and frequency.  Neurological: Positive for weakness.  All other systems reviewed and are negative.   Past Medical History:  Diagnosis Date  . BPH (benign prostatic hyperplasia)   . Chronic kidney disease   . Diabetes mellitus   . Hyperlipidemia   . Hypertension   . OA (ocular albinism) (HCC)   . Pleural effusion   . Renal insufficiency   . TIA (transient ischemic attack)     Past Surgical History:  Procedure Laterality Date  . COLON SURGERY  2011  . HERNIA REPAIR    . LIVER BIOPSY       reports that he quit smoking about 38 years ago. His smoking use included cigarettes. He has never used smokeless tobacco. He reports that he does not drink alcohol and does not use drugs.  Allergies  Allergen Reactions  . Codeine Itching, Rash and Other (See Comments)    General discomfort  . Penicillins Itching and Rash    Family History  Problem Relation Age of Onset  . Alzheimer's disease Mother   . Coronary artery disease Father   . Diabetes Father   . Heart attack Father      Prior to Admission medications   Medication Sig Start Date End Date Taking? Authorizing Provider  lisinopril-hydrochlorothiazide (PRINZIDE,ZESTORETIC) 20-12.5 MG per tablet Take 1 tablet by mouth daily.   Yes [provider]  metFORMIN (GLUCOPHAGE-XR) 500 MG 24 hr tablet Take 500 mg by mouth 2 (two) times daily. 05/30/20  Yes [provider]  metoprolol tartrate (LOPRESSOR) 25 MG tablet Take 25 mg by mouth 2 (two) times daily. 12/12/11  Yes [provider]  pantoprazole (PROTONIX) 20 MG tablet Take 20 mg by mouth daily. 05/30/20  Yes [provider]  traMADol (ULTRAM) 50 MG tablet Take 1 tablet (50 mg total) by mouth every 6 (six)  hours as needed for moderate pain. 08/25/14  Yes Marlin Canary U, DO  ciprofloxacin (CIPRO) 500 MG tablet Take 1 tablet (500 mg total) by mouth daily. 7 day regimen started 3/4, has taken morning dose Patient not taking: No sig reported 08/25/14   Joseph Art, DO    Physical Exam: Vitals:   06/03/20 2000 06/03/20 2030 06/03/20 2133 06/04/20 0222  BP: 98/87 (!) 114/99 (!) 159/89 (!) 106/51  Pulse: 85 85 88 71  Resp: 16 18 16 16   Temp:   98.1 F (36.7 C) 98 F (36.7 C)  TempSrc:   Oral Oral  SpO2: 100% 99% 100% 99%  Weight:      Height:        Constitutional: Lethargic but arousable, oriented x3.  No distress.  Skin: no rashes, no lesions, extremely poor skin turgor noted.   Eyes: Pupils are equally reactive to light.  No evidence of scleral icterus or conjunctival pallor.  ENMT: Dry mucous membranes noted.  Posterior pharynx clear of any exudate or lesions.   Neck: normal, supple, no masses, no thyromegaly.  No evidence of jugular venous distension.   Respiratory: Notable scattered rhonchi bilaterally without any evidence of rales or wheezing.  Normal respiratory effort. No accessory muscle use.  Cardiovascular: Regular rate and rhythm, no murmurs / rubs / gallops. No extremity edema. 2+ pedal pulses. No carotid bruits.  Chest:   Nontender without crepitus or deformity.   Back:   Nontender without crepitus or deformity. Abdomen: Lower abdomen is notably tender however soft.  Notable suprapubic fullness on palpation.  Positive bowel sounds noted in all quadrants.   Musculoskeletal: No joint deformity upper and lower extremities. Good ROM, no contractures. Normal muscle tone.  Neurologic: Patient is lethargic but arousable and oriented x3.   Sensation intact.  Patient moving all 4 extremities spontaneously.  Patient is following all commands.  Patient is responsive to verbal stimuli.   Psychiatric: Patient exhibits normal mood with appropriate affect.  Patient currently does not seem  to possess insight as to his current situation  Labs on Admission: I have personally reviewed following labs and imaging studies -   CBC: Recent Labs  Lab 06/03/20 1316  WBC 20.3*  HGB 11.9*  HCT 34.7*  MCV 92.5  PLT 199   Basic Metabolic Panel: Recent Labs  Lab 06/03/20 1316  NA 130*  K 2.9*  CL 94*  CO2 20*  GLUCOSE 172*  BUN 60*  CREATININE 2.10*  CALCIUM 8.2*  MG 1.5*   GFR: Estimated Creatinine Clearance: 19.7 mL/min (A) (by C-G formula based on SCr of 2.1 mg/dL (H)). Liver Function Tests: Recent Labs  Lab 06/03/20 1316  AST 26  ALT 17  ALKPHOS 71  BILITOT 1.2  PROT 6.7  ALBUMIN 3.3*   Recent Labs  Lab 06/03/20 1316  LIPASE 22   No results for input(s): AMMONIA in the last 168 hours. Coagulation Profile: No results for input(s): INR, PROTIME in the last 168 hours. Cardiac Enzymes: No results for input(s): CKTOTAL, CKMB, CKMBINDEX, TROPONINI in the last 168 hours. BNP (last 3 results) No results for input(s): PROBNP in  the last 8760 hours. HbA1C: Recent Labs    06/03/20 1316  HGBA1C 7.4*   CBG: Recent Labs  Lab 06/03/20 1321 06/03/20 2238  GLUCAP 157* 182*   Lipid Profile: No results for input(s): CHOL, HDL, LDLCALC, TRIG, CHOLHDL, LDLDIRECT in the last 72 hours. Thyroid Function Tests: No results for input(s): TSH, T4TOTAL, FREET4, T3FREE, THYROIDAB in the last 72 hours. Anemia Panel: No results for input(s): VITAMINB12, FOLATE, FERRITIN, TIBC, IRON, RETICCTPCT in the last 72 hours. Urine analysis:    Component Value Date/Time   COLORURINE YELLOW 06/03/2020 1635   APPEARANCEUR CLOUDY (A) 06/03/2020 1635   LABSPEC 1.013 06/03/2020 1635   PHURINE 5.0 06/03/2020 1635   GLUCOSEU NEGATIVE 06/03/2020 1635   HGBUR MODERATE (A) 06/03/2020 1635   BILIRUBINUR NEGATIVE 06/03/2020 1635   KETONESUR 5 (A) 06/03/2020 1635   PROTEINUR 30 (A) 06/03/2020 1635   UROBILINOGEN 1.0 10/08/2008 1721   NITRITE NEGATIVE 06/03/2020 1635   LEUKOCYTESUR  LARGE (A) 06/03/2020 1635    Radiological Exams on Admission - Personally Reviewed: DG Chest 1 View  Result Date: 06/03/2020 CLINICAL DATA:  Weakness and diarrhea. EXAM: CHEST  1 VIEW COMPARISON:  10/08/2008. FINDINGS: The heart size and mediastinal contours are within normal limits. There is a nodular density projecting over the left mid lung zone. There are advanced degenerative changes of both glenohumeral joints, left worse than right. There are intra-articular loose bodies within both glenohumeral joint spaces. IMPRESSION: 1. No acute cardiopulmonary process. 2. Nodular density projecting over the left mid lung zone favored to represent a calcified granuloma. A three-month follow-up two-view chest x-ray is recommended. 3.  Aortic Atherosclerosis (ICD10-I70.0). Electronically Signed   By: Katherine Mantle M.D.   On: 06/03/2020 20:38    EKG: Personally reviewed.  Rhythm is normal sinus rhythm with heart rate of 88 bpm.  Evidence of right bundle branch block.    Assessment/Plan Principal Problem:   Complicated UTI (urinary tract infection)   Patient presenting with urinary tract infection, presumably secondary to a cystitis with a gram-negative organism that is resulting in concurrent acute kidney injury and acute metabolic encephalopathy  Patient has been initiated on intravenous ceftriaxone by the emergency department staff which will be continued for now  Blood and urine cultures have been obtained  Hydrating patient aggressively with intravenous isotonic fluids  We will de-escalate antibiotic regimen based on results of cultures.  If patient does not rapidly clinically improve will obtain CT imaging of the abdomen pelvis.  Due to suprapubic abdominal fullness palpated on examination will obtain bladder scan to ensure there is no degree of urinary retention  Active Problems:   Acute renal failure superimposed on stage 3b chronic kidney disease (HCC)   Felt to be prerenal due  to poor oral intake as of late with superimposed urinary tract infection  Hydrating patient with intravenous isotonic fluids  Strict input and output monitoring  Monitoring renal function and electrolytes with serial chemistries  Attempting to avoid nephrotoxic agents if at all possible    Acute metabolic encephalopathy   Patient exhibiting substantial lethargy and bouts of confusion throughout the interview concerning for acute metabolic encephalopathy  This is likely secondary to underlying urinary tract infection as well as hypovolemia  Hydrating patient with intravenous isotonic fluids  Treating underlying infection with intravenous antibiotics  If this does not quickly resolve will expand encephalopathy work-up.    Dehydration with hyponatremia   Clinically patient is volume depleted with a mild hyponatremia likely secondary to poor oral  intake  Hydrating patient with intravenous isotonic fluids  Monitoring sodium levels with serial chemistries    Hypokalemia, inadequate intake   Replacing potassium with oral supplements  Monitoring potassium levels with serial chemistries  Correcting concurrent hypomagnesemia    Type 2 diabetes mellitus with stage 3b chronic kidney disease, without long-term current use of insulin (HCC)   Holding home regimen of oral hypoglycemics  Hemoglobin A1c pending  Accu-Cheks before every meal and nightly with sliding scale insulin    Hypomagnesemia   Replacing with intravenous magnesium sulfate  Monitoring magnesium levels with serial chemistries    Essential hypertension   Temporarily holding lisinopril and hydrochlorothiazide due to acute kidney injury and electrolyte abnormalities  Continuing home regimen of metoprolol  As needed intravenous antihypertensives for markedly elevated blood pressures    GERD without esophagitis    Continue home regimen of daily PPI   Code Status:  Full code Family Communication:  deferred   Status is: Observation  The patient remains OBS appropriate and will d/c before 2 midnights.  Dispo: The patient is from: Home              Anticipated d/c is to: Home              Anticipated d/c date is: 2 days              Patient currently is not medically stable to d/c.        Marinda ElkGeorge J Shirleyann Montero MD [facility] Pager [phone]  If 7PM-7AM, please contact night-coverage [email or web address] Use universal [place] password for that web site. If you do not have the password, please call the hospital operator.  06/04/2020, 2:30 AM

## 2020-06-05 DIAGNOSIS — N390 Urinary tract infection, site not specified: Secondary | ICD-10-CM | POA: Diagnosis not present

## 2020-06-05 DIAGNOSIS — N179 Acute kidney failure, unspecified: Secondary | ICD-10-CM | POA: Diagnosis not present

## 2020-06-05 DIAGNOSIS — E860 Dehydration: Secondary | ICD-10-CM | POA: Diagnosis not present

## 2020-06-05 DIAGNOSIS — G9341 Metabolic encephalopathy: Secondary | ICD-10-CM | POA: Diagnosis not present

## 2020-06-05 LAB — CBC
HCT: 29.5 % — ABNORMAL LOW (ref 39.0–52.0)
Hemoglobin: 9.9 g/dL — ABNORMAL LOW (ref 13.0–17.0)
MCH: 30.7 pg (ref 26.0–34.0)
MCHC: 33.6 g/dL (ref 30.0–36.0)
MCV: 91.6 fL (ref 80.0–100.0)
Platelets: 206 10*3/uL (ref 150–400)
RBC: 3.22 MIL/uL — ABNORMAL LOW (ref 4.22–5.81)
RDW: 13.2 % (ref 11.5–15.5)
WBC: 11.1 10*3/uL — ABNORMAL HIGH (ref 4.0–10.5)
nRBC: 0 % (ref 0.0–0.2)

## 2020-06-05 LAB — GLUCOSE, CAPILLARY
Glucose-Capillary: 112 mg/dL — ABNORMAL HIGH (ref 70–99)
Glucose-Capillary: 205 mg/dL — ABNORMAL HIGH (ref 70–99)
Glucose-Capillary: 122 mg/dL — ABNORMAL HIGH (ref 70–99)

## 2020-06-05 LAB — BASIC METABOLIC PANEL
Anion gap: 11 (ref 5–15)
BUN: 48 mg/dL — ABNORMAL HIGH (ref 8–23)
CO2: 22 mmol/L (ref 22–32)
Calcium: 8.2 mg/dL — ABNORMAL LOW (ref 8.9–10.3)
Chloride: 99 mmol/L (ref 98–111)
Creatinine, Ser: 1.57 mg/dL — ABNORMAL HIGH (ref 0.61–1.24)
GFR, Estimated: 41 mL/min — ABNORMAL LOW (ref >60)
Glucose, Bld: 124 mg/dL — ABNORMAL HIGH (ref 70–99)
Potassium: 3.6 mmol/L (ref 3.5–5.1)
Sodium: 132 mmol/L — ABNORMAL LOW (ref 135–145)

## 2020-06-05 LAB — MAGNESIUM: Magnesium: 1.8 mg/dL (ref 1.7–2.4)

## 2020-06-05 LAB — URINE CULTURE: Culture: >=100000 — AB

## 2020-06-05 MED ORDER — LISINOPRIL-HYDROCHLOROTHIAZIDE 20-12.5 MG PO TABS: 1.0000 | ORAL_TABLET | Freq: Every day | ORAL | Status: DC

## 2020-06-05 MED ORDER — CEPHALEXIN 500 MG PO CAPS
500.0000 mg | ORAL_CAPSULE | Freq: Two times a day (BID) | ORAL | 0 refills | Status: AC
Start: 2020-06-05 — End: 2020-06-10

## 2020-06-05 MED ORDER — CEPHALEXIN 500 MG PO CAPS
500.0000 mg | ORAL_CAPSULE | Freq: Two times a day (BID) | ORAL | Status: DC
  Administered 2020-06-05: 500 mg via ORAL
  Filled 2020-06-05: qty 1

## 2020-06-05 MED ORDER — TAMSULOSIN HCL 0.4 MG PO CAPS
0.4000 mg | ORAL_CAPSULE | Freq: Every day | ORAL | Status: DC
  Administered 2020-06-05: 0.4 mg via ORAL
  Filled 2020-06-05: qty 1

## 2020-06-05 MED ORDER — TAMSULOSIN HCL 0.4 MG PO CAPS: 0.4000 mg | ORAL_CAPSULE | Freq: Every day | ORAL | 2 refills | Status: DC

## 2020-06-05 MED ORDER — POLYETHYLENE GLYCOL 3350 17 G PO PACK: 17.0000 g | PACK | Freq: Every day | ORAL | 0 refills | Status: DC | PRN

## 2020-06-05 NOTE — Discharge Summary (Signed)
Physician Discharge Summary  Aaron Landry ZOX:[id number] DOB: 1924-11-24 DOA: 06/03/2020  PCP: Geoffry Paradise, MD  Admit date: 06/03/2020 Discharge date: 06/05/2020  Admitted From: Home  Discharge disposition: Home   Recommendations for Outpatient Follow-Up:   . Follow up with your primary care provider in one week.  . Check CBC, BMP, magnesium in the next visit . Patient has been initiated on tamsulosin for possible BPH.  Urology evaluation if urinary retention.   Discharge Diagnosis:   Principal Problem:   Complicated UTI (urinary tract infection) Active Problems:   Dehydration with hyponatremia   Hypokalemia, inadequate intake   Type 2 diabetes mellitus with stage 3b chronic kidney disease, without long-term current use of insulin (HCC)   Acute renal failure superimposed on stage 3b chronic kidney disease (HCC)   Hypomagnesemia   Essential hypertension   GERD without esophagitis   Acute metabolic encephalopathy   Discharge Condition: Improved.  Diet recommendation: Low sodium, heart healthy.  Carbohydrate-modified.    Wound care: None.  Code status: Full.   History of Present Illness:   86 years old male with past medical history of chronic kidney disease stage IIIb, previous TIA, history of liver abscess status post IR drainage(2016),diabetes mellitus type 2, hypertension and gastroesophageal reflux disease presented to hospital with complaint of progressive weakness body aches.  In the ED patient was slightly lethargic and confused.  History was obtained from the patient's family who indicated that he had been having weakness for 1 week or more with intermittent chills headaches and body aches with dysuria and difficulty initiating urinary stream and lower abdominal pain.  In the ED, patient was noted to have substantial leukocytosis at 20.3, he did have acute kidney injury on chronic kidney disease with hyponatremia hypokalemia hypomagnesemia.  Urinalysis  was abnormal.  Patient was started on IV Rocephin and was admitted to hospital for further evaluation and treatment.   Hospital Course:   Following conditions were addressed during hospitalization as listed below,  Complicated E. coli urinary tract infection with intermittent urinary retention. Likely with underlying BPH.   Patient was initiated on IV Rocephin.   Urine culture showing pansensitive E. coli. Changed to Keflex on discharge for five more days to complete 7-day course.. Leukocytosis trending down.  Patient will require in and out cath during hospitalization due to frequency of urination but.  Patient was started on tamsulosin.  This will be continued on discharge.  Acute renal failure superimposed on stage 3b chronic kidney disease  Received IV fluids.   Creatinine improved with IV hydration.  Trended down to 1.5 from 2.1. Lab Results  Component Value Date   CREATININE 1.57 (H) 06/05/2020   CREATININE 2.08 (H) 06/04/2020   CREATININE 2.10 (H) 06/03/2020    Acute metabolic encephalopathy Patient presented with lethargy and confusion.  Likely secondary to hypovolemia and UTI.  Received IV fluids and antibiotics.  Currently at baseline mentation.  Alert awake oriented  Volume depletion with hyponatremia Resolved received IV fluids.  Sodium of 132 today.  Hypokalemia..  Potassium prior to discharge was 3.6.  Mild hypomagnesemia.  Improved.  Magnesium level prior to discharge was 1.8  Type 2 diabetes mellitus with stage 3b chronic kidney disease On Metformin at home will be resumed on discharge.  Hemoglobin A1c of 7.4.  Essential hypertension Continue to hold lisinopril and HCTZ  for few days due to electrolyte imbalance and AKI.  Continue metoprolol  GERD without esophagitis Continue PPI  Disposition.  At this time, patient  is stable for disposition home with outpatient PCP follow-up.  Spoke with the patient's son on the phone and updated him about the plan  for disposition.  Medical Consultants:    None.  Procedures:    In and out cath Subjective:   Today, patient was seen and examined at bedside.  Has mild dysuria.  Denies any fever chills nausea vomiting feels overall improved.  Discharge Exam:   Vitals:   06/05/20 0454 06/05/20 1317  BP: (!) 154/81 (!) 167/81  Pulse: 71 70  Resp: 18 (!) 22  Temp: 98.2 F (36.8 C) (!) 97.5 F (36.4 C)  SpO2: 98% 100%   Vitals:   06/04/20 1306 06/04/20 2011 06/05/20 0454 06/05/20 1317  BP: (!) 158/78 (!) 143/75 (!) 154/81 (!) 167/81  Pulse: 72 74 71 70  Resp:  18 18 (!) 22  Temp:  98.7 F (37.1 C) 98.2 F (36.8 C) (!) 97.5 F (36.4 C)  TempSrc:   Oral Oral  SpO2:  99% 98% 100%  Weight:      Height:       General: Alert awake, not in obvious distress, thinly built HENT: pupils equally reacting to light,  No scleral pallor or icterus noted. Oral mucosa is moist.  Chest:  Clear breath sounds.  Diminished breath sounds bilaterally. No crackles or wheezes.  CVS: S1 &S2 heard. No murmur.  Regular rate and rhythm. Abdomen: Soft, nontender, nondistended.  Bowel sounds are heard.   Extremities: No cyanosis, clubbing or edema.  Peripheral pulses are palpable. Psych: Alert, awake and oriented, normal mood CNS:  No cranial nerve deficits.  Power equal in all extremities.   Skin: Warm and dry.  No rashes noted.  The results of significant diagnostics from this hospitalization (including imaging, microbiology, ancillary and laboratory) are listed below for reference.     Diagnostic Studies:   DG Chest 1 View  Result Date: 06/03/2020 CLINICAL DATA:  Weakness and diarrhea. EXAM: CHEST  1 VIEW COMPARISON:  10/08/2008. FINDINGS: The heart size and mediastinal contours are within normal limits. There is a nodular density projecting over the left mid lung zone. There are advanced degenerative changes of both glenohumeral joints, left worse than right. There are intra-articular loose bodies within  both glenohumeral joint spaces. IMPRESSION: 1. No acute cardiopulmonary process. 2. Nodular density projecting over the left mid lung zone favored to represent a calcified granuloma. A three-month follow-up two-view chest x-ray is recommended. 3.  Aortic Atherosclerosis (ICD10-I70.0). Electronically Signed   By: Katherine Mantlehristopher  Green M.D.   On: 06/03/2020 20:38   US RENAL  Result Date: 06/04/2020 CLINICAL DATA:  UTI EXAM: RENAL / URINARY TRACT ULTRASOUND COMPLETE COMPARISON:  08/07/2016 FINDINGS: Right Kidney: Renal measurements: 11.7 x 4.6 x 5.6 cm = volume: 155 mL. Cysts are noted within the right kidney the largest of which measures 2.4 cm in the mid to lower pole. These are simple in nature. Mild cortical thinning is noted. Left Kidney: Renal measurements: 10.3 x 5.1 x 5.0 cm = volume: 138 mL. Cortical thinning is noted. 2.2 cm cyst is noted in the midportion of left kidney with increased echogenicity within. This is similar to that seen on prior exam from 2018 but slightly larger in size. Bladder: Appears normal for degree of bladder distention. Other: None. IMPRESSION: Bilateral renal cysts are noted. There simple in nature on the right. Slight increase in size in the left renal cyst is noted with slight increased echogenicity within. Given the patient's age, short-term follow-up in  6-12 months is recommended to assess for stability. Electronically Signed   By: Alcide Clever M.D.   On: 06/04/2020 12:42     Labs:   Basic Metabolic Panel: Recent Labs  Lab 06/03/20 1316 06/04/20 0457 06/05/20 0514  NA 130* 133* 132*  K 2.9* 4.2 3.6  CL 94* 98 99  CO2 20* 23 22  GLUCOSE 172* 156* 124*  BUN 60* 62* 48*  CREATININE 2.10* 2.08* 1.57*  CALCIUM 8.2* 8.0* 8.2*  MG 1.5* 1.9 1.8   GFR Estimated Creatinine Clearance: 26.4 mL/min (A) (by C-G formula based on SCr of 1.57 mg/dL (H)). Liver Function Tests: Recent Labs  Lab 06/03/20 1316 06/04/20 0457  AST 26 24  ALT 17 19  ALKPHOS 71 70   BILITOT 1.2 0.9  PROT 6.7 5.9*  ALBUMIN 3.3* 2.8*   Recent Labs  Lab 06/03/20 1316  LIPASE 22   No results for input(s): AMMONIA in the last 168 hours. Coagulation profile No results for input(s): INR, PROTIME in the last 168 hours.  CBC: Recent Labs  Lab 06/03/20 1316 06/04/20 0457 06/05/20 0514  WBC 20.3* 13.9* 11.1*  NEUTROABS  --  11.8*  --   HGB 11.9* 10.3* 9.9*  HCT 34.7* 30.6* 29.5*  MCV 92.5 91.1 91.6  PLT 199 205 206   Cardiac Enzymes: No results for input(s): CKTOTAL, CKMB, CKMBINDEX, TROPONINI in the last 168 hours. BNP: Invalid input(s): POCBNP CBG: Recent Labs  Lab 06/04/20 1615 06/04/20 2132 06/05/20 0725 06/05/20 1129 06/05/20 1702  GLUCAP 158* 118* 112* 205* 122*   D-Dimer No results for input(s): DDIMER in the last 72 hours. Hgb A1c Recent Labs    06/03/20 1316  HGBA1C 7.4*   Lipid Profile No results for input(s): CHOL, HDL, LDLCALC, TRIG, CHOLHDL, LDLDIRECT in the last 72 hours. Thyroid function studies No results for input(s): TSH, T4TOTAL, T3FREE, THYROIDAB in the last 72 hours.  Invalid input(s): FREET3 Anemia work up No results for input(s): VITAMINB12, FOLATE, FERRITIN, TIBC, IRON, RETICCTPCT in the last 72 hours. Microbiology Recent Results (from the past 240 hour(s))  Urine culture     Status: Abnormal   Collection Time: 06/03/20  4:35 PM   Specimen: Urine, Clean Catch  Result Value Ref Range Status   Specimen Description   Final    URINE, CLEAN CATCH Performed at [facility], [address], [place], [place] [address]    Special Requests   Final    NONE Performed at [facility], [address], [place], [place] [address]    Culture >=100,000 COLONIES/mL ESCHERICHIA COLI (A)  Final   Report Status 06/05/2020 FINAL  Final   Organism ID, Bacteria ESCHERICHIA COLI (A)  Final      Susceptibility   Escherichia coli - MIC*    AMPICILLIN 4 SENSITIVE Sensitive     CEFAZOLIN <=4  SENSITIVE Sensitive     CEFEPIME <=0.12 SENSITIVE Sensitive     CEFTRIAXONE <=0.25 SENSITIVE Sensitive     CIPROFLOXACIN <=0.25 SENSITIVE Sensitive     GENTAMICIN <=1 SENSITIVE Sensitive     IMIPENEM 0.5 SENSITIVE Sensitive     NITROFURANTOIN <=16 SENSITIVE Sensitive     TRIMETH/SULFA <=20 SENSITIVE Sensitive     AMPICILLIN/SULBACTAM <=2 SENSITIVE Sensitive     PIP/TAZO <=4 SENSITIVE Sensitive     * >=100,000 COLONIES/mL ESCHERICHIA COLI  Resp Panel by RT-PCR (Flu A&B, Covid) Nasopharyngeal Swab     Status: None   Collection Time: 06/03/20  7:30 PM  Specimen: Nasopharyngeal Swab; Nasopharyngeal(NP) swabs in vial transport medium  Result Value Ref Range Status   SARS Coronavirus 2 by RT PCR NEGATIVE NEGATIVE Final    Comment: (NOTE) SARS-CoV-2 target nucleic acids are NOT DETECTED.  The SARS-CoV-2 RNA is generally detectable in upper respiratory specimens during the acute phase of infection. The lowest concentration of SARS-CoV-2 viral copies this assay can detect is 138 copies/mL. A negative result does not preclude SARS-Cov-2 infection and should not be used as the sole basis for treatment or other patient management decisions. A negative result may occur with  improper specimen collection/handling, submission of specimen other than nasopharyngeal swab, presence of viral mutation(s) within the areas targeted by this assay, and inadequate number of viral copies(<138 copies/mL). A negative result must be combined with clinical observations, patient history, and epidemiological information. The expected result is Negative.  Fact Sheet for Patients:  BloggerCourse.com  Fact Sheet for Healthcare Providers:  SeriousBroker.it  This test is no t yet approved or cleared by the [place] FDA and  has been authorized for detection and/or diagnosis of SARS-CoV-2 by FDA under an Emergency Use Authorization (EUA). This EUA will remain   in effect (meaning this test can be used) for the duration of the COVID-19 declaration under Section 564(b)(1) of the Act, 21 U.S.C.section 360bbb-3(b)(1), unless the authorization is terminated  or revoked sooner.       Influenza A by PCR NEGATIVE NEGATIVE Final   Influenza B by PCR NEGATIVE NEGATIVE Final    Comment: (NOTE) The Xpert Xpress SARS-CoV-2/FLU/RSV plus assay is intended as an aid in the diagnosis of influenza from Nasopharyngeal swab specimens and should not be used as a sole basis for treatment. Nasal washings and aspirates are unacceptable for Xpert Xpress SARS-CoV-2/FLU/RSV testing.  Fact Sheet for Patients: BloggerCourse.com  Fact Sheet for Healthcare Providers: SeriousBroker.it  This test is not yet approved or cleared by the [place] FDA and has been authorized for detection and/or diagnosis of SARS-CoV-2 by FDA under an Emergency Use Authorization (EUA). This EUA will remain in effect (meaning this test can be used) for the duration of the COVID-19 declaration under Section 564(b)(1) of the Act, 21 U.S.C. section 360bbb-3(b)(1), unless the authorization is terminated or revoked.  Performed at [facility], [address] [place] [address] [place], [place] [address]   Culture, blood (Routine X 2) w Reflex to ID Panel     Status: None (Preliminary result)   Collection Time: 06/03/20  9:38 PM   Specimen: BLOOD  Result Value Ref Range Status   Specimen Description   Final    BLOOD LEFT ANTECUBITAL Performed at [facility], [address] [place], [place] [address]    Special Requests   Final    BOTTLES DRAWN AEROBIC ONLY Blood Culture adequate volume Performed at [facility], [address] [place], [place] [address]    Culture   Final    NO GROWTH 1 DAY Performed at [facility], [address], [place], [place] [address]    Report Status PENDING   Incomplete  Culture, blood (Routine X 2) w Reflex to ID Panel     Status: None (Preliminary result)   Collection Time: 06/03/20  9:38 PM   Specimen: BLOOD  Result Value Ref Range Status   Specimen Description   Final    BLOOD BLOOD LEFT FOREARM Performed at [facility], [address] [place], [place] [address]    Special Requests  Final    BOTTLES DRAWN AEROBIC ONLY Blood Culture results may not be optimal due to an excessive volume of blood received in culture bottles Performed at [facility], [address], [place], [place] [address]    Culture   Final    NO GROWTH 1 DAY Performed at [facility], [address] [place], [place] [address]    Report Status PENDING  Incomplete     Discharge Instructions:   Discharge Instructions    Call MD for:   Complete by: As directed    Urinary retention.   Call MD for:  persistant nausea and vomiting   Complete by: As directed    Call MD for:  severe uncontrolled pain   Complete by: As directed    Call MD for:  temperature >100.4   Complete by: As directed    Diet - low sodium heart healthy   Complete by: As directed    Discharge instructions   Complete by: As directed    Complete the course of antibiotic.  Follow-up with your primary care physician in 1 week.  Check blood work at that time.  Start taking Zestoretic from 06/08/2020.  Please assist medical attention if you have urinary retention.   Increase activity slowly   Complete by: As directed      Allergies as of 06/05/2020      Reactions   Codeine Itching, Rash, Other (See Comments)   General discomfort   Penicillins Itching, Rash      Medication List    STOP taking these medications   ciprofloxacin 500 MG tablet Commonly known as: CIPRO     TAKE these medications   cephALEXin 500 MG capsule Commonly known as: KEFLEX Take 1 capsule (500 mg total) by mouth every 12 (twelve) hours for 5 days.    lisinopril-hydrochlorothiazide 20-12.5 MG tablet Commonly known as: ZESTORETIC Take 1 tablet by mouth daily. Start taking on: June 08, 2020 What changed: These instructions start on June 08, 2020. If you are unsure what to do until then, ask your doctor or other care provider.   metFORMIN 500 MG 24 hr tablet Commonly known as: GLUCOPHAGE-XR Take 500 mg by mouth 2 (two) times daily.   metoprolol tartrate 25 MG tablet Commonly known as: LOPRESSOR Take 25 mg by mouth 2 (two) times daily.   pantoprazole 20 MG tablet Commonly known as: PROTONIX Take 20 mg by mouth daily.   polyethylene glycol 17 g packet Commonly known as: MIRALAX / GLYCOLAX Take 17 g by mouth daily as needed for mild constipation.   tamsulosin 0.4 MG Caps capsule Commonly known as: FLOMAX Take 1 capsule (0.4 mg total) by mouth daily. Start taking on: June 06, 2020   traMADol 50 MG tablet Commonly known as: ULTRAM Take 1 tablet (50 mg total) by mouth every 6 (six) hours as needed for moderate pain.       Follow-up Information    Geoffry Paradise, MD. Schedule an appointment as soon as possible for a visit.   Specialty: Internal Medicine Contact information: [address] [place] [address] [phone]                Time coordinating discharge: 39 minutes  Signed:  Anas Reister  [facility] 06/05/2020, 5:39 PM

## 2020-06-09 LAB — CULTURE, BLOOD (ROUTINE X 2)
Culture: NO GROWTH
Culture: NO GROWTH
Special Requests: ADEQUATE

## 2020-09-08 ENCOUNTER — Emergency Department (HOSPITAL_BASED_OUTPATIENT_CLINIC_OR_DEPARTMENT_OTHER): Payer: Medicare Other

## 2020-09-08 ENCOUNTER — Emergency Department (HOSPITAL_BASED_OUTPATIENT_CLINIC_OR_DEPARTMENT_OTHER)
Admission: EM | Admit: 2020-09-08 | Discharge: 2020-09-08 | Disposition: A | Discharge status: Home / Self Care | Payer: Medicare Other | Attending: Emergency Medicine | Admitting: Emergency Medicine

## 2020-09-08 ENCOUNTER — Encounter (HOSPITAL_BASED_OUTPATIENT_CLINIC_OR_DEPARTMENT_OTHER): Payer: Self-pay

## 2020-09-08 ENCOUNTER — Other Ambulatory Visit: Payer: Self-pay

## 2020-09-08 DIAGNOSIS — I129 Hypertensive chronic kidney disease with stage 1 through stage 4 chronic kidney disease, or unspecified chronic kidney disease: Secondary | ICD-10-CM | POA: Insufficient documentation

## 2020-09-08 DIAGNOSIS — N189 Chronic kidney disease, unspecified: Secondary | ICD-10-CM | POA: Insufficient documentation

## 2020-09-08 DIAGNOSIS — Z7984 Long term (current) use of oral hypoglycemic drugs: Secondary | ICD-10-CM | POA: Insufficient documentation

## 2020-09-08 DIAGNOSIS — W01198A Fall on same level from slipping, tripping and stumbling with subsequent striking against other object, initial encounter: Secondary | ICD-10-CM | POA: Diagnosis not present

## 2020-09-08 DIAGNOSIS — S0990XA Unspecified injury of head, initial encounter: Secondary | ICD-10-CM | POA: Diagnosis present

## 2020-09-08 DIAGNOSIS — S0181XA Laceration without foreign body of other part of head, initial encounter: Secondary | ICD-10-CM | POA: Insufficient documentation

## 2020-09-08 DIAGNOSIS — Z87891 Personal history of nicotine dependence: Secondary | ICD-10-CM | POA: Diagnosis not present

## 2020-09-08 DIAGNOSIS — Y92000 Kitchen of unspecified non-institutional (private) residence as  the place of occurrence of the external cause: Secondary | ICD-10-CM | POA: Insufficient documentation

## 2020-09-08 DIAGNOSIS — E1122 Type 2 diabetes mellitus with diabetic chronic kidney disease: Secondary | ICD-10-CM | POA: Insufficient documentation

## 2020-09-08 DIAGNOSIS — W19XXXA Unspecified fall, initial encounter: Secondary | ICD-10-CM

## 2020-09-08 DIAGNOSIS — Z79899 Other long term (current) drug therapy: Secondary | ICD-10-CM | POA: Insufficient documentation

## 2020-09-08 MED ORDER — LIDOCAINE HCL (PF) 1 % IJ SOLN
5.0000 mL | Freq: Once | INTRAMUSCULAR | Status: AC
  Administered 2020-09-08: 5 mL
  Filled 2020-09-08: qty 5

## 2020-09-08 NOTE — Discharge Instructions (Signed)
Keep your wound dry for 48 hours.  Then you can shower normally.  The steri-strips will fall of on their own.  The stitches should dissolve in 3-7 days on their own.  Do not put ointment on your wound.

## 2020-09-08 NOTE — ED Provider Notes (Signed)
[facility] EMERGENCY DEPT Provider Note   CSN: [id number] Arrival date & time: 09/08/20  2210     History Chief Complaint  Patient presents with  . Fall  . Laceration    Aaron Landry is a 87 y.o. male w/ hx of CKD, TIA, HTN, HLD, presenting to ED with fall and head laceration.  Tripped in kitchen, struck head on floorboard.  No LOC.  Not on A/C.  Here with son.  Tetanus up to date.  HPI     Past Medical History:  Diagnosis Date  . BPH (benign prostatic hyperplasia)   . Chronic kidney disease   . Diabetes mellitus   . Hyperlipidemia   . Hypertension   . OA (ocular albinism) (HCC)   . Pleural effusion   . Renal insufficiency   . TIA (transient ischemic attack)     Patient Active Problem List   Diagnosis Date Noted  . Acute metabolic encephalopathy 06/04/2020  . Complicated UTI (urinary tract infection) 06/03/2020  . Acute renal failure superimposed on stage 3b chronic kidney disease (HCC) 06/03/2020  . Hypomagnesemia 06/03/2020  . Essential hypertension 06/03/2020  . GERD without esophagitis 06/03/2020  . Type 2 diabetes mellitus with stage 3b chronic kidney disease, without long-term current use of insulin (HCC)   . Hyperkalemia 08/24/2014  . Liver abscess 08/24/2014  . Abscess, liver   . Peripheral vascular disease, unspecified (HCC) 01/27/2012  . ABSCESS, LIVER, PYOGENIC 10/20/2008  . Diabetes (HCC) 10/19/2008  . HYPERTENSION NEC 10/19/2008  . Dehydration with hyponatremia 10/08/2008  . Hypokalemia, inadequate intake 10/08/2008    Past Surgical History:  Procedure Laterality Date  . COLON SURGERY  2011  . HERNIA REPAIR    . LIVER BIOPSY         Family History  Problem Relation Age of Onset  . Alzheimer's disease Mother   . Coronary artery disease Father   . Diabetes Father   . Heart attack Father     Social History   Tobacco Use  . Smoking status: Former Smoker    Types: Cigarettes    Quit date: 06/16/1981    Years since  quitting: 39.2  . Smokeless tobacco: Never Used  Substance Use Topics  . Alcohol use: No  . Drug use: No    Home Medications Prior to Admission medications   Medication Sig Start Date End Date Taking? Authorizing Provider  lisinopril-hydrochlorothiazide (ZESTORETIC) 20-12.5 MG tablet Take 1 tablet by mouth daily. 06/08/20   Pokhrel, Rebekah Chesterfield, MD  metFORMIN (GLUCOPHAGE-XR) 500 MG 24 hr tablet Take 500 mg by mouth 2 (two) times daily. 05/30/20   [provider]  metoprolol tartrate (LOPRESSOR) 25 MG tablet Take 25 mg by mouth 2 (two) times daily. 12/12/11   [provider]  pantoprazole (PROTONIX) 20 MG tablet Take 20 mg by mouth daily. 05/30/20   [provider]  polyethylene glycol (MIRALAX / GLYCOLAX) 17 g packet Take 17 g by mouth daily as needed for mild constipation. 06/05/20   Pokhrel, Rebekah Chesterfield, MD  tamsulosin (FLOMAX) 0.4 MG CAPS capsule Take 1 capsule (0.4 mg total) by mouth daily. 06/06/20   Pokhrel, Rebekah Chesterfield, MD  traMADol (ULTRAM) 50 MG tablet Take 1 tablet (50 mg total) by mouth every 6 (six) hours as needed for moderate pain. 08/25/14   Joseph Art, DO    Allergies    Codeine and Penicillins  Review of Systems   Review of Systems  Constitutional: Negative for chills and fever.  Eyes: Negative for  pain and visual disturbance.  Respiratory: Negative for cough and shortness of breath.   Cardiovascular: Negative for chest pain and palpitations.  Gastrointestinal: Negative for abdominal pain and vomiting.  Musculoskeletal: Negative for arthralgias and back pain.  Skin: Positive for rash and wound.  Neurological: Negative for syncope, light-headedness and headaches.  Psychiatric/Behavioral: Negative for agitation and confusion.  All other systems reviewed and are negative.   Physical Exam Updated Vital Signs BP (!) 152/74 (BP Location: Right Arm)   Pulse 74   Temp 98.2 F (36.8 C)   Resp 18   Ht 6' (1.829 m)   Wt 65.8 kg   SpO2 100%   BMI  19.67 kg/m   Physical Exam Constitutional:      General: He is not in acute distress. HENT:     Head: Normocephalic and atraumatic.  Eyes:     Conjunctiva/sclera: Conjunctivae normal.     Pupils: Pupils are equal, round, and reactive to light.  Cardiovascular:     Rate and Rhythm: Normal rate and regular rhythm.     Pulses: Normal pulses.  Pulmonary:     Effort: Pulmonary effort is normal. No respiratory distress.  Abdominal:     General: There is no distension.     Tenderness: There is no abdominal tenderness.  Skin:    General: Skin is warm and dry.     Comments: 2 cm linear laceration above left eyebrow with hematoma  Neurological:     General: No focal deficit present.     Mental Status: He is alert. Mental status is at baseline.  Psychiatric:        Mood and Affect: Mood normal.        Behavior: Behavior normal.     ED Results / Procedures / Treatments   Labs (all labs ordered are listed, but only abnormal results are displayed) Labs Reviewed - No data to display  EKG None  Radiology No results found.  Procedures .Marland KitchenLaceration Repair  Date/Time: 09/08/2020 11:00 PM Performed by: Terald Sleeper, MD Authorized by: Terald Sleeper, MD   Consent:    Consent obtained:  Verbal   Consent given by:  Patient   Risks, benefits, and alternatives were discussed: yes     Risks discussed:  Infection, poor cosmetic result, poor wound healing and pain   Alternatives discussed:  No treatment Universal protocol:    Procedure explained and questions answered to patient or proxy's satisfaction: yes     Site/side marked: yes     Patient identity confirmed:  Arm band and anonymous protocol, patient vented/unresponsive Anesthesia:    Anesthesia method:  Nerve block   Block location:  Supraorbital   Block needle gauge:  27 G   Block anesthetic:  Lidocaine 1% w/o epi   Block injection procedure:  Anatomic landmarks identified, anatomic landmarks palpated, introduced  needle, negative aspiration for blood and incremental injection   Block outcome:  Anesthesia achieved Laceration details:    Location:  Scalp   Scalp location:  Frontal   Length (cm):  2   Depth (mm):  3 Pre-procedure details:    Preparation:  Patient was prepped and draped in usual sterile fashion Exploration:    Limited defect created (wound extended): yes     Hemostasis achieved with:  Direct pressure   Imaging outcome: foreign body not noted     Wound exploration: wound explored through full range of motion and entire depth of wound visualized     Contaminated: no  Treatment:    Area cleansed with:  Saline   Amount of cleaning:  Standard   Irrigation solution:  Sterile saline   Irrigation method:  Pressure wash Skin repair:    Repair method:  Sutures   Suture size:  4-0   Wound skin closure material used: Vicryl.   Suture technique:  Simple interrupted   Number of sutures:  6 Approximation:    Approximation:  Close Repair type:    Repair type:  Simple Post-procedure details:    Dressing:  Sterile dressing   Procedure completion:  Tolerated well, no immediate complications Comments:     Steri strips applied     Medications Ordered in ED Medications  lidocaine (PF) (XYLOCAINE) 1 % injection 5 mL (5 mLs Infiltration Given 09/08/20 2259)    ED Course  I have reviewed the triage vital signs and the nursing notes.  Pertinent labs & imaging results that were available during my care of the patient were reviewed by me and considered in my medical decision making (see chart for details).  Laceration cleaned and repair [facility] for age  Otherwise no evidence of sig trauma, spine fx, hip fx. Pt ambulatory  Here with son  Final Clinical Impression(s) / ED Diagnoses Final diagnoses:  Fall, initial encounter  Laceration of forehead, initial encounter    Rx / DC Orders ED Discharge Orders    None       Aundria Bitterman, Kermit Balo, MD 09/08/20 2304

## 2020-09-08 NOTE — ED Triage Notes (Addendum)
°  Pt here POV  from home   Pt came in following a mechanical fall about an  Hr ago -  Sustained lac to  Forehead -   States he tripped over and fell forward  - denies LOC and no  Blood thinner

## 2020-09-08 NOTE — ED Notes (Signed)
Pt is back from CT

## 2020-09-08 NOTE — ED Notes (Signed)
Pt is transferred to  CT

## 2021-07-18 ENCOUNTER — Ambulatory Visit: Payer: Medicare Other | Admitting: Podiatrist

## 2021-07-18 ENCOUNTER — Encounter: Payer: Self-pay | Admitting: Podiatrist

## 2021-07-18 ENCOUNTER — Other Ambulatory Visit: Payer: Self-pay

## 2021-07-18 DIAGNOSIS — M79609 Pain in unspecified limb: Secondary | ICD-10-CM | POA: Diagnosis not present

## 2021-07-18 DIAGNOSIS — D72829 Elevated white blood cell count, unspecified: Secondary | ICD-10-CM | POA: Insufficient documentation

## 2021-07-18 DIAGNOSIS — D638 Anemia in other chronic diseases classified elsewhere: Secondary | ICD-10-CM | POA: Insufficient documentation

## 2021-07-18 DIAGNOSIS — B351 Tinea unguium: Secondary | ICD-10-CM

## 2021-07-18 DIAGNOSIS — E1142 Type 2 diabetes mellitus with diabetic polyneuropathy: Secondary | ICD-10-CM

## 2021-07-18 DIAGNOSIS — M216X9 Other acquired deformities of unspecified foot: Secondary | ICD-10-CM | POA: Diagnosis not present

## 2021-07-18 DIAGNOSIS — E871 Hypo-osmolality and hyponatremia: Secondary | ICD-10-CM | POA: Insufficient documentation

## 2021-07-18 DIAGNOSIS — E1121 Type 2 diabetes mellitus with diabetic nephropathy: Secondary | ICD-10-CM | POA: Insufficient documentation

## 2021-07-18 NOTE — Progress Notes (Signed)
Chief Complaint  Patient presents with   Debridement    Requesting nail care - diabetic - last a1c was 7.4   New Patient (Initial Visit)     HPI: Patient is 88 y.o. male who presents today for a diabetic foot evaluation and a nail trim.  He relates he does not have much feeling in his feet.  Otherwise he denies any other concerns with his feet.     Patient Active Problem List   Diagnosis Date Noted   Anemia of chronic disease 07/18/2021   Diabetic renal disease ([place]) 07/18/2021   Hyponatremia 07/18/2021   Leukocytosis 07/18/2021   Onychomycosis [id number]   Acute metabolic encephalopathy [id number]   Complicated UTI (urinary tract infection) 06/03/2020   Acute renal failure superimposed on stage 3b chronic kidney disease ([place]) 06/03/2020   Hypomagnesemia 06/03/2020   Essential hypertension 06/03/2020   GERD without esophagitis 06/03/2020   Chronic kidney disease due to hypertension 10/10/2019   Chronic kidney disease, stage 3b ([place]) 10/10/2019   Non-thrombocytopenic purpura ([place]) 11/10/2018   Pain in left knee 10/21/2017   Abnormal gait 01/28/2016   Encounter for general adult medical examination without abnormal findings 12/13/2014   Type 2 diabetes mellitus with stage 3b chronic kidney disease, without long-term current use of insulin ([place])    Hyperkalemia 08/24/2014   Liver abscess 08/24/2014   Abscess, liver    Osteoarthritis 09/28/2013   Diabetic peripheral neuropathy associated with type 2 diabetes mellitus ([place]) 07/12/2012   Degeneration of lumbar intervertebral disc 06/15/2012   Peripheral vascular disease, unspecified ([place]) 01/27/2012   ED (erectile dysfunction) of organic origin 07/16/2011   Elevated prostate specific antigen (PSA) 07/16/2011   Benign prostatic hyperplasia with lower urinary tract symptoms 06/07/2009   Hyperlipidemia 06/07/2009   ABSCESS, LIVER, PYOGENIC 10/20/2008   Diabetes ([place]) 10/19/2008   HYPERTENSION NEC 10/19/2008   Dehydration with  hyponatremia 10/08/2008   Hypokalemia, inadequate intake 10/08/2008    Current Outpatient Medications on File Prior to Visit  Medication Sig Dispense Refill   lisinopril-hydrochlorothiazide (ZESTORETIC) 20-12.5 MG tablet Take 1 tablet by mouth daily.     metFORMIN (GLUCOPHAGE-XR) 500 MG 24 hr tablet Take 500 mg by mouth 2 (two) times daily.     metoprolol tartrate (LOPRESSOR) 25 MG tablet Take 25 mg by mouth 2 (two) times daily.     pantoprazole (PROTONIX) 20 MG tablet Take 20 mg by mouth daily.     polyethylene glycol (MIRALAX / GLYCOLAX) 17 g packet Take 17 g by mouth daily as needed for mild constipation. 14 each 0   tamsulosin (FLOMAX) 0.4 MG CAPS capsule Take 1 capsule (0.4 mg total) by mouth daily. 30 capsule 2   traMADol (ULTRAM) 50 MG tablet Take 1 tablet (50 mg total) by mouth every 6 (six) hours as needed for moderate pain. 15 tablet 0   No current facility-administered medications on file prior to visit.    Allergies  Allergen Reactions   Codeine Itching, Rash and Other (See Comments)    General discomfort   Penicillins Itching and Rash    Review of Systems No fevers, chills, nausea, muscle aches, no difficulty breathing, no calf pain, no chest pain or shortness of breath.   Physical Exam  GENERAL APPEARANCE: Alert, conversant. Appropriately groomed. No acute distress.   VASCULAR: Pedal pulses palpable DP and PT bilateral.  Capillary refill time is immediate to all digits,  Proximal to distal cooling it warm to warm.  Digital perfusion adequate.   NEUROLOGIC: sensation is  absent to 5.07 monofilament at 5/5 sites bilateral.  Light touch is absent bilateral, vibratory sensation diminished bilateral  MUSCULOSKELETAL: acceptable muscle strength, tone and stability bilateral.  High arch foot type is noted right, on the left foot no gross boney pedal deformities noted.  No pain, crepitus or limitation noted with foot and ankle range of motion bilateral.   DERMATOLOGIC: skin  is warm, supple, and dry.  No open lesions noted.  No rash, no pre ulcerative lesions. Digital nails are thick, discolored, dystrophic, brittle with subungual debris present and clinically mycotic x 10.       Assessment     ICD-10-CM   1. Pain due to onychomycosis of nail  B35.1    M79.609     2. Cavus deformity of foot, acquired  M21.6X9     3. Diabetic peripheral neuropathy associated with type 2 diabetes mellitus (HCC)  E11.42         Plan  Discussed exam findings with the patient.  Recommended debridement of the toenails which was carried out to be today via manual and mechanical means without complication.  Patient was recommended on routine nail care every 3 months and will be seen back in 3 months for follow-up.  If any concerns arise prior to that visit he is instructed to call.

## 2021-12-14 ENCOUNTER — Other Ambulatory Visit: Payer: Self-pay

## 2021-12-14 ENCOUNTER — Emergency Department (HOSPITAL_COMMUNITY): Payer: Medicare Other

## 2021-12-14 ENCOUNTER — Encounter (HOSPITAL_COMMUNITY): Payer: Self-pay

## 2021-12-14 ENCOUNTER — Emergency Department (HOSPITAL_COMMUNITY)
Admission: EM | Admit: 2021-12-14 | Discharge: 2021-12-15 | Disposition: A | Discharge status: Home / Self Care | Payer: Medicare Other | Source: Home / Self Care | Attending: Emergency Medicine | Admitting: Emergency Medicine

## 2021-12-14 DIAGNOSIS — N189 Chronic kidney disease, unspecified: Secondary | ICD-10-CM | POA: Insufficient documentation

## 2021-12-14 DIAGNOSIS — R509 Fever, unspecified: Secondary | ICD-10-CM | POA: Insufficient documentation

## 2021-12-14 DIAGNOSIS — I129 Hypertensive chronic kidney disease with stage 1 through stage 4 chronic kidney disease, or unspecified chronic kidney disease: Secondary | ICD-10-CM | POA: Insufficient documentation

## 2021-12-14 DIAGNOSIS — R112 Nausea with vomiting, unspecified: Secondary | ICD-10-CM | POA: Insufficient documentation

## 2021-12-14 DIAGNOSIS — E1122 Type 2 diabetes mellitus with diabetic chronic kidney disease: Secondary | ICD-10-CM | POA: Insufficient documentation

## 2021-12-14 DIAGNOSIS — Z79899 Other long term (current) drug therapy: Secondary | ICD-10-CM | POA: Insufficient documentation

## 2021-12-14 DIAGNOSIS — N419 Inflammatory disease of prostate, unspecified: Secondary | ICD-10-CM | POA: Diagnosis not present

## 2021-12-14 DIAGNOSIS — R7881 Bacteremia: Secondary | ICD-10-CM | POA: Diagnosis not present

## 2021-12-14 LAB — URINALYSIS, ROUTINE W REFLEX MICROSCOPIC
Bacteria, UA: NONE SEEN
Bilirubin Urine: NEGATIVE
Glucose, UA: 50 mg/dL — AB
Ketones, ur: NEGATIVE mg/dL
Leukocytes,Ua: NEGATIVE
Nitrite: NEGATIVE
Protein, ur: NEGATIVE mg/dL
Specific Gravity, Urine: 1.013 (ref 1.005–1.030)
pH: 5 (ref 5.0–8.0)

## 2021-12-14 LAB — CBC WITH DIFFERENTIAL/PLATELET
Abs Immature Granulocytes: 0.07 10*3/uL (ref 0.00–0.07)
Basophils Absolute: 0.1 10*3/uL (ref 0.0–0.1)
Basophils Relative: 0 %
Eosinophils Absolute: 0 10*3/uL (ref 0.0–0.5)
Eosinophils Relative: 0 %
HCT: 36.1 % — ABNORMAL LOW (ref 39.0–52.0)
Hemoglobin: 12.2 g/dL — ABNORMAL LOW (ref 13.0–17.0)
Immature Granulocytes: 1 %
Lymphocytes Relative: 3 %
Lymphs Abs: 0.4 10*3/uL — ABNORMAL LOW (ref 0.7–4.0)
MCH: 31.5 pg (ref 26.0–34.0)
MCHC: 33.8 g/dL (ref 30.0–36.0)
MCV: 93.3 fL (ref 80.0–100.0)
Monocytes Absolute: 0.1 10*3/uL (ref 0.1–1.0)
Monocytes Relative: 1 %
Neutro Abs: 11.6 10*3/uL — ABNORMAL HIGH (ref 1.7–7.7)
Neutrophils Relative %: 95 %
Platelets: 209 10*3/uL (ref 150–400)
RBC: 3.87 MIL/uL — ABNORMAL LOW (ref 4.22–5.81)
RDW: 13.2 % (ref 11.5–15.5)
WBC: 12.3 10*3/uL — ABNORMAL HIGH (ref 4.0–10.5)
nRBC: 0 % (ref 0.0–0.2)

## 2021-12-14 LAB — COMPREHENSIVE METABOLIC PANEL
ALT: 17 U/L (ref 0–44)
AST: 25 U/L (ref 15–41)
Albumin: 3.6 g/dL (ref 3.5–5.0)
Alkaline Phosphatase: 88 U/L (ref 38–126)
Anion gap: 13 (ref 5–15)
BUN: 34 mg/dL — ABNORMAL HIGH (ref 8–23)
CO2: 17 mmol/L — ABNORMAL LOW (ref 22–32)
Calcium: 8.8 mg/dL — ABNORMAL LOW (ref 8.9–10.3)
Chloride: 108 mmol/L (ref 98–111)
Creatinine, Ser: 1.6 mg/dL — ABNORMAL HIGH (ref 0.61–1.24)
GFR, Estimated: 40 mL/min — ABNORMAL LOW (ref >60)
Glucose, Bld: 174 mg/dL — ABNORMAL HIGH (ref 70–99)
Potassium: 3.6 mmol/L (ref 3.5–5.1)
Sodium: 138 mmol/L (ref 135–145)
Total Bilirubin: 1.2 mg/dL (ref 0.3–1.2)
Total Protein: 7 g/dL (ref 6.5–8.1)

## 2021-12-14 LAB — PROTIME-INR
INR: 1.1 (ref 0.8–1.2)
Prothrombin Time: 14.2 seconds (ref 11.4–15.2)

## 2021-12-14 LAB — LACTIC ACID, PLASMA: Lactic Acid, Venous: 2.4 mmol/L (ref 0.5–1.9)

## 2021-12-14 MED ORDER — SODIUM CHLORIDE 0.9 % IV BOLUS
1000.0000 mL | Freq: Once | INTRAVENOUS | Status: AC
  Administered 2021-12-14: 1000 mL via INTRAVENOUS

## 2021-12-14 NOTE — ED Notes (Signed)
Pt in x-ray at this time

## 2021-12-14 NOTE — ED Provider Notes (Signed)
[facility]-EMERGENCY DEPT Provider Note   CSN: [id number] Arrival date & time: 12/14/21  2144     History {Add pertinent medical, surgical, social history, OB history to HPI:1} Chief Complaint  Patient presents with   UTI   Fever    Aaron Landry is a 88 y.o. male.  The history is provided by the patient and medical records.  Fever Associated symptoms: nausea    88 y.o. M with hx of DM, HLP, hx TIA, HTN, BPH, CKD, frequent UTI's, presenting to the ED with son and grandson for possible UTI.  States tonight around [place] they noticed a change-- he developed fever, nausea with very small amount of vomiting, and started to have difficulty urinating.  Son reports hx of UTI's over the past 18 months, often times presenting this way.  He has never required foley catheter placement.  He did eat/drink normally today.  No apparent confusion or change in mental status.  Home Medications Prior to Admission medications   Medication Sig Start Date End Date Taking? Authorizing Provider  Aspirin-Acetaminophen (GOODYS BODY PAIN PO) Take 1 tablet by mouth daily.   Yes [provider]  lisinopril-hydrochlorothiazide (ZESTORETIC) 20-12.5 MG tablet Take 1 tablet by mouth daily. Patient not taking: Reported on 12/14/2021 06/08/20   Pokhrel, Rebekah Chesterfield, MD  polyethylene glycol (MIRALAX / GLYCOLAX) 17 g packet Take 17 g by mouth daily as needed for mild constipation. Patient not taking: Reported on 12/14/2021 06/05/20   Joycelyn Das, MD  tamsulosin (FLOMAX) 0.4 MG CAPS capsule Take 1 capsule (0.4 mg total) by mouth daily. Patient not taking: Reported on 12/14/2021 06/06/20   Pokhrel, Rebekah Chesterfield, MD  traMADol (ULTRAM) 50 MG tablet Take 1 tablet (50 mg total) by mouth every 6 (six) hours as needed for moderate pain. Patient not taking: Reported on 12/14/2021 08/25/14   Joseph Art, DO      Allergies    Codeine and Penicillins    Review of Systems   Review of Systems   Constitutional:  Positive for fever.  Gastrointestinal:  Positive for nausea.  Genitourinary:  Positive for difficulty urinating.  All other systems reviewed and are negative.   Physical Exam Updated Vital Signs BP 130/63   Pulse 92   Temp 99.3 F (37.4 C) (Oral)   Resp 13   Ht 6' (1.829 m)   Wt 63.5 kg   SpO2 98%   BMI 18.99 kg/m   Physical Exam Vitals and nursing note reviewed.  Constitutional:      Appearance: He is well-developed.     Comments: Elderly, very pleasant  HENT:     Head: Normocephalic and atraumatic.  Eyes:     Conjunctiva/sclera: Conjunctivae normal.     Pupils: Pupils are equal, round, and reactive to light.  Cardiovascular:     Rate and Rhythm: Normal rate and regular rhythm.     Heart sounds: Normal heart sounds.  Pulmonary:     Effort: Pulmonary effort is normal.     Breath sounds: Normal breath sounds.  Abdominal:     General: Bowel sounds are normal.     Palpations: Abdomen is soft.     Comments: Soft, non-tender  Musculoskeletal:        General: Normal range of motion.     Cervical back: Normal range of motion.  Skin:    General: Skin is warm and dry.  Neurological:     Mental Status: He is alert and oriented to person, place, and time.  Comments: AAOx3, answering questions and following commands, moving extremities well     ED Results / Procedures / Treatments   Labs (all labs ordered are listed, but only abnormal results are displayed) Labs Reviewed  COMPREHENSIVE METABOLIC PANEL - Abnormal; Notable for the following components:      Result Value   CO2 17 (*)    Glucose, Bld 174 (*)    BUN 34 (*)    Creatinine, Ser 1.60 (*)    Calcium 8.8 (*)    GFR, Estimated 40 (*)    All other components within normal limits  LACTIC ACID, PLASMA - Abnormal; Notable for the following components:   Lactic Acid, Venous 2.4 (*)    All other components within normal limits  CBC WITH DIFFERENTIAL/PLATELET - Abnormal; Notable for the  following components:   WBC 12.3 (*)    RBC 3.87 (*)    Hemoglobin 12.2 (*)    HCT 36.1 (*)    Neutro Abs 11.6 (*)    Lymphs Abs 0.4 (*)    All other components within normal limits  CULTURE, BLOOD (ROUTINE X 2)  CULTURE, BLOOD (ROUTINE X 2)  URINE CULTURE  PROTIME-INR  LACTIC ACID, PLASMA  URINALYSIS, ROUTINE W REFLEX MICROSCOPIC    EKG None  Radiology DG Chest 2 View  Result Date: 12/14/2021 CLINICAL DATA:  Fever, nausea and vomiting. EXAM: CHEST - 2 VIEW COMPARISON:  Chest plain film, dated June 03, 2020, and abdomen and pelvis CT, dated September 29, 2014 FINDINGS: The heart size and mediastinal contours are within normal limits. There is marked severity calcification of the thoracic aorta. Mild, stable linear scarring and/or atelectasis is seen within the right lower lobe. An ill-defined focal opacity is seen overlying the medial aspect of the right lung base. This is seen on the prior study and is unchanged in appearance. The visualized skeletal structures are unremarkable. IMPRESSION: 1. Mild, stable right lower lobe linear scarring and/or atelectasis. 2. Findings which correspond to an area of rounded atelectasis seen within the posterior right lung base on the prior abdomen and pelvis CT. Electronically Signed   By: Aram Candela M.D.   On: 12/14/2021 22:38    Procedures Procedures  {Document cardiac monitor, telemetry assessment procedure when appropriate:1}  Medications Ordered in ED Medications  sodium chloride 0.9 % bolus 1,000 mL (has no administration in time range)    ED Course/ Medical Decision Making/ A&P                           Medical Decision Making Amount and/or Complexity of Data Reviewed Labs: ordered. Radiology: ordered.   ***  Bladder scan with 300cc so not significant retention.  Able to urinate here and provide UA without issue.  UA does reveal small blood but no bacteria, no leukocytes, negative nitrates.  Patient reassessed, continues to  feel okay.  He has not had any further emesis.  Heart rate is now within normal limits.  Discussed with son results thus far, no explainable cause of fever.  He does report history of liver abscesses in the past.  We will proceed with CT scan to exclude any other cause of fever. Final Clinical Impression(s) / ED Diagnoses Final diagnoses:  None    Rx / DC Orders ED Discharge Orders     None

## 2021-12-14 NOTE — ED Triage Notes (Addendum)
Pt reports with a fever, nausea, vomiting, and states that he can't urinate since today. Pt's grandson reports a hx of UTI's. Pt was given Tylenol prior to arrival.

## 2021-12-15 ENCOUNTER — Emergency Department (HOSPITAL_COMMUNITY): Payer: Medicare Other

## 2021-12-15 LAB — LACTIC ACID, PLASMA: Lactic Acid, Venous: 1.8 mmol/L (ref 0.5–1.9)

## 2021-12-15 MED ORDER — ONDANSETRON 4 MG PO TBDP: 4.0000 mg | ORAL_TABLET | Freq: Three times a day (TID) | ORAL | 0 refills | Status: DC | PRN

## 2021-12-15 MED ORDER — IOHEXOL 300 MG/ML  SOLN
100.0000 mL | Freq: Once | INTRAMUSCULAR | Status: AC | PRN
  Administered 2021-12-15: 100 mL via INTRAVENOUS

## 2021-12-15 NOTE — ED Notes (Signed)
Patient verbalizes understanding of discharge instructions. Opportunity for questioning and answers were provided. Armband removed by staff, pt discharged from ED to home via POV with son ?

## 2021-12-15 NOTE — Discharge Instructions (Signed)
Take the prescribed medication as directed.  Make sure to rest and stay hydrated. You will be contacted if urine or blood cultures are abnormal. Follow-up with your primary care doctor. Return to the ED for new or worsening symptoms.

## 2021-12-16 ENCOUNTER — Telehealth (HOSPITAL_BASED_OUTPATIENT_CLINIC_OR_DEPARTMENT_OTHER): Payer: Self-pay

## 2021-12-16 ENCOUNTER — Inpatient Hospital Stay (HOSPITAL_COMMUNITY)
Admission: EM | Admit: 2021-12-16 | Discharge: 2021-12-22 | DRG: 728 | Disposition: A | Discharge status: Home Health | Payer: Medicare Other | Attending: Internal Medicine | Admitting: Internal Medicine

## 2021-12-16 ENCOUNTER — Emergency Department (HOSPITAL_COMMUNITY): Payer: Medicare Other

## 2021-12-16 ENCOUNTER — Other Ambulatory Visit: Payer: Self-pay

## 2021-12-16 DIAGNOSIS — Z87891 Personal history of nicotine dependence: Secondary | ICD-10-CM

## 2021-12-16 DIAGNOSIS — Z8249 Family history of ischemic heart disease and other diseases of the circulatory system: Secondary | ICD-10-CM

## 2021-12-16 DIAGNOSIS — Z833 Family history of diabetes mellitus: Secondary | ICD-10-CM | POA: Diagnosis not present

## 2021-12-16 DIAGNOSIS — Z820 Family history of epilepsy and other diseases of the nervous system: Secondary | ICD-10-CM | POA: Diagnosis not present

## 2021-12-16 DIAGNOSIS — Z79899 Other long term (current) drug therapy: Secondary | ICD-10-CM | POA: Diagnosis not present

## 2021-12-16 DIAGNOSIS — N400 Enlarged prostate without lower urinary tract symptoms: Secondary | ICD-10-CM | POA: Diagnosis present

## 2021-12-16 DIAGNOSIS — Z862 Personal history of diseases of the blood and blood-forming organs and certain disorders involving the immune mechanism: Secondary | ICD-10-CM

## 2021-12-16 DIAGNOSIS — R531 Weakness: Secondary | ICD-10-CM

## 2021-12-16 DIAGNOSIS — Z885 Allergy status to narcotic agent status: Secondary | ICD-10-CM | POA: Diagnosis not present

## 2021-12-16 DIAGNOSIS — R338 Other retention of urine: Secondary | ICD-10-CM | POA: Diagnosis present

## 2021-12-16 DIAGNOSIS — N179 Acute kidney failure, unspecified: Secondary | ICD-10-CM | POA: Diagnosis present

## 2021-12-16 DIAGNOSIS — R7881 Bacteremia: Secondary | ICD-10-CM | POA: Diagnosis present

## 2021-12-16 DIAGNOSIS — Z87440 Personal history of urinary (tract) infections: Secondary | ICD-10-CM | POA: Diagnosis not present

## 2021-12-16 DIAGNOSIS — N1832 Chronic kidney disease, stage 3b: Secondary | ICD-10-CM | POA: Diagnosis present

## 2021-12-16 DIAGNOSIS — E1165 Type 2 diabetes mellitus with hyperglycemia: Secondary | ICD-10-CM | POA: Diagnosis present

## 2021-12-16 DIAGNOSIS — E785 Hyperlipidemia, unspecified: Secondary | ICD-10-CM | POA: Diagnosis present

## 2021-12-16 DIAGNOSIS — E1122 Type 2 diabetes mellitus with diabetic chronic kidney disease: Secondary | ICD-10-CM | POA: Diagnosis present

## 2021-12-16 DIAGNOSIS — N419 Inflammatory disease of prostate, unspecified: Secondary | ICD-10-CM | POA: Diagnosis present

## 2021-12-16 DIAGNOSIS — B9620 Unspecified Escherichia coli [E. coli] as the cause of diseases classified elsewhere: Secondary | ICD-10-CM | POA: Diagnosis present

## 2021-12-16 DIAGNOSIS — Z8673 Personal history of transient ischemic attack (TIA), and cerebral infarction without residual deficits: Secondary | ICD-10-CM | POA: Diagnosis not present

## 2021-12-16 DIAGNOSIS — Z880 Allergy status to penicillin: Secondary | ICD-10-CM

## 2021-12-16 DIAGNOSIS — E872 Acidosis, unspecified: Secondary | ICD-10-CM | POA: Diagnosis present

## 2021-12-16 DIAGNOSIS — J9811 Atelectasis: Secondary | ICD-10-CM | POA: Diagnosis present

## 2021-12-16 DIAGNOSIS — D631 Anemia in chronic kidney disease: Secondary | ICD-10-CM | POA: Diagnosis present

## 2021-12-16 DIAGNOSIS — I129 Hypertensive chronic kidney disease with stage 1 through stage 4 chronic kidney disease, or unspecified chronic kidney disease: Secondary | ICD-10-CM | POA: Diagnosis present

## 2021-12-16 DIAGNOSIS — N189 Chronic kidney disease, unspecified: Secondary | ICD-10-CM | POA: Diagnosis not present

## 2021-12-16 DIAGNOSIS — Z7982 Long term (current) use of aspirin: Secondary | ICD-10-CM | POA: Diagnosis not present

## 2021-12-16 LAB — CBC WITH DIFFERENTIAL/PLATELET
Abs Immature Granulocytes: 0.05 10*3/uL (ref 0.00–0.07)
Basophils Absolute: 0.1 10*3/uL (ref 0.0–0.1)
Basophils Relative: 1 %
Eosinophils Absolute: 0.4 10*3/uL (ref 0.0–0.5)
Eosinophils Relative: 5 %
HCT: 34.5 % — ABNORMAL LOW (ref 39.0–52.0)
Hemoglobin: 11.3 g/dL — ABNORMAL LOW (ref 13.0–17.0)
Immature Granulocytes: 1 %
Lymphocytes Relative: 18 %
Lymphs Abs: 1.5 10*3/uL (ref 0.7–4.0)
MCH: 30.8 pg (ref 26.0–34.0)
MCHC: 32.8 g/dL (ref 30.0–36.0)
MCV: 94 fL (ref 80.0–100.0)
Monocytes Absolute: 0.9 10*3/uL (ref 0.1–1.0)
Monocytes Relative: 11 %
Neutro Abs: 5.4 10*3/uL (ref 1.7–7.7)
Neutrophils Relative %: 64 %
Platelets: 201 10*3/uL (ref 150–400)
RBC: 3.67 MIL/uL — ABNORMAL LOW (ref 4.22–5.81)
RDW: 13.5 % (ref 11.5–15.5)
WBC: 8.3 10*3/uL (ref 4.0–10.5)
nRBC: 0 % (ref 0.0–0.2)

## 2021-12-16 LAB — COMPREHENSIVE METABOLIC PANEL
ALT: 21 U/L (ref 0–44)
AST: 31 U/L (ref 15–41)
Albumin: 3.6 g/dL (ref 3.5–5.0)
Alkaline Phosphatase: 77 U/L (ref 38–126)
Anion gap: 8 (ref 5–15)
BUN: 45 mg/dL — ABNORMAL HIGH (ref 8–23)
CO2: 22 mmol/L (ref 22–32)
Calcium: 8.3 mg/dL — ABNORMAL LOW (ref 8.9–10.3)
Chloride: 107 mmol/L (ref 98–111)
Creatinine, Ser: 1.84 mg/dL — ABNORMAL HIGH (ref 0.61–1.24)
GFR, Estimated: 34 mL/min — ABNORMAL LOW (ref >60)
Glucose, Bld: 141 mg/dL — ABNORMAL HIGH (ref 70–99)
Potassium: 4 mmol/L (ref 3.5–5.1)
Sodium: 137 mmol/L (ref 135–145)
Total Bilirubin: 0.5 mg/dL (ref 0.3–1.2)
Total Protein: 6.9 g/dL (ref 6.5–8.1)

## 2021-12-16 LAB — BLOOD CULTURE ID PANEL (REFLEXED) - BCID2

## 2021-12-16 LAB — URINALYSIS, ROUTINE W REFLEX MICROSCOPIC
Bilirubin Urine: NEGATIVE
Glucose, UA: 150 mg/dL — AB
Ketones, ur: NEGATIVE mg/dL
Leukocytes,Ua: NEGATIVE
Nitrite: NEGATIVE
Protein, ur: NEGATIVE mg/dL
Specific Gravity, Urine: 1.027 (ref 1.005–1.030)
pH: 5 (ref 5.0–8.0)

## 2021-12-16 LAB — URINE CULTURE: Culture: NO GROWTH

## 2021-12-16 LAB — LACTIC ACID, PLASMA
Lactic Acid, Venous: 2.8 mmol/L (ref 0.5–1.9)
Lactic Acid, Venous: 2.8 mmol/L (ref 0.5–1.9)

## 2021-12-16 MED ORDER — POLYETHYLENE GLYCOL 3350 17 G PO PACK: 17.0000 g | PACK | Freq: Every day | ORAL | Status: DC | PRN

## 2021-12-16 MED ORDER — SODIUM CHLORIDE 0.9 % IV SOLN
2.0000 g | Freq: Once | INTRAVENOUS | Status: AC
  Administered 2021-12-16: 2 g via INTRAVENOUS
  Filled 2021-12-16: qty 20

## 2021-12-16 MED ORDER — PROCHLORPERAZINE EDISYLATE 10 MG/2ML IJ SOLN: 10.0000 mg | Freq: Four times a day (QID) | INTRAMUSCULAR | Status: DC | PRN

## 2021-12-16 MED ORDER — ENOXAPARIN SODIUM 30 MG/0.3ML IJ SOSY
30.0000 mg | PREFILLED_SYRINGE | INTRAMUSCULAR | Status: DC
  Administered 2021-12-17: 30 mg via SUBCUTANEOUS
  Filled 2021-12-16: qty 0.3

## 2021-12-16 MED ORDER — SODIUM CHLORIDE 0.9 % IV SOLN
1.0000 g | Freq: Once | INTRAVENOUS | Status: DC
  Filled 2021-12-16: qty 10

## 2021-12-16 MED ORDER — SODIUM CHLORIDE 0.9 % IV SOLN
2.0000 g | INTRAVENOUS | Status: DC
  Administered 2021-12-17 – 2021-12-19 (×3): 2 g via INTRAVENOUS
  Filled 2021-12-16 (×3): qty 20

## 2021-12-16 MED ORDER — TAMSULOSIN HCL 0.4 MG PO CAPS
0.4000 mg | ORAL_CAPSULE | Freq: Every day | ORAL | Status: DC
  Administered 2021-12-17 – 2021-12-22 (×6): 0.4 mg via ORAL
  Filled 2021-12-16 (×6): qty 1

## 2021-12-16 MED ORDER — MELATONIN 5 MG PO TABS: 5.0000 mg | ORAL_TABLET | Freq: Every evening | ORAL | Status: DC | PRN

## 2021-12-16 MED ORDER — LACTATED RINGERS IV SOLN: INTRAVENOUS | Status: DC

## 2021-12-16 MED ORDER — ACETAMINOPHEN 325 MG PO TABS: 650.0000 mg | ORAL_TABLET | Freq: Four times a day (QID) | ORAL | Status: DC | PRN

## 2021-12-16 NOTE — Telephone Encounter (Signed)
Called and spoke with Aaron Landry, patients son about positive blood cultures as informed by [facility] microlab. After speaking with Dr. Jacqulyn Bath at [facility] ED, this RN advised patient to return to [facility] for re-evaluation and possible admission. Son, Aaron Landry, understands and agrees to return to ED.

## 2021-12-16 NOTE — H&P (Signed)
History and Physical  Aaron Landry Aaron Landry:[id number] DOB: [birth year] DOA: 12/16/2021  Referring physician: Dr. Silverio Lay, EDP  PCP: Aaron Paradise, MD  Outpatient Specialists: Urology, podiatry Patient coming from: Home  Chief Complaint: Bacteremia   HPI: Aaron Landry is a 89 y.o. male with medical history significant for recurrent UTIs, recent visit to the ED 12/14/21, BPH who presented to [facility] ED from home due to E. coli bacteremia from blood culture drawn on 12/14/21.  The patient was called from home by the ED and advised to return to the [facility] ED for reevaluation and possible admission.  While in the ED, afebrile with no leukocytosis.  EDP discussed the case with infectious disease.  Blood cultures were repeated, peripherally x2.  The patient received 2 g IV Rocephin empirically.  TRH, hospitalist service, was asked to admit.  ED Course: Tmax 98.  BP 167/81, pulse 65, respiration rate 16, O2 saturation 100% on room air  Review of Systems: Review of systems as noted in the HPI. All other systems reviewed and are negative.   Past Medical History:  Diagnosis Date   BPH (benign prostatic hyperplasia)    Chronic kidney disease    Diabetes mellitus    Hyperlipidemia    Hypertension    OA (ocular albinism) (HCC)    Pleural effusion    Renal insufficiency    TIA (transient ischemic attack)    Past Surgical History:  Procedure Laterality Date   COLON SURGERY  2011   HERNIA REPAIR     LIVER BIOPSY      Social History:  reports that he quit smoking about 40 years ago. His smoking use included cigarettes. He has never used smokeless tobacco. He reports that he does not drink alcohol and does not use drugs.   Allergies  Allergen Reactions   Codeine Itching, Rash and Other (See Comments)    General discomfort   Penicillins Itching and Rash    Family History  Problem Relation Age of Onset   Alzheimer's disease Mother    Coronary artery disease Father    Diabetes Father    Heart  attack Father       Prior to Admission medications   Medication Sig Start Date End Date Taking? Authorizing Provider  Aspirin-Acetaminophen (GOODYS BODY PAIN PO) Take 1 tablet by mouth daily.    [provider]  lisinopril-hydrochlorothiazide (ZESTORETIC) 20-12.5 MG tablet Take 1 tablet by mouth daily. Patient not taking: Reported on 12/14/2021 06/08/20   Pokhrel, Rebekah Chesterfield, MD  ondansetron (ZOFRAN-ODT) 4 MG disintegrating tablet Take 1 tablet (4 mg total) by mouth every 8 (eight) hours as needed for nausea. 12/15/21   Garlon Hatchet, PA-C  polyethylene glycol (MIRALAX / GLYCOLAX) 17 g packet Take 17 g by mouth daily as needed for mild constipation. Patient not taking: Reported on 12/14/2021 06/05/20   Joycelyn Das, MD  tamsulosin (FLOMAX) 0.4 MG CAPS capsule Take 1 capsule (0.4 mg total) by mouth daily. Patient not taking: Reported on 12/14/2021 06/06/20   Pokhrel, Rebekah Chesterfield, MD  traMADol (ULTRAM) 50 MG tablet Take 1 tablet (50 mg total) by mouth every 6 (six) hours as needed for moderate pain. Patient not taking: Reported on 12/14/2021 08/25/14   Joseph Art, DO    Physical Exam: BP (!) 166/81   Pulse 64   Temp 98 F (36.7 C)   Resp 16   Ht 6' (1.829 m)   Wt 63.5 kg   SpO2 100%   BMI 18.99 kg/m  General: 88 y.o. year-old male well developed well nourished in no acute distress.  Alert and oriented x3. Cardiovascular: Regular rate and rhythm with no rubs or gallops.  No thyromegaly or JVD noted.  No lower extremity edema. 2/4 pulses in all 4 extremities. Respiratory: Clear to auscultation with no wheezes or rales. Good inspiratory effort. Abdomen: Soft nontender nondistended with normal bowel sounds x4 quadrants. Muskuloskeletal: No cyanosis, clubbing or edema noted bilaterally Neuro: CN II-XII intact, strength, sensation, reflexes Skin: No ulcerative lesions noted or rashes Psychiatry: Judgement and insight appear normal. Mood is appropriate for condition and setting           Labs on Admission:  Basic Metabolic Panel: Recent Labs  Lab 12/14/21 2208 12/16/21 2102  NA 138 137  K 3.6 4.0  CL 108 107  CO2 17* 22  GLUCOSE 174* 141*  BUN 34* 45*  CREATININE 1.60* 1.84*  CALCIUM 8.8* 8.3*   Liver Function Tests: Recent Labs  Lab 12/14/21 2208 12/16/21 2102  AST 25 31  ALT 17 21  ALKPHOS 88 77  BILITOT 1.2 0.5  PROT 7.0 6.9  ALBUMIN 3.6 3.6   No results for input(s): "LIPASE", "AMYLASE" in the last 168 hours. No results for input(s): "AMMONIA" in the last 168 hours. CBC: Recent Labs  Lab 12/14/21 2208 12/16/21 2102  WBC 12.3* 8.3  NEUTROABS 11.6* 5.4  HGB 12.2* 11.3*  HCT 36.1* 34.5*  MCV 93.3 94.0  PLT 209 201   Cardiac Enzymes: No results for input(s): "CKTOTAL", "CKMB", "CKMBINDEX", "TROPONINI" in the last 168 hours.  BNP (last 3 results) No results for input(s): "BNP" in the last 8760 hours.  ProBNP (last 3 results) No results for input(s): "PROBNP" in the last 8760 hours.  CBG: No results for input(s): "GLUCAP" in the last 168 hours.  Radiological Exams on Admission: DG Chest Port 1 View  Result Date: 12/16/2021 CLINICAL DATA:  fever, positive blood culture EXAM: PORTABLE CHEST 1 VIEW COMPARISON:  Chest x-ray 12/14/2021 FINDINGS: The heart and mediastinal contours are unchanged. Aortic calcification. Cortical scarring of the right lower lung zone. No focal consolidation. No pulmonary edema. No pleural effusion. No pneumothorax. No acute osseous abnormality. Heterotopic calcification along the left proximal humerus. IMPRESSION: No active disease. Electronically Signed   By: Tish Frederickson M.D.   On: 12/16/2021 20:52   CT ABDOMEN PELVIS W CONTRAST  Result Date: 12/15/2021 CLINICAL DATA:  Nausea, vomiting, abdominal pain and fever. EXAM: CT ABDOMEN AND PELVIS WITH CONTRAST TECHNIQUE: Multidetector CT imaging of the abdomen and pelvis was performed using the standard protocol following bolus administration of intravenous contrast.  RADIATION DOSE REDUCTION: This exam was performed according to the departmental dose-optimization program which includes automated exposure control, adjustment of the mA and/or kV according to patient size and/or use of iterative reconstruction technique. CONTRAST:  OMNIPAQUE IOHEXOL 300 MG/ML  SOLN COMPARISON:  September 29, 2014 FINDINGS: Lower chest: A stable area of partially calcified low attenuation is seen within the posteromedial aspect of the right lung base. A small, adjacent chronic right pleural effusion is seen. Hepatobiliary: No focal liver abnormality is seen. No gallstones, gallbladder wall thickening, or biliary dilatation. Pancreas: Unremarkable. No pancreatic ductal dilatation or surrounding inflammatory changes. Spleen: Normal in size without focal abnormality. Adrenals/Urinary Tract: Adrenal glands are unremarkable. Kidneys are normal in size, without renal calculi or hydronephrosis. Multiple bilateral renal cysts are seen. Bladder is unremarkable. Stomach/Bowel: Stomach is within normal limits. Appendix appears normal. A large amount of stool is  seen throughout the colon. No evidence of bowel wall thickening, distention, or inflammatory changes. Noninflamed diverticula are seen throughout the large bowel. Vascular/Lymphatic: Aortic atherosclerosis. No enlarged abdominal or pelvic lymph nodes. Reproductive: The prostate gland is markedly enlarged and heterogeneous in appearance. Other: There is a 2.8 cm x 1.8 cm fat containing right inguinal hernia. Multiple surrounding surgical coils are seen within this region. No abdominopelvic ascites. Musculoskeletal: Multilevel degenerative changes are seen throughout the lumbar spine. IMPRESSION: 1. Colonic diverticulosis. 2. Markedly enlarged and heterogeneous prostate gland. Correlation with PSA values is recommended. 3. Fat-containing right inguinal hernia. 4. Stable area of rounded atelectasis and associated small pleural effusion within the right  lung base. 5. Aortic atherosclerosis. Aortic Atherosclerosis (ICD10-I70.0). Electronically Signed   By: Aram Candela M.D.   On: 12/15/2021 00:50   DG Chest 2 View  Result Date: 12/14/2021 CLINICAL DATA:  Fever, nausea and vomiting. EXAM: CHEST - 2 VIEW COMPARISON:  Chest plain film, dated June 03, 2020, and abdomen and pelvis CT, dated September 29, 2014 FINDINGS: The heart size and mediastinal contours are within normal limits. There is marked severity calcification of the thoracic aorta. Mild, stable linear scarring and/or atelectasis is seen within the right lower lobe. An ill-defined focal opacity is seen overlying the medial aspect of the right lung base. This is seen on the prior study and is unchanged in appearance. The visualized skeletal structures are unremarkable. IMPRESSION: 1. Mild, stable right lower lobe linear scarring and/or atelectasis. 2. Findings which correspond to an area of rounded atelectasis seen within the posterior right lung base on the prior abdomen and pelvis CT. Electronically Signed   By: Aram Candela M.D.   On: 12/14/2021 22:38    EKG: I independently viewed the EKG done and my findings are as followed:    Assessment/Plan Present on Admission:  Bacteremia due to Escherichia coli  Principal Problem:   Bacteremia due to Escherichia coli  E. coli bacteremia, POA Blood cultures drawn on 12/14/2021 positive for E. Coli Follow sensitivity to narrow down antibiotics. Repeat blood cultures x2 peripherally on 12/16/2021. EDP discussed the case with ID on-call. Continue Rocephin 2 g daily started in the ED. Gentle IV fluid hydration.  CKD 3B Appears to be close to his baseline creatinine 1.8 with GFR greater than 34 Avoid nephrotoxic agents, dehydration and hypotension. Monitor urine output Repeat renal panel in the morning  Lactic acidosis Lactic acid level 2.8 Trend lactic acid Continue IV fluid hydration Treat bacteremia  Anemia of chronic disease  with CKD No overt bleeding Continue to monitor  Hyperglycemia, diet controlled. Obtain hemoglobin A1c   DVT prophylaxis: SQ Lovenox daily   Code Status: Full code   Family Communication: None at baseline   Disposition Plan: Admitted to telemetry   Consults called: None   Admission status: Inpatient status    Status is: Inpatient The patient requires at least 2 midnights for further evaluation and treatment of present condition.   Darlin Drop MD [facility] Pager [phone]  If 7PM-7AM, please contact night-coverage [email or web address] Password [facility]  12/16/2021, 10:22 PM

## 2021-12-16 NOTE — ED Provider Notes (Addendum)
[facility]-EMERGENCY DEPT Provider Note   CSN: [id number] Arrival date & time: 12/16/21  1756     History  Chief Complaint  Patient presents with   positive blood culture    Aaron Landry is a 88 y.o. male hx of HTN, here presenting with positive blood culture.  Patient was seen here 2 days ago and had a normal urinalysis and had a blood culture that showed E. coli.  Patient was running a fever at that time.  He has subjective fever since then.  Patient also had a CT abdomen pelvis at that point that was unremarkable.  Patient was called for positive blood culture was sent here for further evaluation.  Of note, patient was admitted last year for E. coli bacteremia was secondary to UTI.  Patient was given Rocephin and transition to Keflex at that time  The history is provided by the patient.       Home Medications Prior to Admission medications   Medication Sig Start Date End Date Taking? Authorizing Provider  Aspirin-Acetaminophen (GOODYS BODY PAIN PO) Take 1 tablet by mouth daily.    [provider]  lisinopril-hydrochlorothiazide (ZESTORETIC) 20-12.5 MG tablet Take 1 tablet by mouth daily. Patient not taking: Reported on 12/14/2021 06/08/20   Pokhrel, Rebekah Chesterfield, MD  ondansetron (ZOFRAN-ODT) 4 MG disintegrating tablet Take 1 tablet (4 mg total) by mouth every 8 (eight) hours as needed for nausea. 12/15/21   Garlon Hatchet, PA-C  polyethylene glycol (MIRALAX / GLYCOLAX) 17 g packet Take 17 g by mouth daily as needed for mild constipation. Patient not taking: Reported on 12/14/2021 06/05/20   Joycelyn Das, MD  tamsulosin (FLOMAX) 0.4 MG CAPS capsule Take 1 capsule (0.4 mg total) by mouth daily. Patient not taking: Reported on 12/14/2021 06/06/20   Pokhrel, Rebekah Chesterfield, MD  traMADol (ULTRAM) 50 MG tablet Take 1 tablet (50 mg total) by mouth every 6 (six) hours as needed for moderate pain. Patient not taking: Reported on 12/14/2021 08/25/14   Joseph Art, DO       Allergies    Codeine and Penicillins    Review of Systems   Review of Systems  Constitutional:  Positive for chills, fatigue and fever.  All other systems reviewed and are negative.   Physical Exam Updated Vital Signs BP (!) 180/89   Pulse 67   Temp 98 F (36.7 C)   Resp 16   Ht 6' (1.829 m)   Wt 63.5 kg   SpO2 100%   BMI 18.99 kg/m  Physical Exam Vitals and nursing note reviewed.  Constitutional:      Appearance: Normal appearance.  HENT:     Head: Normocephalic.     Nose: Nose normal.     Mouth/Throat:     Mouth: Mucous membranes are moist.  Eyes:     Extraocular Movements: Extraocular movements intact.     Pupils: Pupils are equal, round, and reactive to light.  Cardiovascular:     Rate and Rhythm: Normal rate and regular rhythm.     Pulses: Normal pulses.     Heart sounds: Normal heart sounds.  Pulmonary:     Effort: Pulmonary effort is normal.     Breath sounds: Normal breath sounds.  Abdominal:     General: Abdomen is flat.     Palpations: Abdomen is soft.  Musculoskeletal:        General: Normal range of motion.     Cervical back: Normal range of motion and neck supple.  Skin:    General: Skin is warm.     Capillary Refill: Capillary refill takes less than 2 seconds.  Neurological:     General: No focal deficit present.     Mental Status: He is alert and oriented to person, place, and time.  Psychiatric:        Mood and Affect: Mood normal.        Behavior: Behavior normal.     ED Results / Procedures / Treatments   Labs (all labs ordered are listed, but only abnormal results are displayed) Labs Reviewed  CULTURE, BLOOD (ROUTINE X 2)  CULTURE, BLOOD (ROUTINE X 2)  URINE CULTURE  CBC WITH DIFFERENTIAL/PLATELET  COMPREHENSIVE METABOLIC PANEL  LACTIC ACID, PLASMA  LACTIC ACID, PLASMA  URINALYSIS, ROUTINE W REFLEX MICROSCOPIC    EKG None  Radiology DG Chest Port 1 View  Result Date: 12/16/2021 CLINICAL DATA:  fever, positive blood  culture EXAM: PORTABLE CHEST 1 VIEW COMPARISON:  Chest x-ray 12/14/2021 FINDINGS: The heart and mediastinal contours are unchanged. Aortic calcification. Cortical scarring of the right lower lung zone. No focal consolidation. No pulmonary edema. No pleural effusion. No pneumothorax. No acute osseous abnormality. Heterotopic calcification along the left proximal humerus. IMPRESSION: No active disease. Electronically Signed   By: Tish Frederickson M.D.   On: 12/16/2021 20:52   CT ABDOMEN PELVIS W CONTRAST  Result Date: 12/15/2021 CLINICAL DATA:  Nausea, vomiting, abdominal pain and fever. EXAM: CT ABDOMEN AND PELVIS WITH CONTRAST TECHNIQUE: Multidetector CT imaging of the abdomen and pelvis was performed using the standard protocol following bolus administration of intravenous contrast. RADIATION DOSE REDUCTION: This exam was performed according to the departmental dose-optimization program which includes automated exposure control, adjustment of the mA and/or kV according to patient size and/or use of iterative reconstruction technique. CONTRAST:  OMNIPAQUE IOHEXOL 300 MG/ML  SOLN COMPARISON:  September 29, 2014 FINDINGS: Lower chest: A stable area of partially calcified low attenuation is seen within the posteromedial aspect of the right lung base. A small, adjacent chronic right pleural effusion is seen. Hepatobiliary: No focal liver abnormality is seen. No gallstones, gallbladder wall thickening, or biliary dilatation. Pancreas: Unremarkable. No pancreatic ductal dilatation or surrounding inflammatory changes. Spleen: Normal in size without focal abnormality. Adrenals/Urinary Tract: Adrenal glands are unremarkable. Kidneys are normal in size, without renal calculi or hydronephrosis. Multiple bilateral renal cysts are seen. Bladder is unremarkable. Stomach/Bowel: Stomach is within normal limits. Appendix appears normal. A large amount of stool is seen throughout the colon. No evidence of bowel wall thickening,  distention, or inflammatory changes. Noninflamed diverticula are seen throughout the large bowel. Vascular/Lymphatic: Aortic atherosclerosis. No enlarged abdominal or pelvic lymph nodes. Reproductive: The prostate gland is markedly enlarged and heterogeneous in appearance. Other: There is a 2.8 cm x 1.8 cm fat containing right inguinal hernia. Multiple surrounding surgical coils are seen within this region. No abdominopelvic ascites. Musculoskeletal: Multilevel degenerative changes are seen throughout the lumbar spine. IMPRESSION: 1. Colonic diverticulosis. 2. Markedly enlarged and heterogeneous prostate gland. Correlation with PSA values is recommended. 3. Fat-containing right inguinal hernia. 4. Stable area of rounded atelectasis and associated small pleural effusion within the right lung base. 5. Aortic atherosclerosis. Aortic Atherosclerosis (ICD10-I70.0). Electronically Signed   By: Aram Candela M.D.   On: 12/15/2021 00:50   DG Chest 2 View  Result Date: 12/14/2021 CLINICAL DATA:  Fever, nausea and vomiting. EXAM: CHEST - 2 VIEW COMPARISON:  Chest plain film, dated June 03, 2020, and abdomen and pelvis CT,  dated September 29, 2014 FINDINGS: The heart size and mediastinal contours are within normal limits. There is marked severity calcification of the thoracic aorta. Mild, stable linear scarring and/or atelectasis is seen within the right lower lobe. An ill-defined focal opacity is seen overlying the medial aspect of the right lung base. This is seen on the prior study and is unchanged in appearance. The visualized skeletal structures are unremarkable. IMPRESSION: 1. Mild, stable right lower lobe linear scarring and/or atelectasis. 2. Findings which correspond to an area of rounded atelectasis seen within the posterior right lung base on the prior abdomen and pelvis CT. Electronically Signed   By: Aram Candela M.D.   On: 12/14/2021 22:38    Procedures Procedures   CRITICAL CARE Performed by:  Richardean Canal   Total critical care time: 30 minutes  Critical care time was exclusive of separately billable procedures and treating other patients.  Critical care was necessary to treat or prevent imminent or life-threatening deterioration.  Critical care was time spent personally by me on the following activities: development of treatment plan with patient and/or surrogate as well as nursing, discussions with consultants, evaluation of patient's response to treatment, examination of patient, obtaining history from patient or surrogate, ordering and performing treatments and interventions, ordering and review of laboratory studies, ordering and review of radiographic studies, pulse oximetry and re-evaluation of patient's condition.   Medications Ordered in ED Medications  cefTRIAXone (ROCEPHIN) 2 g in sodium chloride 0.9 % 100 mL IVPB (2 g Intravenous New Bag/Given 12/16/21 2114)    ED Course/ Medical Decision Making/ A&P                           Medical Decision Making Aaron Landry is a 88 y.o. male here for any with bacteremia.  Patient had E. coli bacteremia most recent blood culture.  Patient has subjective fevers at home.  I discussed case with the infectious disease doctor, Dr. Renold Don, and he recommend Rocephin and admission for IV antibiotics and repeat blood culture.  We will get CBC and CMP and lactate and blood and urine cultures.  We will give Rocephin  9:58 PM I reviewed labs and chest x-ray. White blood cell count is normal.  Repeat blood cultures sent.  Ordered Rocephin and hospitalist to admit for bacteremia  Problems Addressed: Bacteremia: acute illness or injury  Amount and/or Complexity of Data Reviewed Labs: ordered. Decision-making details documented in ED Course. Radiology: ordered and independent interpretation performed. Decision-making details documented in ED Course.  Risk Decision regarding hospitalization.    Final Clinical Impression(s) / ED  Diagnoses Final diagnoses:  None    Rx / DC Orders ED Discharge Orders     None         Charlynne Pander, MD 12/16/21 2159    Charlynne Pander, MD 12/16/21 2159

## 2021-12-16 NOTE — ED Notes (Signed)
Pt resting and watching TV, NAD noted, even RR and unlabored, call bell within reach for assistance, side rails up x2 for safety, pt voiced no concerns or questions at this time, care on going, will continue to monitor. Family at the bedside  

## 2021-12-16 NOTE — ED Triage Notes (Signed)
Patient brought in by Son via POV. Per son RN called today because of Positive blood culture that was drawn 2 days ago here in [facility]. Patient denies N/V. Patiend denies fever. Patient a/ox4.

## 2021-12-17 ENCOUNTER — Encounter (HOSPITAL_COMMUNITY): Payer: Self-pay | Admitting: Internal Medicine

## 2021-12-17 DIAGNOSIS — B9620 Unspecified Escherichia coli [E. coli] as the cause of diseases classified elsewhere: Secondary | ICD-10-CM | POA: Diagnosis not present

## 2021-12-17 DIAGNOSIS — R7881 Bacteremia: Secondary | ICD-10-CM | POA: Diagnosis not present

## 2021-12-17 LAB — CBC WITH DIFFERENTIAL/PLATELET
Abs Immature Granulocytes: 0.03 10*3/uL (ref 0.00–0.07)
Basophils Absolute: 0 10*3/uL (ref 0.0–0.1)
Basophils Relative: 1 %
Eosinophils Absolute: 0.5 10*3/uL (ref 0.0–0.5)
Eosinophils Relative: 6 %
HCT: 34.1 % — ABNORMAL LOW (ref 39.0–52.0)
Hemoglobin: 11.7 g/dL — ABNORMAL LOW (ref 13.0–17.0)
Immature Granulocytes: 0 %
Lymphocytes Relative: 19 %
Lymphs Abs: 1.5 10*3/uL (ref 0.7–4.0)
MCH: 31.5 pg (ref 26.0–34.0)
MCHC: 34.3 g/dL (ref 30.0–36.0)
MCV: 91.9 fL (ref 80.0–100.0)
Monocytes Absolute: 0.7 10*3/uL (ref 0.1–1.0)
Monocytes Relative: 9 %
Neutro Abs: 5.3 10*3/uL (ref 1.7–7.7)
Neutrophils Relative %: 65 %
Platelets: 207 10*3/uL (ref 150–400)
RBC: 3.71 MIL/uL — ABNORMAL LOW (ref 4.22–5.81)
RDW: 13.4 % (ref 11.5–15.5)
WBC: 8.1 10*3/uL (ref 4.0–10.5)
nRBC: 0 % (ref 0.0–0.2)

## 2021-12-17 LAB — COMPREHENSIVE METABOLIC PANEL
ALT: 23 U/L (ref 0–44)
AST: 35 U/L (ref 15–41)
Albumin: 3.1 g/dL — ABNORMAL LOW (ref 3.5–5.0)
Alkaline Phosphatase: 70 U/L (ref 38–126)
Anion gap: 10 (ref 5–15)
BUN: 36 mg/dL — ABNORMAL HIGH (ref 8–23)
CO2: 22 mmol/L (ref 22–32)
Calcium: 8.2 mg/dL — ABNORMAL LOW (ref 8.9–10.3)
Chloride: 105 mmol/L (ref 98–111)
Creatinine, Ser: 1.78 mg/dL — ABNORMAL HIGH (ref 0.61–1.24)
GFR, Estimated: 35 mL/min — ABNORMAL LOW (ref >60)
Glucose, Bld: 129 mg/dL — ABNORMAL HIGH (ref 70–99)
Potassium: 3.7 mmol/L (ref 3.5–5.1)
Sodium: 137 mmol/L (ref 135–145)
Total Bilirubin: 1 mg/dL (ref 0.3–1.2)
Total Protein: 6.6 g/dL (ref 6.5–8.1)

## 2021-12-17 LAB — PHOSPHORUS: Phosphorus: 2.6 mg/dL (ref 2.5–4.6)

## 2021-12-17 LAB — HEMOGLOBIN A1C
Hgb A1c MFr Bld: 7.2 % — ABNORMAL HIGH (ref 4.8–5.6)
Mean Plasma Glucose: 159.94 mg/dL

## 2021-12-17 LAB — GLUCOSE, CAPILLARY: Glucose-Capillary: 200 mg/dL — ABNORMAL HIGH (ref 70–99)

## 2021-12-17 LAB — MAGNESIUM: Magnesium: 1.5 mg/dL — ABNORMAL LOW (ref 1.7–2.4)

## 2021-12-17 MED ORDER — LACTATED RINGERS IV SOLN: INTRAVENOUS | Status: AC

## 2021-12-17 NOTE — ED Notes (Signed)
Pt given additional warm blanket and two bottles of water

## 2021-12-17 NOTE — Progress Notes (Signed)
PROGRESS NOTE    Aaron Landry  DQQ:[id number] DOB: Nov 16, 1924 DOA: 12/16/2021 PCP: Geoffry Paradise, MD   Brief Narrative: 88 year old with past medical history significant for recurrent UTIs, BPH, recent visit to the ED 7/1 who presented to [facility] from home due to positive blood cultures, E. coli bacteremia from blood cultures drawn on 12/14/2021 during ED visit. He visited the ED on 7/1 complaining of nausea, vomiting and inability to urinate.    Assessment & Plan:   Principal Problem:   Bacteremia due to Escherichia coli  1-E. coli bacteremia: Cultures from 12/14/2021: one of two Positive for E. Coli Follow repeated blood cultures 7/3: Urine culture 7/01 no growth to date.  CT abdomen pelvis 7/02: Colonic diverticulosis, markedly enlarged and heterogeneous prostate gland.  Stable area of rounded atelectasis and associated small pleural effusion within the right lung base. -Continue with IV Ceftriaxone.    AKI on CKD IIIB; Last cr 1.5  Cr peak to 1.8.  Continue with IV fluids.  Having difficulty voiding. Might need foley catheter   Marked enlarge prostate Gland on CT Continue with Flomax.  He will need follow up for this. .   Lactic acidosis: in setting of infection.  Continue with IV fluids and IV antibiotics.   Anemia of chronic kidney disease: Monitor hb, stable.   Diabetes.  Hyperglycemia: A1c: 7.2 Diet controlled.   Generalized  weakness: In setting of infection.  PT OT evaluation.   Hypomagnesemia ; replete IV>    Estimated body mass index is 18.99 kg/m as calculated from the following:   Height as of this encounter: 6' (1.829 m).   Weight as of this encounter: 63.5 kg.   DVT prophylaxis: Lovenox Code Status: Full code Family Communication: No family at bedside  Disposition Plan:  Status is: Inpatient Remains inpatient appropriate because: management of infection.     Consultants:  None  Procedures:  None  Antimicrobials:     Subjective: He is alert, report needs to urinate.  Denies pain.   Objective: Vitals:   12/17/21 0200 12/17/21 0300 12/17/21 0400 12/17/21 0520  BP: (!) 144/86 (!) 155/80 (!) 177/103 136/82  Pulse: 69 63 81 73  Resp: 15 13 18 15   Temp:      TempSrc:      SpO2: 100% 98% 100% 100%  Weight:      Height:        Intake/Output Summary (Last 24 hours) at 12/17/2021 0734 Last data filed at 12/17/2021 0100 Gross per 24 hour  Intake 100 ml  Output 1100 ml  Net -1000 ml   Filed Weights   12/16/21 1837  Weight: 63.5 kg    Examination:  General exam: Appears calm and comfortable  Respiratory system: Clear to auscultation. Respiratory effort normal. Cardiovascular system: S1 & S2 heard, RRR.  Gastrointestinal system: Abdomen is nondistended, soft and nontender. No organomegaly or masses felt. Normal bowel sounds heard. Central nervous system: Alert and oriented.  Extremities: Symmetric 5 x 5 power.    Data Reviewed: I have personally reviewed following labs and imaging studies  CBC: Recent Labs  Lab 12/14/21 2208 12/16/21 2102 12/17/21 0500  WBC 12.3* 8.3 8.1  NEUTROABS 11.6* 5.4 5.3  HGB 12.2* 11.3* 11.7*  HCT 36.1* 34.5* 34.1*  MCV 93.3 94.0 91.9  PLT 209 201 207   Basic Metabolic Panel: Recent Labs  Lab 12/14/21 2208 12/16/21 2102 12/17/21 0500  NA 138 137 137  K 3.6 4.0 3.7  CL 108 107 105  CO2 17* 22 22  GLUCOSE 174* 141* 129*  BUN 34* 45* 36*  CREATININE 1.60* 1.84* 1.78*  CALCIUM 8.8* 8.3* 8.2*  MG  --   --  1.5*  PHOS  --   --  2.6   GFR: Estimated Creatinine Clearance: 22.8 mL/min (A) (by C-G formula based on SCr of 1.78 mg/dL (H)). Liver Function Tests: Recent Labs  Lab 12/14/21 2208 12/16/21 2102 12/17/21 0500  AST 25 31 35  ALT 17 21 23   ALKPHOS 88 77 70  BILITOT 1.2 0.5 1.0  PROT 7.0 6.9 6.6  ALBUMIN 3.6 3.6 3.1*   No results for input(s): "LIPASE", "AMYLASE" in the last 168 hours. No results for input(s): "AMMONIA" in the last  168 hours. Coagulation Profile: Recent Labs  Lab 12/14/21 2208  INR 1.1   Cardiac Enzymes: No results for input(s): "CKTOTAL", "CKMB", "CKMBINDEX", "TROPONINI" in the last 168 hours. BNP (last 3 results) No results for input(s): "PROBNP" in the last 8760 hours. HbA1C: Recent Labs    12/17/21 0500  HGBA1C 7.2*   CBG: No results for input(s): "GLUCAP" in the last 168 hours. Lipid Profile: No results for input(s): "CHOL", "HDL", "LDLCALC", "TRIG", "CHOLHDL", "LDLDIRECT" in the last 72 hours. Thyroid Function Tests: No results for input(s): "TSH", "T4TOTAL", "FREET4", "T3FREE", "THYROIDAB" in the last 72 hours. Anemia Panel: No results for input(s): "VITAMINB12", "FOLATE", "FERRITIN", "TIBC", "IRON", "RETICCTPCT" in the last 72 hours. Sepsis Labs: Recent Labs  Lab 12/14/21 2208 12/15/21 0120 12/16/21 2102 12/16/21 2235  LATICACIDVEN 2.4* 1.8 2.8* 2.8*    Recent Results (from the past 240 hour(s))  Culture, blood (Routine x 2)     Status: None (Preliminary result)   Collection Time: 12/14/21 10:08 PM   Specimen: BLOOD  Result Value Ref Range Status   Specimen Description   Final    BLOOD LEFT ANTECUBITAL Performed at [facility], [address] [place], [place]    Special Requests   Final    BOTTLES DRAWN AEROBIC ONLY Blood Culture results may not be optimal due to an inadequate volume of blood received in culture bottles Performed at [facility], [address] [place], [place]    Culture   Final    NO GROWTH 2 DAYS Performed at [facility], [address], [place], [place]    Report Status PENDING  Incomplete  Culture, blood (Routine x 2)     Status: None (Preliminary result)   Collection Time: 12/14/21 10:30 PM   Specimen: BLOOD  Result Value Ref Range Status   Specimen Description   Final    BLOOD RIGHT ANTECUBITAL Performed at [facility], [address],  [place], [place]    Special Requests   Final    BOTTLES DRAWN AEROBIC AND ANAEROBIC Blood Culture adequate volume Performed at [facility], [address] [place], [place]    Culture  Setup Time   Final    GRAM NEGATIVE RODS AEROBIC BOTTLE ONLY CRITICAL RESULT CALLED TO, READ BACK BY AND VERIFIED WITH: RN [id number] [phone] @1652  FH Performed at [facility], [address] [place], [address] [place]    Culture GRAM NEGATIVE RODS  Final   Report Status PENDING  Incomplete  Blood Culture ID Panel (Reflexed)     Status: Abnormal   Collection Time: 12/14/21 10:30 PM  Result Value Ref Range Status   Enterococcus faecalis NOT DETECTED NOT  DETECTED Final   Enterococcus Faecium NOT DETECTED NOT DETECTED Final   Listeria monocytogenes NOT DETECTED NOT DETECTED Final   Staphylococcus species NOT DETECTED NOT DETECTED Final   Staphylococcus aureus (BCID) NOT DETECTED NOT DETECTED Final   Staphylococcus epidermidis NOT DETECTED NOT DETECTED Final   Staphylococcus lugdunensis NOT DETECTED NOT DETECTED Final   Streptococcus species NOT DETECTED NOT DETECTED Final   Streptococcus agalactiae NOT DETECTED NOT DETECTED Final   Streptococcus pneumoniae NOT DETECTED NOT DETECTED Final   Streptococcus pyogenes NOT DETECTED NOT DETECTED Final   A.calcoaceticus-baumannii NOT DETECTED NOT DETECTED Final   Bacteroides fragilis NOT DETECTED NOT DETECTED Final   Enterobacterales DETECTED (A) NOT DETECTED Final    Comment: Enterobacterales represent a large order of gram negative bacteria, not a single organism. CRITICAL RESULT CALLED TO, READ BACK BY AND VERIFIED WITH: RN A. NOAH [phone] @1652  FH    Enterobacter cloacae complex NOT DETECTED NOT DETECTED Final   Escherichia coli DETECTED (A) NOT DETECTED Final    Comment: CRITICAL RESULT CALLED TO, READ BACK BY AND VERIFIED WITH: RN A. NOAH [phone] @1652  FH    Klebsiella aerogenes NOT DETECTED NOT DETECTED Final    Klebsiella oxytoca NOT DETECTED NOT DETECTED Final   Klebsiella pneumoniae NOT DETECTED NOT DETECTED Final   Proteus species NOT DETECTED NOT DETECTED Final   Salmonella species NOT DETECTED NOT DETECTED Final   Serratia marcescens NOT DETECTED NOT DETECTED Final   Haemophilus influenzae NOT DETECTED NOT DETECTED Final   Neisseria meningitidis NOT DETECTED NOT DETECTED Final   Pseudomonas aeruginosa NOT DETECTED NOT DETECTED Final   Stenotrophomonas maltophilia NOT DETECTED NOT DETECTED Final   Candida albicans NOT DETECTED NOT DETECTED Final   Candida auris NOT DETECTED NOT DETECTED Final   Candida glabrata NOT DETECTED NOT DETECTED Final   Candida krusei NOT DETECTED NOT DETECTED Final   Candida parapsilosis NOT DETECTED NOT DETECTED Final   Candida tropicalis NOT DETECTED NOT DETECTED Final   Cryptococcus neoformans/gattii NOT DETECTED NOT DETECTED Final   CTX-M ESBL NOT DETECTED NOT DETECTED Final   Carbapenem resistance IMP NOT DETECTED NOT DETECTED Final   Carbapenem resistance KPC NOT DETECTED NOT DETECTED Final   Carbapenem resistance NDM NOT DETECTED NOT DETECTED Final   Carbapenem resist OXA 48 LIKE NOT DETECTED NOT DETECTED Final   Carbapenem resistance VIM NOT DETECTED NOT DETECTED Final    Comment: Performed at [facility], [address], [place], [address] [place]  Urine Culture     Status: None   Collection Time: 12/14/21 11:25 PM   Specimen: Urine, Clean Catch  Result Value Ref Range Status   Specimen Description   Final    URINE, CLEAN CATCH Performed at [facility], [address] [place], [place]    Special Requests   Final    NONE Performed at [facility], [address] [place], [place]    Culture   Final    NO GROWTH Performed at [facility], [address], [place], [address] [place]    Report Status 12/16/2021 FINAL  Final  Blood culture (routine x 2)     Status: None  (Preliminary result)   Collection Time: 12/16/21  9:04 PM   Specimen: BLOOD RIGHT HAND  Result Value Ref Range Status   Specimen Description   Final    BLOOD RIGHT HAND Performed at [facility], [address] [place], [place]  Special Requests   Final    BOTTLES DRAWN AEROBIC AND ANAEROBIC Blood Culture adequate volume Performed at [facility], [address], [place], [place] [address]    Culture   Final    NO GROWTH < 12 HOURS Performed at [facility], [address] [place], [place] [address]    Report Status PENDING  Incomplete  Blood culture (routine x 2)     Status: None (Preliminary result)   Collection Time: 12/16/21  9:06 PM   Specimen: BLOOD LEFT FOREARM  Result Value Ref Range Status   Specimen Description   Final    BLOOD LEFT FOREARM Performed at [facility], [address], [place], [place] [address]    Special Requests   Final    BOTTLES DRAWN AEROBIC AND ANAEROBIC Blood Culture adequate volume Performed at [facility], [address] [place], [place] [address]    Culture   Final    NO GROWTH < 12 HOURS Performed at [facility], [address], [place], [place] [address]    Report Status PENDING  Incomplete         Radiology Studies: DG Chest Port 1 View  Result Date: 12/16/2021 CLINICAL DATA:  fever, positive blood culture EXAM: PORTABLE CHEST 1 VIEW COMPARISON:  Chest x-ray 12/14/2021 FINDINGS: The heart and mediastinal contours are unchanged. Aortic calcification. Cortical scarring of the right lower lung zone. No focal consolidation. No pulmonary edema. No pleural effusion. No pneumothorax. No acute osseous abnormality. Heterotopic calcification along the left proximal humerus. IMPRESSION: No active disease. Electronically Signed   By: Tish Frederickson M.D.   On: 12/16/2021 20:52        Scheduled Meds:  enoxaparin (LOVENOX) injection  30 mg  Subcutaneous Q24H   tamsulosin  0.4 mg Oral Daily   Continuous Infusions:  cefTRIAXone (ROCEPHIN)  IV     lactated ringers 50 mL/hr at 12/16/21 2238     LOS: 1 day    Time spent: 35 minutes.     Alba Cory, MD [facility]   If 7PM-7AM, please contact night-coverage [email or web address]  12/17/2021, 7:34 AM

## 2021-12-17 NOTE — ED Notes (Signed)
Pt reported via call light he was through sitting on the bedpan. Noted pt had large BM. This Clinical research associate assisted patient with toilet hygiene. This writer placed a clean drawsheet and clean pad under patient.

## 2021-12-17 NOTE — ED Notes (Signed)
Pt given lunch tray. Ate 90% of tray.

## 2021-12-17 NOTE — ED Notes (Signed)
Pt appears to be sleeping, even RR and unlabored, NAD noted, call bell in reach, side rails up x2 for safety, care on going, will continue to monitor. 

## 2021-12-17 NOTE — ED Notes (Signed)
Pt given breakfast tray

## 2021-12-17 NOTE — ED Notes (Signed)
Pt ambulatory to restroom

## 2021-12-17 NOTE — ED Notes (Signed)
Pt given a urinal.

## 2021-12-18 DIAGNOSIS — B9620 Unspecified Escherichia coli [E. coli] as the cause of diseases classified elsewhere: Secondary | ICD-10-CM | POA: Diagnosis not present

## 2021-12-18 DIAGNOSIS — R7881 Bacteremia: Secondary | ICD-10-CM | POA: Diagnosis not present

## 2021-12-18 LAB — COMPREHENSIVE METABOLIC PANEL
ALT: 22 U/L (ref 0–44)
AST: 26 U/L (ref 15–41)
Albumin: 3.3 g/dL — ABNORMAL LOW (ref 3.5–5.0)
Alkaline Phosphatase: 83 U/L (ref 38–126)
Anion gap: 8 (ref 5–15)
BUN: 30 mg/dL — ABNORMAL HIGH (ref 8–23)
CO2: 23 mmol/L (ref 22–32)
Calcium: 8.5 mg/dL — ABNORMAL LOW (ref 8.9–10.3)
Chloride: 107 mmol/L (ref 98–111)
Creatinine, Ser: 1.83 mg/dL — ABNORMAL HIGH (ref 0.61–1.24)
GFR, Estimated: 34 mL/min — ABNORMAL LOW (ref >60)
Glucose, Bld: 200 mg/dL — ABNORMAL HIGH (ref 70–99)
Potassium: 3.6 mmol/L (ref 3.5–5.1)
Sodium: 138 mmol/L (ref 135–145)
Total Bilirubin: 0.5 mg/dL (ref 0.3–1.2)
Total Protein: 6.9 g/dL (ref 6.5–8.1)

## 2021-12-18 LAB — CBC WITH DIFFERENTIAL/PLATELET
Abs Immature Granulocytes: 0.04 10*3/uL (ref 0.00–0.07)
Basophils Absolute: 0.1 10*3/uL (ref 0.0–0.1)
Basophils Relative: 1 %
Eosinophils Absolute: 0.3 10*3/uL (ref 0.0–0.5)
Eosinophils Relative: 6 %
HCT: 34.6 % — ABNORMAL LOW (ref 39.0–52.0)
Hemoglobin: 12 g/dL — ABNORMAL LOW (ref 13.0–17.0)
Immature Granulocytes: 1 %
Lymphocytes Relative: 19 %
Lymphs Abs: 1 10*3/uL (ref 0.7–4.0)
MCH: 31.7 pg (ref 26.0–34.0)
MCHC: 34.7 g/dL (ref 30.0–36.0)
MCV: 91.3 fL (ref 80.0–100.0)
Monocytes Absolute: 0.4 10*3/uL (ref 0.1–1.0)
Monocytes Relative: 8 %
Neutro Abs: 3.5 10*3/uL (ref 1.7–7.7)
Neutrophils Relative %: 65 %
Platelets: 170 10*3/uL (ref 150–400)
RBC: 3.79 MIL/uL — ABNORMAL LOW (ref 4.22–5.81)
RDW: 13.4 % (ref 11.5–15.5)
WBC: 5.4 10*3/uL (ref 4.0–10.5)
nRBC: 0 % (ref 0.0–0.2)

## 2021-12-18 LAB — GLUCOSE, CAPILLARY
Glucose-Capillary: 117 mg/dL — ABNORMAL HIGH (ref 70–99)
Glucose-Capillary: 164 mg/dL — ABNORMAL HIGH (ref 70–99)

## 2021-12-18 LAB — LACTIC ACID, PLASMA
Lactic Acid, Venous: 0.8 mmol/L (ref 0.5–1.9)
Lactic Acid, Venous: 1.6 mmol/L (ref 0.5–1.9)

## 2021-12-18 LAB — URINE CULTURE: Culture: NO GROWTH

## 2021-12-18 LAB — MAGNESIUM: Magnesium: 1.7 mg/dL (ref 1.7–2.4)

## 2021-12-18 MED ORDER — ENOXAPARIN SODIUM 30 MG/0.3ML IJ SOSY
30.0000 mg | PREFILLED_SYRINGE | INTRAMUSCULAR | Status: DC
  Administered 2021-12-18 – 2021-12-21 (×4): 30 mg via SUBCUTANEOUS
  Filled 2021-12-18 (×4): qty 0.3

## 2021-12-18 MED ORDER — INSULIN ASPART 100 UNIT/ML IJ SOLN
0.0000 [IU] | INTRAMUSCULAR | Status: DC
  Administered 2021-12-18: 2 [IU] via SUBCUTANEOUS
  Administered 2021-12-19: 3 [IU] via SUBCUTANEOUS
  Administered 2021-12-19 – 2021-12-20 (×3): 2 [IU] via SUBCUTANEOUS
  Administered 2021-12-20: 3 [IU] via SUBCUTANEOUS
  Administered 2021-12-20: 2 [IU] via SUBCUTANEOUS
  Administered 2021-12-21: 1 [IU] via SUBCUTANEOUS
  Administered 2021-12-21 (×4): 2 [IU] via SUBCUTANEOUS

## 2021-12-18 NOTE — Progress Notes (Signed)
PROGRESS NOTE    Aaron Landry  [id number] DOB: 03-01-1925 DOA: 12/16/2021 PCP: Burnard Bunting, MD     Brief Narrative:  88 yo WM PMHx recurrent UTIs, BPH, recent visit to the ED 7/1   Presented to [facility] from home due to positive blood cultures, E. coli bacteremia from blood cultures drawn on 12/14/2021 during ED visit. He visited the ED on 7/1 complaining of nausea, vomiting and inability to urinate.   Subjective: Afebrile overnight.  A/O x3 (does not know when).  Pleasant follows all commands   Assessment & Plan: Covid vaccination;   Principal Problem:   Bacteremia due to Escherichia coli   Positive E. coli Bacteremia -Cultures from 12/14/2021: one of two Positive for E. Coli -Follow repeated blood cultures 7/3: -Urine culture 7/01 no growth to date.  -CT abdomen pelvis 7/02: Colonic diverticulosis, markedly enlarged and heterogeneous prostate gland.  Stable area of rounded atelectasis and associated small pleural effusion within the right lung base. -Complete 7-day course antibiotics given patient's prostate..      Acute on CKD STAGE IIIB; (baseline Cr 1.8) Lab Results  Component Value Date   CREATININE 1.83 (H) 12/18/2021   CREATININE 1.78 (H) 12/17/2021   CREATININE 1.84 (H) 12/16/2021   CREATININE 1.60 (H) 12/14/2021   CREATININE 1.57 (H) 06/05/2020  -At baseline   Marked enlarge prostate Gland on CT/Acute urine retention -Flomax 0.4 mg daily - Strict in and out - Daily weight  -Establish care urology upon discharge   lactic acidosis:  -In setting of infection.  Continue with IV fluids and IV antibiotics.    Anemia of chronic kidney disease: Lab Results  Component Value Date   HGB 12.0 (L) 12/18/2021   HGB 11.7 (L) 12/17/2021   HGB 11.3 (L) 12/16/2021   HGB 12.2 (L) 12/14/2021   HGB 9.9 (L) 06/05/2020    DM type II uncontrolled with hyperglycemia -7/4 hemoglobin A1c = 7.2 - 7/5 sensitive SSI   Generalized  weakness: -In setting of  infection.  -PT OT evaluation.    Hypomagnesemia -Magnesium goal> 2       Mobility Assessment (last 72 hours)     Mobility Assessment     Row Name 12/17/21 1936 12/17/21 1726 12/17/21 1700       Does patient have an order for bedrest or is patient medically unstable No - Continue assessment No - Continue assessment No - Continue assessment     What is the highest level of mobility based on the progressive mobility assessment? Level 5 (Walks with assist in room/hall) - Balance while stepping forward/back and can walk in room with assist - Complete Level 4 (Walks with assist in room) - Balance while marching in place and cannot step forward and back - Complete Level 4 (Walks with assist in room) - Balance while marching in place and cannot step forward and back - Complete                  DVT prophylaxis: Lovenox Code Status: Full Family Communication: 7/5 son at bedside for discussion of plan of care all questions answered Status is: Inpatient    Dispo: The patient is from: Home              Anticipated d/c is to: SNF              Anticipated d/c date is: 2 days              Patient currently is not medically  stable to d/c.      Consultants:    Procedures/Significant Events:    I have personally reviewed and interpreted all radiology studies and my findings are as above.  VENTILATOR SETTINGS:    Cultures   Antimicrobials: Anti-infectives (From admission, onward)    Start     Ordered Stop   12/17/21 2100  cefTRIAXone (ROCEPHIN) 2 g in sodium chloride 0.9 % 100 mL IVPB        12/16/21 2231     12/16/21 2100  cefTRIAXone (ROCEPHIN) 1 g in sodium chloride 0.9 % 100 mL IVPB  Status:  Discontinued        12/16/21 2047 12/16/21 2053   12/16/21 2100  cefTRIAXone (ROCEPHIN) 2 g in sodium chloride 0.9 % 100 mL IVPB        12/16/21 2053 12/16/21 2152         Devices    LINES / TUBES:      Continuous Infusions:  cefTRIAXone (ROCEPHIN)  IV Stopped  (12/17/21 2234)   lactated ringers 75 mL/hr at 12/17/21 1829     Objective: Vitals:   12/17/21 1701 12/17/21 2101 12/18/21 0112 12/18/21 0415  BP:  (!) 146/83 122/77 123/70  Pulse: 78 73 77 75  Resp:  16 16 18   Temp:  98.1 F (36.7 C) 98.1 F (36.7 C) 98.4 F (36.9 C)  TempSrc:  Oral Oral Oral  SpO2: 100% 100% 98% 97%  Weight:      Height:        Intake/Output Summary (Last 24 hours) at 12/18/2021 0716 Last data filed at 12/18/2021 0600 Gross per 24 hour  Intake 2390.37 ml  Output 1300 ml  Net 1090.37 ml   Filed Weights   12/16/21 1837  Weight: 63.5 kg    Examination:  General: A/O x3 (does not know when), No acute respiratory distress, cachectic Eyes: negative scleral hemorrhage, negative anisocoria, negative icterus ENT: Negative Runny nose, negative gingival bleeding, Neck:  Negative scars, masses, torticollis, lymphadenopathy, JVD Lungs: Clear to auscultation bilaterally without wheezes or crackles Cardiovascular: Regular rate and rhythm without murmur gallop or rub normal S1 and S2 Abdomen: negative abdominal pain, nondistended, positive soft, bowel sounds, no rebound, no ascites, no appreciable mass Extremities: No significant cyanosis, clubbing, or edema bilateral lower extremities Skin: Negative rashes, lesions, ulcers Psychiatric:  Negative depression, negative anxiety, negative fatigue, negative mania  Central nervous system:  Cranial nerves II through XII intact, tongue/uvula midline, all extremities muscle strength 5/5, sensation intact throughout, negative dysarthria, negative expressive aphasia, negative receptive aphasia.  .     Data Reviewed: Care during the described time interval was provided by me .  I have reviewed this patient's available data, including medical history, events of note, physical examination, and all test results as part of my evaluation.  CBC: Recent Labs  Lab 12/14/21 2208 12/16/21 2102 12/17/21 0500  WBC 12.3* 8.3 8.1   NEUTROABS 11.6* 5.4 5.3  HGB 12.2* 11.3* 11.7*  HCT 36.1* 34.5* 34.1*  MCV 93.3 94.0 91.9  PLT 209 201 207   Basic Metabolic Panel: Recent Labs  Lab 12/14/21 2208 12/16/21 2102 12/17/21 0500  NA 138 137 137  K 3.6 4.0 3.7  CL 108 107 105  CO2 17* 22 22  GLUCOSE 174* 141* 129*  BUN 34* 45* 36*  CREATININE 1.60* 1.84* 1.78*  CALCIUM 8.8* 8.3* 8.2*  MG  --   --  1.5*  PHOS  --   --  2.6   GFR: Estimated  Creatinine Clearance: 22.8 mL/min (A) (by C-G formula based on SCr of 1.78 mg/dL (H)). Liver Function Tests: Recent Labs  Lab 12/14/21 2208 12/16/21 2102 12/17/21 0500  AST 25 31 35  ALT 17 21 23   ALKPHOS 88 77 70  BILITOT 1.2 0.5 1.0  PROT 7.0 6.9 6.6  ALBUMIN 3.6 3.6 3.1*   No results for input(s): "LIPASE", "AMYLASE" in the last 168 hours. No results for input(s): "AMMONIA" in the last 168 hours. Coagulation Profile: Recent Labs  Lab 12/14/21 2208  INR 1.1   Cardiac Enzymes: No results for input(s): "CKTOTAL", "CKMB", "CKMBINDEX", "TROPONINI" in the last 168 hours. BNP (last 3 results) No results for input(s): "PROBNP" in the last 8760 hours. HbA1C: Recent Labs    12/17/21 0500  HGBA1C 7.2*   CBG: Recent Labs  Lab 12/17/21 2153  GLUCAP 200*   Lipid Profile: No results for input(s): "CHOL", "HDL", "LDLCALC", "TRIG", "CHOLHDL", "LDLDIRECT" in the last 72 hours. Thyroid Function Tests: No results for input(s): "TSH", "T4TOTAL", "FREET4", "T3FREE", "THYROIDAB" in the last 72 hours. Anemia Panel: No results for input(s): "VITAMINB12", "FOLATE", "FERRITIN", "TIBC", "IRON", "RETICCTPCT" in the last 72 hours. Sepsis Labs: Recent Labs  Lab 12/14/21 2208 12/15/21 0120 12/16/21 2102 12/16/21 2235  LATICACIDVEN 2.4* 1.8 2.8* 2.8*    Recent Results (from the past 240 hour(s))  Culture, blood (Routine x 2)     Status: None (Preliminary result)   Collection Time: 12/14/21 10:08 PM   Specimen: BLOOD  Result Value Ref Range Status   Specimen  Description   Final    BLOOD LEFT ANTECUBITAL Performed at [facility], [address], [place], [place] [address]    Special Requests   Final    BOTTLES DRAWN AEROBIC ONLY Blood Culture results may not be optimal due to an inadequate volume of blood received in culture bottles Performed at [facility], [address] [place], [place] [address]    Culture   Final    NO GROWTH 2 DAYS Performed at [facility], [address], [place], [place] [address]    Report Status PENDING  Incomplete  Culture, blood (Routine x 2)     Status: Abnormal (Preliminary result)   Collection Time: 12/14/21 10:30 PM   Specimen: BLOOD  Result Value Ref Range Status   Specimen Description   Final    BLOOD RIGHT ANTECUBITAL Performed at [facility], [address] [place], [place] [address]    Special Requests   Final    BOTTLES DRAWN AEROBIC AND ANAEROBIC Blood Culture adequate volume Performed at [facility], [address] [place], [place] [address]    Culture  Setup Time   Final    GRAM NEGATIVE RODS AEROBIC BOTTLE ONLY CRITICAL RESULT CALLED TO, READ BACK BY AND VERIFIED WITH: RN AAnette Riedel [phone] @1652  FH    Culture (A)  Final    ESCHERICHIA COLI SUSCEPTIBILITIES TO FOLLOW Performed at [facility], [address] [place], [place] [address]    Report Status PENDING  Incomplete  Blood Culture ID Panel (Reflexed)     Status: Abnormal   Collection Time: 12/14/21 10:30 PM  Result Value Ref Range Status   Enterococcus faecalis NOT DETECTED NOT DETECTED Final   Enterococcus Faecium NOT DETECTED NOT DETECTED Final   Listeria monocytogenes NOT DETECTED NOT DETECTED Final   Staphylococcus species NOT DETECTED NOT DETECTED Final   Staphylococcus aureus (BCID) NOT DETECTED NOT DETECTED Final  Staphylococcus epidermidis NOT DETECTED NOT DETECTED Final   Staphylococcus lugdunensis NOT DETECTED NOT DETECTED  Final   Streptococcus species NOT DETECTED NOT DETECTED Final   Streptococcus agalactiae NOT DETECTED NOT DETECTED Final   Streptococcus pneumoniae NOT DETECTED NOT DETECTED Final   Streptococcus pyogenes NOT DETECTED NOT DETECTED Final   A.calcoaceticus-baumannii NOT DETECTED NOT DETECTED Final   Bacteroides fragilis NOT DETECTED NOT DETECTED Final   Enterobacterales DETECTED (A) NOT DETECTED Final    Comment: Enterobacterales represent a large order of gram negative bacteria, not a single organism. CRITICAL RESULT CALLED TO, READ BACK BY AND VERIFIED WITH: RN A. NOAH [phone] @1652  FH    Enterobacter cloacae complex NOT DETECTED NOT DETECTED Final   Escherichia coli DETECTED (A) NOT DETECTED Final    Comment: CRITICAL RESULT CALLED TO, READ BACK BY AND VERIFIED WITH: RN A. NOAH [phone] @1652  FH    Klebsiella aerogenes NOT DETECTED NOT DETECTED Final   Klebsiella oxytoca NOT DETECTED NOT DETECTED Final   Klebsiella pneumoniae NOT DETECTED NOT DETECTED Final   Proteus species NOT DETECTED NOT DETECTED Final   Salmonella species NOT DETECTED NOT DETECTED Final   Serratia marcescens NOT DETECTED NOT DETECTED Final   Haemophilus influenzae NOT DETECTED NOT DETECTED Final   Neisseria meningitidis NOT DETECTED NOT DETECTED Final   Pseudomonas aeruginosa NOT DETECTED NOT DETECTED Final   Stenotrophomonas maltophilia NOT DETECTED NOT DETECTED Final   Candida albicans NOT DETECTED NOT DETECTED Final   Candida auris NOT DETECTED NOT DETECTED Final   Candida glabrata NOT DETECTED NOT DETECTED Final   Candida krusei NOT DETECTED NOT DETECTED Final   Candida parapsilosis NOT DETECTED NOT DETECTED Final   Candida tropicalis NOT DETECTED NOT DETECTED Final   Cryptococcus neoformans/gattii NOT DETECTED NOT DETECTED Final   CTX-M ESBL NOT DETECTED NOT DETECTED Final   Carbapenem resistance IMP NOT DETECTED NOT DETECTED Final   Carbapenem resistance KPC NOT DETECTED NOT DETECTED Final   Carbapenem  resistance NDM NOT DETECTED NOT DETECTED Final   Carbapenem resist OXA 48 LIKE NOT DETECTED NOT DETECTED Final   Carbapenem resistance VIM NOT DETECTED NOT DETECTED Final    Comment: Performed at [facility], [address], [place], [place] [address]  Urine Culture     Status: None   Collection Time: 12/14/21 11:25 PM   Specimen: Urine, Clean Catch  Result Value Ref Range Status   Specimen Description   Final    URINE, CLEAN CATCH Performed at [facility], [place] [address], [place], [place] [address]    Special Requests   Final    NONE Performed at [facility], [place] [address], [place], [place] [address]    Culture   Final    NO GROWTH Performed at [facility], [place] [address] [place], [place] [address]    Report Status 12/16/2021 FINAL  Final  Blood culture (routine x 2)     Status: None (Preliminary result)   Collection Time: 12/16/21  9:04 PM   Specimen: BLOOD RIGHT HAND  Result Value Ref Range Status   Specimen Description   Final    BLOOD RIGHT HAND Performed at [facility] [address] [place], [place] [address]    Special Requests   Final    BOTTLES DRAWN AEROBIC AND ANAEROBIC Blood Culture adequate volume Performed at [facility] [address], [place], [place] [address]    Culture   Final  NO GROWTH < 12 HOURS Performed at [facility], [address], [place], [place] [address]    Report Status PENDING  Incomplete  Blood culture (routine x 2)     Status: None (Preliminary result)   Collection Time: 12/16/21  9:06 PM   Specimen: BLOOD LEFT FOREARM  Result Value Ref Range Status   Specimen Description   Final    BLOOD LEFT FOREARM Performed at [facility], [address] [place], [place] [address]    Special Requests   Final    BOTTLES DRAWN AEROBIC AND ANAEROBIC Blood Culture adequate volume Performed at [facility],  [address], [place], [place] [address]    Culture   Final    NO GROWTH < 12 HOURS Performed at [facility], [address], [place], [place] [address]    Report Status PENDING  Incomplete  Urine Culture     Status: None   Collection Time: 12/16/21  9:10 PM   Specimen: Urine, Clean Catch  Result Value Ref Range Status   Specimen Description   Final    URINE, CLEAN CATCH Performed at [facility], [address], [place], [place] [address]    Special Requests   Final    NONE Performed at [facility], [address], [place], [place] [address]    Culture   Final    NO GROWTH Performed at [facility], [address], [place], [place] [address]    Report Status 12/18/2021 FINAL  Final         Radiology Studies: DG Chest Port 1 View  Result Date: 12/16/2021 CLINICAL DATA:  fever, positive blood culture EXAM: PORTABLE CHEST 1 VIEW COMPARISON:  Chest x-ray 12/14/2021 FINDINGS: The heart and mediastinal contours are unchanged. Aortic calcification. Cortical scarring of the right lower lung zone. No focal consolidation. No pulmonary edema. No pleural effusion. No pneumothorax. No acute osseous abnormality. Heterotopic calcification along the left proximal humerus. IMPRESSION: No active disease. Electronically Signed   By: Tish Frederickson M.D.   On: 12/16/2021 20:52        Scheduled Meds:  enoxaparin (LOVENOX) injection  30 mg Subcutaneous Q24H   tamsulosin  0.4 mg Oral Daily   Continuous Infusions:  cefTRIAXone (ROCEPHIN)  IV Stopped (12/17/21 2234)   lactated ringers 75 mL/hr at 12/17/21 1829     LOS: 2 days    Time spent:40 min    Gadiel John, Roselind Messier, MD [facility]   If 7PM-7AM, please contact night-coverage 12/18/2021, 7:16 AM

## 2021-12-18 NOTE — Plan of Care (Signed)

## 2021-12-18 NOTE — Evaluation (Signed)
Occupational Therapy Evaluation Patient Details Name: Aaron Landry MRN: [id number] DOB: [birth year] Today's Date: 12/18/2021   History of Present Illness 88 yo male admitted with bacteremia. Hx of CKD, anemia, DM   Clinical Impression   [name] is a 88 year old man who demonstrates the ability to perform Adls and functional mobility at his baseline. He reports feeling at his baseline with no complaints. Patient has intermittent assistance from his sons when he is discharged. Patient has no OT needs at this time.      Recommendations for follow up therapy are one component of a multi-disciplinary discharge planning process, led by the attending physician.  Recommendations may be updated based on patient status, additional functional criteria and insurance authorization.   Follow Up Recommendations  No OT follow up    Assistance Recommended at Discharge PRN  Patient can return home with the following Assistance with cooking/housework;Help with stairs or ramp for entrance;Assist for transportation    Functional Status Assessment  Patient has not had a recent decline in their functional status  Equipment Recommendations  None recommended by OT    Recommendations for Other Services       Precautions / Restrictions Precautions Precautions: Fall Precaution Comments: reports no hx of falls Restrictions Weight Bearing Restrictions: No      Mobility Bed Mobility               General bed mobility comments: up in chair    Transfers Overall transfer level: Needs assistance Equipment used: Straight cane Transfers: Sit to/from Stand Sit to Stand: Min guard           General transfer comment: min guard to ambulate in room with cane      Balance Overall balance assessment: Mild deficits observed, not formally tested                                         ADL either performed or assessed with clinical judgement   ADL Overall ADL's : At  baseline                                             Vision Patient Visual Report: No change from baseline       Perception     Praxis      Pertinent Vitals/Pain Pain Assessment Pain Assessment: No/denies pain     Hand Dominance Right   Extremity/Trunk Assessment Upper Extremity Assessment Upper Extremity Assessment: Overall WFL for tasks assessed   Lower Extremity Assessment Lower Extremity Assessment: Defer to PT evaluation   Cervical / Trunk Assessment Cervical / Trunk Assessment: Kyphotic   Communication Communication Communication: HOH   Cognition Arousal/Alertness: Awake/alert Behavior During Therapy: WFL for tasks assessed/performed Overall Cognitive Status: History of cognitive impairments - at baseline                                 General Comments: pt reports memory issues at baseline     General Comments       Exercises     Shoulder Instructions      Home Living Family/patient expects to be discharged to:: Private residence Living Arrangements: Alone Available Help at Discharge: Family Type of  Home: House                       Home Equipment: Gilmer Mor - single point   Additional Comments: intermittent - like - weekend assistance from sons      Prior Functioning/Environment Prior Level of Function : Independent/Modified Independent             Mobility Comments: uses cane PRN ADLs Comments: independent        OT Problem List:        OT Treatment/Interventions:      OT Goals(Current goals can be found in the care plan section) Acute Rehab OT Goals OT Goal Formulation: All assessment and education complete, DC therapy  OT Frequency:      Co-evaluation              AM-PAC OT "6 Clicks" Daily Activity     Outcome Measure Help from another person eating meals?: None Help from another person taking care of personal grooming?: None Help from another person toileting, which includes  using toliet, bedpan, or urinal?: None Help from another person bathing (including washing, rinsing, drying)?: None Help from another person to put on and taking off regular upper body clothing?: None Help from another person to put on and taking off regular lower body clothing?: None 6 Click Score: 24   End of Session Nurse Communication: Mobility status  Activity Tolerance: Patient tolerated treatment well Patient left: in chair;with call bell/phone within reach  OT Visit Diagnosis: Unsteadiness on feet (R26.81)                Time: 1141-1149 OT Time Calculation (min): 8 min Charges:  OT General Charges $OT Visit: 1 Visit OT Evaluation $OT Eval Low Complexity: 1 Low  Jaicee Michelotti, OTR/L Acute Care Rehab Services  Office [phone] Pager: [phone]   Kelli Churn 12/18/2021, 12:04 PM

## 2021-12-18 NOTE — TOC Initial Note (Signed)
Transition of Care [facility]) - Initial/Assessment Note    Patient Details  Name: Aaron Landry MRN: [id number] Date of Birth: [birth year]  Transition of Care [facility]) CM/SW Contact:    Vassie Moselle, LCSW Phone Number: 12/18/2021, 11:43 AM  Clinical Narrative:                 Met with pt and confirmed plans to return home with [facility] services. Pt also agreeable to rolling walker being delivered. HHPT/OT arranged with [facility]. [employer] has been contacted for rolling walker to be delivered to pt's room.   Expected Discharge Plan: [place] Barriers to Discharge: No Barriers Identified   Patient Goals and CMS Choice Patient states their goals for this hospitalization and ongoing recovery are:: To return home   Choice offered to / list presented to : Patient  Expected Discharge Plan and Services Expected Discharge Plan: [place] In-house Referral: Clinical Social Work Discharge Planning Services: CM Consult Post Acute Care Choice: Home Health, Durable Medical Equipment Living arrangements for the past 2 months: [place]                 DME Arranged: Walker rolling DME Agency: [facility]: PT, OT [place] Agency: [place] Date [place]: 12/18/21 Time HH Agency Contacted: 1140 Representative spoke with at [place]: Marjory Lies  Prior Living Arrangements/Services Living arrangements for the past 2 months: [place] with:: Self Patient language and need for interpreter reviewed:: Yes Do you feel safe going back to the place where you live?: Yes      Need for Family Participation in Patient Care: No (Comment) Care giver support system in place?: No (comment) Current home services: DME Criminal Activity/Legal Involvement Pertinent to Current Situation/Hospitalization: No - Comment as needed  Activities of Daily Living Home Assistive Devices/Equipment: Walker (specify type) ADL  Screening (condition at time of admission) Patient's cognitive ability adequate to safely complete daily activities?: Yes Is the patient deaf or have difficulty hearing?: Yes Does the patient have difficulty seeing, even when wearing glasses/contacts?: Yes Does the patient have difficulty concentrating, remembering, or making decisions?: Yes Patient able to express need for assistance with ADLs?: Yes Does the patient have difficulty dressing or bathing?: Yes Independently performs ADLs?: No Communication: Independent Dressing (OT): Dependent, Needs assistance Is this a change from baseline?: Pre-admission baseline Grooming: Needs assistance Is this a change from baseline?: Pre-admission baseline Feeding: Independent Is this a change from baseline?: Pre-admission baseline Bathing: Needs assistance Is this a change from baseline?: Pre-admission baseline Toileting: Needs assistance Is this a change from baseline?: Pre-admission baseline In/Out Bed: Independent Walks in Home: Independent, Independent with device (comment) Does the patient have difficulty walking or climbing stairs?: Yes Weakness of Legs: Both Weakness of Arms/Hands: None  Permission Sought/Granted Permission sought to share information with : Family Supports Permission granted to share information with : Yes, Verbal Permission Granted  Share Information with NAME: Aaron Landry     Permission granted to share info w Relationship: Son  Permission granted to share info w Contact Information: [phone]  Emotional Assessment Appearance:: Appears stated age Attitude/Demeanor/Rapport: Gracious Affect (typically observed): Accepting Orientation: : Oriented to Self, Oriented to Place, Oriented to  Time, Oriented to Situation Alcohol / Substance Use: Not Applicable Psych Involvement: No (comment)  Admission diagnosis:  Bacteremia [R78.81] Bacteremia due to Escherichia coli [R78.81, B96.20] Patient Active Problem  List  Diagnosis Date Noted   Bacteremia due to Escherichia coli 12/16/2021   Anemia of chronic disease 07/18/2021   Diabetic renal disease ([place]) 07/18/2021   Hyponatremia 07/18/2021   Leukocytosis 07/18/2021   Onychomycosis 38/18/2993   Acute metabolic encephalopathy 71/69/6789   Complicated UTI (urinary tract infection) 06/03/2020   Acute renal failure superimposed on stage 3b chronic kidney disease ([place]) 06/03/2020   Hypomagnesemia 06/03/2020   Essential hypertension 06/03/2020   GERD without esophagitis 06/03/2020   Chronic kidney disease due to hypertension 10/10/2019   Chronic kidney disease, stage 3b ([place]) 10/10/2019   Non-thrombocytopenic purpura ([place]) 11/10/2018   Pain in left knee 10/21/2017   Abnormal gait 01/28/2016   Encounter for general adult medical examination without abnormal findings 12/13/2014   Type 2 diabetes mellitus with stage 3b chronic kidney disease, without long-term current use of insulin ([place])    Hyperkalemia 08/24/2014   Liver abscess 08/24/2014   Abscess, liver    Osteoarthritis 09/28/2013   Diabetic peripheral neuropathy associated with type 2 diabetes mellitus ([place]) 07/12/2012   Degeneration of lumbar intervertebral disc 06/15/2012   Peripheral vascular disease, unspecified ([place]) 01/27/2012   ED (erectile dysfunction) of organic origin 07/16/2011   Elevated prostate specific antigen (PSA) 07/16/2011   Benign prostatic hyperplasia with lower urinary tract symptoms 06/07/2009   Hyperlipidemia 06/07/2009   ABSCESS, LIVER, PYOGENIC 10/20/2008   Diabetes ([place]) 10/19/2008   HYPERTENSION NEC 10/19/2008   Dehydration with hyponatremia 10/08/2008   Hypokalemia, inadequate intake 10/08/2008   PCP:  Burnard Bunting, MD Pharmacy:   [facility] [place], [place] [address] AT [facility] [address] Phone: [phone] Fax: [phone]  [place] [facility] [address], [place] [address] Terald Sleeper [place] [address] Phone: [phone] Fax: [phone]     Social Determinants of Health ([place]) Interventions    Readmission Risk Interventions    12/18/2021   11:38 AM  Readmission Risk Prevention Plan  Transportation Screening Complete  PCP or Specialist Appt within 5-7 Days Complete  Home Care Screening Complete  Medication Review (RN CM) Complete

## 2021-12-18 NOTE — Evaluation (Signed)
Physical Therapy Evaluation Patient Details Name: Aaron Landry MRN: [id number] DOB: 1924-06-29 Today's Date: 12/18/2021  History of Present Illness  88 yo male admitted with bacteremia. Hx of CKD, anemia, DM  Clinical Impression  On eval, pt was Min guard assist for mobility. He walked ~75 feet with a RW. He was pretty unsteady when standing so used RW for ambulation safety. He tends to take short, shuffling steps. He tolerated distance well. No family present during session. Recommend HHPT f/u as long as family is able to provide some supervision and assistance. Will plan to follow and progress activity as tolerated.        Recommendations for follow up therapy are one component of a multi-disciplinary discharge planning process, led by the attending physician.  Recommendations may be updated based on patient status, additional functional criteria and insurance authorization.  Follow Up Recommendations Home health PT      Assistance Recommended at Discharge Frequent or constant Supervision/Assistance  Patient can return home with the following  A little help with walking and/or transfers;A little help with bathing/dressing/bathroom;Assist for transportation;Assistance with cooking/housework;Help with stairs or ramp for entrance    Equipment Recommendations Rolling walker (2 wheels)  Recommendations for Other Services       Functional Status Assessment Patient has had a recent decline in their functional status and demonstrates the ability to make significant improvements in function in a reasonable and predictable amount of time.     Precautions / Restrictions Precautions Precautions: Fall Restrictions Weight Bearing Restrictions: No      Mobility  Bed Mobility Overal bed mobility: Modified Independent             General bed mobility comments: increased time.    Transfers Overall transfer level: Needs assistance Equipment used: Rolling walker (2  wheels) Transfers: Sit to/from Stand Sit to Stand: Min guard           General transfer comment: cues for safety, hand placement.    Ambulation/Gait Ambulation/Gait assistance: Min guard Gait Distance (Feet): 75 Feet Assistive device: Rolling walker (2 wheels) Gait Pattern/deviations: Step-through pattern, Decreased stride length, Shuffle       General Gait Details: min guard for safety. cues for safety, RW proximity, proper use of RW, and for pt to try to increase step lengths  Stairs            Wheelchair Mobility    Modified Rankin (Stroke Patients Only)       Balance Overall balance assessment: Needs assistance         Standing balance support: Bilateral upper extremity supported, Reliant on assistive device for balance, During functional activity Standing balance-Leahy Scale: Fair                               Pertinent Vitals/Pain Pain Assessment Pain Assessment: No/denies pain    Home Living Family/patient expects to be discharged to:: Private residence Living Arrangements: Alone Available Help at Discharge: Family Type of Home: House           Home Equipment: [place] Mor - single point      Prior Function Prior Level of Function : Independent/Modified Independent             Mobility Comments: uses cane PRN       Hand Dominance        Extremity/Trunk Assessment   Upper Extremity Assessment Upper Extremity Assessment: Defer to OT evaluation  Lower Extremity Assessment Lower Extremity Assessment: Generalized weakness    Cervical / Trunk Assessment Cervical / Trunk Assessment: Normal  Communication   Communication: HOH  Cognition Arousal/Alertness: Awake/alert Behavior During Therapy: WFL for tasks assessed/performed Overall Cognitive Status: History of cognitive impairments - at baseline                                 General Comments: pt reports memory issues at baseline        General  Comments      Exercises     Assessment/Plan    PT Assessment Patient needs continued PT services  PT Problem List Decreased strength;Decreased balance;Decreased activity tolerance;Decreased mobility;Decreased cognition;Decreased knowledge of use of DME       PT Treatment Interventions DME instruction;Gait training;Functional mobility training;Therapeutic activities;Balance training;Patient/family education;Therapeutic exercise    PT Goals (Current goals can be found in the Care Plan section)  Acute Rehab PT Goals Patient Stated Goal: none stated PT Goal Formulation: With patient Time For Goal Achievement: 01/01/22 Potential to Achieve Goals: Good    Frequency Min 3X/week     Co-evaluation               AM-PAC PT "6 Clicks" Mobility  Outcome Measure Help needed turning from your back to your side while in a flat bed without using bedrails?: None Help needed moving from lying on your back to sitting on the side of a flat bed without using bedrails?: None Help needed moving to and from a bed to a chair (including a wheelchair)?: A Little Help needed standing up from a chair using your arms (e.g., wheelchair or bedside chair)?: A Little Help needed to walk in hospital room?: A Little Help needed climbing 3-5 steps with a railing? : A Little 6 Click Score: 20    End of Session Equipment Utilized During Treatment: Gait belt Activity Tolerance: Patient tolerated treatment well Patient left: in chair;with call bell/phone within reach   PT Visit Diagnosis: Muscle weakness (generalized) (M62.81);Difficulty in walking, not elsewhere classified (R26.2);Unsteadiness on feet (R26.81)    Time: 0375-4360 PT Time Calculation (min) (ACUTE ONLY): 11 min   Charges:   PT Evaluation $PT Eval Moderate Complexity: 1 Mod             Faye Ramsay, PT Acute Rehabilitation  Office: [phone] Pager: [phone]

## 2021-12-18 NOTE — Progress Notes (Signed)
PHARMACY - PHYSICIAN COMMUNICATION CRITICAL VALUE ALERT - BLOOD CULTURE IDENTIFICATION (BCID)  BUCKLEY BRADLY is an 88 y.o. male who presented to [facility] on 12/16/2021 after blood cultures positive for 1/4 Ecoli.  Patient was seen at [facility] ED on 7/1 for fever, nausea, difficulty urinating.  History of recurrent UTI.  Assessment:  Blood cultures from 7/1: 1/3 bottles positive for GNR, BCID detects Ecoli. Now, 1/3 bottles drawn on 7/1 are positive for GPR. BCID will not be performed. Repeat blood cultures from 7/3 are no growth to date.   Name of physician (or Provider) Contacted: Dr. Joseph Art  Current antibiotics: ceftriaxone 2g IV q24 hours  Changes to prescribed antibiotics recommended:  Patient is on recommended antibiotics - No changes needed New gram positive rod is likely a contaminant.   Results for orders placed or performed during the hospital encounter of 12/14/21  Blood Culture ID Panel (Reflexed) (Collected: 12/14/2021 10:30 PM)  Result Value Ref Range   Enterococcus faecalis NOT DETECTED NOT DETECTED   Enterococcus Faecium NOT DETECTED NOT DETECTED   Listeria monocytogenes NOT DETECTED NOT DETECTED   Staphylococcus species NOT DETECTED NOT DETECTED   Staphylococcus aureus (BCID) NOT DETECTED NOT DETECTED   Staphylococcus epidermidis NOT DETECTED NOT DETECTED   Staphylococcus lugdunensis NOT DETECTED NOT DETECTED   Streptococcus species NOT DETECTED NOT DETECTED   Streptococcus agalactiae NOT DETECTED NOT DETECTED   Streptococcus pneumoniae NOT DETECTED NOT DETECTED   Streptococcus pyogenes NOT DETECTED NOT DETECTED   A.calcoaceticus-baumannii NOT DETECTED NOT DETECTED   Bacteroides fragilis NOT DETECTED NOT DETECTED   Enterobacterales DETECTED (A) NOT DETECTED   Enterobacter cloacae complex NOT DETECTED NOT DETECTED   Escherichia coli DETECTED (A) NOT DETECTED   Klebsiella aerogenes NOT DETECTED NOT DETECTED   Klebsiella oxytoca NOT DETECTED NOT DETECTED   Klebsiella  pneumoniae NOT DETECTED NOT DETECTED   Proteus species NOT DETECTED NOT DETECTED   Salmonella species NOT DETECTED NOT DETECTED   Serratia marcescens NOT DETECTED NOT DETECTED   Haemophilus influenzae NOT DETECTED NOT DETECTED   Neisseria meningitidis NOT DETECTED NOT DETECTED   Pseudomonas aeruginosa NOT DETECTED NOT DETECTED   Stenotrophomonas maltophilia NOT DETECTED NOT DETECTED   Candida albicans NOT DETECTED NOT DETECTED   Candida auris NOT DETECTED NOT DETECTED   Candida glabrata NOT DETECTED NOT DETECTED   Candida krusei NOT DETECTED NOT DETECTED   Candida parapsilosis NOT DETECTED NOT DETECTED   Candida tropicalis NOT DETECTED NOT DETECTED   Cryptococcus neoformans/gattii NOT DETECTED NOT DETECTED   CTX-M ESBL NOT DETECTED NOT DETECTED   Carbapenem resistance IMP NOT DETECTED NOT DETECTED   Carbapenem resistance KPC NOT DETECTED NOT DETECTED   Carbapenem resistance NDM NOT DETECTED NOT DETECTED   Carbapenem resist OXA 48 LIKE NOT DETECTED NOT DETECTED   Carbapenem resistance VIM NOT DETECTED NOT DETECTED    Rexford Maus, PharmD 12/18/2021 11:05 AM

## 2021-12-19 DIAGNOSIS — R7881 Bacteremia: Secondary | ICD-10-CM | POA: Diagnosis not present

## 2021-12-19 DIAGNOSIS — B9620 Unspecified Escherichia coli [E. coli] as the cause of diseases classified elsewhere: Secondary | ICD-10-CM | POA: Diagnosis not present

## 2021-12-19 LAB — CBC WITH DIFFERENTIAL/PLATELET
Abs Immature Granulocytes: 0.04 10*3/uL (ref 0.00–0.07)
Basophils Absolute: 0.1 10*3/uL (ref 0.0–0.1)
Basophils Relative: 1 %
Eosinophils Absolute: 0.5 10*3/uL (ref 0.0–0.5)
Eosinophils Relative: 8 %
HCT: 30.5 % — ABNORMAL LOW (ref 39.0–52.0)
Hemoglobin: 10.5 g/dL — ABNORMAL LOW (ref 13.0–17.0)
Immature Granulocytes: 1 %
Lymphocytes Relative: 35 %
Lymphs Abs: 2.2 10*3/uL (ref 0.7–4.0)
MCH: 31.3 pg (ref 26.0–34.0)
MCHC: 34.4 g/dL (ref 30.0–36.0)
MCV: 90.8 fL (ref 80.0–100.0)
Monocytes Absolute: 0.6 10*3/uL (ref 0.1–1.0)
Monocytes Relative: 9 %
Neutro Abs: 2.8 10*3/uL (ref 1.7–7.7)
Neutrophils Relative %: 46 %
Platelets: 207 10*3/uL (ref 150–400)
RBC: 3.36 MIL/uL — ABNORMAL LOW (ref 4.22–5.81)
RDW: 13.3 % (ref 11.5–15.5)
WBC: 6.1 10*3/uL (ref 4.0–10.5)
nRBC: 0 % (ref 0.0–0.2)

## 2021-12-19 LAB — COMPREHENSIVE METABOLIC PANEL
ALT: 22 U/L (ref 0–44)
AST: 31 U/L (ref 15–41)
Albumin: 3 g/dL — ABNORMAL LOW (ref 3.5–5.0)
Alkaline Phosphatase: 74 U/L (ref 38–126)
Anion gap: 8 (ref 5–15)
BUN: 28 mg/dL — ABNORMAL HIGH (ref 8–23)
CO2: 24 mmol/L (ref 22–32)
Calcium: 8 mg/dL — ABNORMAL LOW (ref 8.9–10.3)
Chloride: 106 mmol/L (ref 98–111)
Creatinine, Ser: 1.69 mg/dL — ABNORMAL HIGH (ref 0.61–1.24)
GFR, Estimated: 37 mL/min — ABNORMAL LOW (ref >60)
Glucose, Bld: 100 mg/dL — ABNORMAL HIGH (ref 70–99)
Potassium: 3.7 mmol/L (ref 3.5–5.1)
Sodium: 138 mmol/L (ref 135–145)
Total Bilirubin: 0.5 mg/dL (ref 0.3–1.2)
Total Protein: 5.9 g/dL — ABNORMAL LOW (ref 6.5–8.1)

## 2021-12-19 LAB — PHOSPHORUS: Phosphorus: 3.7 mg/dL (ref 2.5–4.6)

## 2021-12-19 LAB — GLUCOSE, CAPILLARY
Glucose-Capillary: 104 mg/dL — ABNORMAL HIGH (ref 70–99)
Glucose-Capillary: 165 mg/dL — ABNORMAL HIGH (ref 70–99)
Glucose-Capillary: 194 mg/dL — ABNORMAL HIGH (ref 70–99)
Glucose-Capillary: 223 mg/dL — ABNORMAL HIGH (ref 70–99)
Glucose-Capillary: 95 mg/dL (ref 70–99)
Glucose-Capillary: 97 mg/dL (ref 70–99)

## 2021-12-19 LAB — MAGNESIUM: Magnesium: 1.5 mg/dL — ABNORMAL LOW (ref 1.7–2.4)

## 2021-12-19 MED ORDER — CALCIUM GLUCONATE-NACL 2-0.675 GM/100ML-% IV SOLN
2.0000 g | Freq: Once | INTRAVENOUS | Status: AC
  Administered 2021-12-19: 2000 mg via INTRAVENOUS
  Filled 2021-12-19: qty 100

## 2021-12-19 MED ORDER — MAGNESIUM SULFATE 50 % IJ SOLN
3.0000 g | Freq: Once | INTRAVENOUS | Status: AC
  Administered 2021-12-19: 3 g via INTRAVENOUS
  Filled 2021-12-19: qty 6

## 2021-12-19 NOTE — Progress Notes (Signed)
PROGRESS NOTE    Aaron Landry  IOE:[id number] DOB: 11-18-1924 DOA: 12/16/2021 PCP: Geoffry Paradise, MD     Brief Narrative:  88 yo WM PMHx recurrent UTIs, BPH, recent visit to the ED 7/1   Presented to [facility] from home due to positive blood cultures, E. coli bacteremia from blood cultures drawn on 12/14/2021 during ED visit. He visited the ED on 7/1 complaining of nausea, vomiting and inability to urinate.   Subjective: 7/6 afebrile overnight.  A/O x3 (does not know when).  Remains pleasant follows all commands   Assessment & Plan: Covid vaccination;   Principal Problem:   Bacteremia due to Escherichia coli   Positive E. coli Bacteremia -Cultures from 12/14/2021: one of two Positive for E. Coli -Follow repeated blood cultures 7/3: -Urine culture 7/01 no growth to date.  -CT abdomen pelvis 7/02: Colonic diverticulosis, markedly enlarged and heterogeneous prostate gland.  Stable area of rounded atelectasis and associated small pleural effusion within the right lung base. -Complete 7-day course antibiotics given patient's prostate..      Acute on CKD STAGE IIIB; (baseline Cr 1.8) Lab Results  Component Value Date   CREATININE 1.69 (H) 12/19/2021   CREATININE 1.83 (H) 12/18/2021   CREATININE 1.78 (H) 12/17/2021   CREATININE 1.84 (H) 12/16/2021   CREATININE 1.60 (H) 12/14/2021  -At baseline   Marked enlarge prostate Gland on CT/Acute urine retention -Flomax 0.4 mg daily - Strict in and out - Daily weight  -Establish care urology upon discharge   lactic acidosis:  -In setting of infection.  Continue with IV fluids and IV antibiotics.    Anemia of chronic kidney disease: Lab Results  Component Value Date   HGB 10.5 (L) 12/19/2021   HGB 12.0 (L) 12/18/2021   HGB 11.7 (L) 12/17/2021   HGB 11.3 (L) 12/16/2021   HGB 12.2 (L) 12/14/2021    DM type II uncontrolled with hyperglycemia -7/4 hemoglobin A1c = 7.2 - 7/5 sensitive SSI   Generalized  weakness: -In  setting of infection.  -PT OT evaluation.    Hypomagnesemia -Magnesium goal> 2 -7/6 Magnesium IV 3 g  Hypocalcemia - Calcium goal> 8.9 - 7/6 corrected calcium= 8.8 - 7/6 calcium gluconate 2 g  Goals of care - PT/OT: Recommend home health, PT       Mobility Assessment (last 72 hours)     Mobility Assessment     Row Name 12/19/21 1829 12/18/21 1954 12/18/21 1202 12/18/21 1030 12/17/21 1936   Does patient have an order for bedrest or is patient medically unstable No - Continue assessment No - Continue assessment -- -- No - Continue assessment   What is the highest level of mobility based on the progressive mobility assessment? Level 5 (Walks with assist in room/hall) - Balance while stepping forward/back and can walk in room with assist - Complete Level 5 (Walks with assist in room/hall) - Balance while stepping forward/back and can walk in room with assist - Complete Level 5 (Walks with assist in room/hall) - Balance while stepping forward/back and can walk in room with assist - Complete Level 5 (Walks with assist in room/hall) - Balance while stepping forward/back and can walk in room with assist - Complete Level 5 (Walks with assist in room/hall) - Balance while stepping forward/back and can walk in room with assist - Complete   Is the above level different from baseline mobility prior to current illness? Yes - Recommend PT order Yes - Recommend PT order -- -- --  Row Name 12/17/21 1726 12/17/21 1700         Does patient have an order for bedrest or is patient medically unstable No - Continue assessment No - Continue assessment      What is the highest level of mobility based on the progressive mobility assessment? Level 4 (Walks with assist in room) - Balance while marching in place and cannot step forward and back - Complete Level 4 (Walks with assist in room) - Balance while marching in place and cannot step forward and back - Complete                   DVT  prophylaxis: Lovenox Code Status: Full Family Communication: 7/5 son at bedside for discussion of plan of care all questions answered Status is: Inpatient    Dispo: The patient is from: Home              Anticipated d/c is to: SNF              Anticipated d/c date is: 2 days              Patient currently is not medically stable to d/c.      Consultants:    Procedures/Significant Events:    I have personally reviewed and interpreted all radiology studies and my findings are as above.  VENTILATOR SETTINGS:    Cultures   Antimicrobials: Anti-infectives (From admission, onward)    Start     Ordered Stop   12/17/21 2100  cefTRIAXone (ROCEPHIN) 2 g in sodium chloride 0.9 % 100 mL IVPB        12/16/21 2231     12/16/21 2100  cefTRIAXone (ROCEPHIN) 1 g in sodium chloride 0.9 % 100 mL IVPB  Status:  Discontinued        12/16/21 2047 12/16/21 2053   12/16/21 2100  cefTRIAXone (ROCEPHIN) 2 g in sodium chloride 0.9 % 100 mL IVPB        12/16/21 2053 12/16/21 2152         Devices    LINES / TUBES:      Continuous Infusions:  cefTRIAXone (ROCEPHIN)  IV 2 g (12/18/21 2310)     Objective: Vitals:   12/18/21 0415 12/18/21 1330 12/18/21 2051 12/19/21 0348  BP: 123/70 (!) 147/79 (!) 162/80 127/71  Pulse: 75 69 71 79  Resp: 18  17 16   Temp: 98.4 F (36.9 C) (!) 97.4 F (36.3 C) 97.9 F (36.6 C) 98 F (36.7 C)  TempSrc: Oral Oral Oral Oral  SpO2: 97% 100% 99% 98%  Weight:    62.5 kg  Height:        Intake/Output Summary (Last 24 hours) at 12/19/2021 1107 Last data filed at 12/19/2021 0716 Gross per 24 hour  Intake 940 ml  Output 1100 ml  Net -160 ml    Filed Weights   12/16/21 1837 12/19/21 0348  Weight: 63.5 kg 62.5 kg    Examination:  General: A/O x3 (does not know when), No acute respiratory distress, cachectic Eyes: negative scleral hemorrhage, negative anisocoria, negative icterus ENT: Negative Runny nose, negative gingival bleeding, Neck:   Negative scars, masses, torticollis, lymphadenopathy, JVD Lungs: Clear to auscultation bilaterally without wheezes or crackles Cardiovascular: Regular rate and rhythm without murmur gallop or rub normal S1 and S2 Abdomen: negative abdominal pain, nondistended, positive soft, bowel sounds, no rebound, no ascites, no appreciable mass Extremities: No significant cyanosis, clubbing, or edema bilateral lower extremities Skin: Negative rashes, lesions,  ulcers Psychiatric:  Negative depression, negative anxiety, negative fatigue, negative mania  Central nervous system:  Cranial nerves II through XII intact, tongue/uvula midline, all extremities muscle strength 5/5, sensation intact throughout, negative dysarthria, negative expressive aphasia, negative receptive aphasia.  .     Data Reviewed: Care during the described time interval was provided by me .  I have reviewed this patient's available data, including medical history, events of note, physical examination, and all test results as part of my evaluation.  CBC: Recent Labs  Lab 12/14/21 2208 12/16/21 2102 12/17/21 0500 12/18/21 1255 12/19/21 0554  WBC 12.3* 8.3 8.1 5.4 6.1  NEUTROABS 11.6* 5.4 5.3 3.5 2.8  HGB 12.2* 11.3* 11.7* 12.0* 10.5*  HCT 36.1* 34.5* 34.1* 34.6* 30.5*  MCV 93.3 94.0 91.9 91.3 90.8  PLT 209 201 207 170 [id number]    Basic Metabolic Panel: Recent Labs  Lab 12/14/21 2208 12/16/21 2102 12/17/21 0500 12/18/21 1255 12/19/21 0554  NA 138 137 137 138 138  K 3.6 4.0 3.7 3.6 3.7  CL 108 107 105 107 106  CO2 17* 22 22 23 24   GLUCOSE 174* 141* 129* 200* 100*  BUN 34* 45* 36* 30* 28*  CREATININE 1.60* 1.84* 1.78* 1.83* 1.69*  CALCIUM 8.8* 8.3* 8.2* 8.5* 8.0*  MG  --   --  1.5* 1.7 1.5*  PHOS  --   --  2.6  --  3.7    GFR: Estimated Creatinine Clearance: 23.6 mL/min (A) (by C-G formula based on SCr of 1.69 mg/dL (H)). Liver Function Tests: Recent Labs  Lab 12/14/21 2208 12/16/21 2102 12/17/21 0500  12/18/21 1255 12/19/21 0554  AST 25 31 35 26 31  ALT 17 21 23 22 22   ALKPHOS 88 77 70 83 74  BILITOT 1.2 0.5 1.0 0.5 0.5  PROT 7.0 6.9 6.6 6.9 5.9*  ALBUMIN 3.6 3.6 3.1* 3.3* 3.0*    No results for input(s): "LIPASE", "AMYLASE" in the last 168 hours. No results for input(s): "AMMONIA" in the last 168 hours. Coagulation Profile: Recent Labs  Lab 12/14/21 2208  INR 1.1    Cardiac Enzymes: No results for input(s): "CKTOTAL", "CKMB", "CKMBINDEX", "TROPONINI" in the last 168 hours. BNP (last 3 results) No results for input(s): "PROBNP" in the last 8760 hours. HbA1C: Recent Labs    12/17/21 0500  HGBA1C 7.2*    CBG: Recent Labs  Lab 12/18/21 2049 12/18/21 2334 12/19/21 0346 12/19/21 0752 12/19/21 1104  GLUCAP 164* 117* 97 104* 165*    Lipid Profile: No results for input(s): "CHOL", "HDL", "LDLCALC", "TRIG", "CHOLHDL", "LDLDIRECT" in the last 72 hours. Thyroid Function Tests: No results for input(s): "TSH", "T4TOTAL", "FREET4", "T3FREE", "THYROIDAB" in the last 72 hours. Anemia Panel: No results for input(s): "VITAMINB12", "FOLATE", "FERRITIN", "TIBC", "IRON", "RETICCTPCT" in the last 72 hours. Sepsis Labs: Recent Labs  Lab 12/16/21 2102 12/16/21 2235 12/18/21 0735 12/18/21 1022  LATICACIDVEN 2.8* 2.8* 0.8 1.6     Recent Results (from the past 240 hour(s))  Culture, blood (Routine x 2)     Status: None (Preliminary result)   Collection Time: 12/14/21 10:08 PM   Specimen: BLOOD  Result Value Ref Range Status   Specimen Description   Final    BLOOD LEFT ANTECUBITAL Performed at [facility], [place] [address] [place],  [address]    Special Requests   Final    BOTTLES DRAWN AEROBIC ONLY Blood Culture results may not be optimal due to an inadequate volume of blood received in culture bottles  Performed at [facility], [place] [address] [place] [address], [place], [place] [address]    Culture   Final    NO GROWTH 3 DAYS Performed at  [facility], [place] [address] [place], [place] [address]    Report Status PENDING  Incomplete  Culture, blood (Routine x 2)     Status: Abnormal (Preliminary result)   Collection Time: 12/14/21 10:30 PM   Specimen: BLOOD  Result Value Ref Range Status   Specimen Description   Final    BLOOD RIGHT ANTECUBITAL Performed at [place] [address] [place], [place] [address]    Special Requests   Final    BOTTLES DRAWN AEROBIC AND ANAEROBIC Blood Culture adequate volume Performed at [place] [address] [place], [place] [address]    Culture  Setup Time   Final    GRAM NEGATIVE RODS AEROBIC BOTTLE ONLY CRITICAL RESULT CALLED TO, READ BACK BY AND VERIFIED WITH: RN A. NOAH [phone] @1652  FH GRAM POSITIVE RODS ANAEROBIC BOTTLE ONLY CRITICAL RESULT CALLED TO, READ BACK BY AND VERIFIED WITH: [place] [id number] AT 4 BY EC Performed at [facility], [place] [address], [place], [place] [address]    Culture ESCHERICHIA COLI (A)  Final   Report Status PENDING  Incomplete   Organism ID, Bacteria ESCHERICHIA COLI  Final      Susceptibility   Escherichia coli - MIC*    AMPICILLIN <=2 SENSITIVE Sensitive     CEFAZOLIN <=4 SENSITIVE Sensitive     CEFEPIME <=0.12 SENSITIVE Sensitive     CEFTAZIDIME <=1 SENSITIVE Sensitive     CEFTRIAXONE <=0.25 SENSITIVE Sensitive     CIPROFLOXACIN <=0.25 SENSITIVE Sensitive     GENTAMICIN <=1 SENSITIVE Sensitive     IMIPENEM <=0.25 SENSITIVE Sensitive     TRIMETH/SULFA <=20 SENSITIVE Sensitive     AMPICILLIN/SULBACTAM <=2 SENSITIVE Sensitive     PIP/TAZO <=4 SENSITIVE Sensitive     * ESCHERICHIA COLI  Blood Culture ID Panel (Reflexed)     Status: Abnormal   Collection Time: 12/14/21 10:30 PM  Result Value Ref Range Status   Enterococcus faecalis NOT DETECTED NOT DETECTED Final   Enterococcus Faecium NOT DETECTED NOT DETECTED Final   Listeria monocytogenes NOT DETECTED NOT DETECTED Final    Staphylococcus species NOT DETECTED NOT DETECTED Final   Staphylococcus aureus (BCID) NOT DETECTED NOT DETECTED Final   Staphylococcus epidermidis NOT DETECTED NOT DETECTED Final   Staphylococcus lugdunensis NOT DETECTED NOT DETECTED Final   Streptococcus species NOT DETECTED NOT DETECTED Final   Streptococcus agalactiae NOT DETECTED NOT DETECTED Final   Streptococcus pneumoniae NOT DETECTED NOT DETECTED Final   Streptococcus pyogenes NOT DETECTED NOT DETECTED Final   A.calcoaceticus-baumannii NOT DETECTED NOT DETECTED Final   Bacteroides fragilis NOT DETECTED NOT DETECTED Final   Enterobacterales DETECTED (A) NOT DETECTED Final    Comment: Enterobacterales represent a large order of gram negative bacteria, not a single organism. CRITICAL RESULT CALLED TO, READ BACK BY AND VERIFIED WITH: RN A. NOAH [phone] @1652  FH    Enterobacter cloacae complex NOT DETECTED NOT DETECTED Final   Escherichia coli DETECTED (A) NOT DETECTED Final    Comment: CRITICAL RESULT CALLED TO, READ BACK BY AND VERIFIED WITH: RN A. NOAH [phone] @1652  FH    Klebsiella aerogenes NOT DETECTED NOT DETECTED Final   Klebsiella oxytoca NOT DETECTED NOT DETECTED Final   Klebsiella pneumoniae NOT DETECTED NOT DETECTED Final   Proteus species NOT DETECTED  NOT DETECTED Final   Salmonella species NOT DETECTED NOT DETECTED Final   Serratia marcescens NOT DETECTED NOT DETECTED Final   Haemophilus influenzae NOT DETECTED NOT DETECTED Final   Neisseria meningitidis NOT DETECTED NOT DETECTED Final   Pseudomonas aeruginosa NOT DETECTED NOT DETECTED Final   Stenotrophomonas maltophilia NOT DETECTED NOT DETECTED Final   Candida albicans NOT DETECTED NOT DETECTED Final   Candida auris NOT DETECTED NOT DETECTED Final   Candida glabrata NOT DETECTED NOT DETECTED Final   Candida krusei NOT DETECTED NOT DETECTED Final   Candida parapsilosis NOT DETECTED NOT DETECTED Final   Candida tropicalis NOT DETECTED NOT DETECTED Final    Cryptococcus neoformans/gattii NOT DETECTED NOT DETECTED Final   CTX-M ESBL NOT DETECTED NOT DETECTED Final   Carbapenem resistance IMP NOT DETECTED NOT DETECTED Final   Carbapenem resistance KPC NOT DETECTED NOT DETECTED Final   Carbapenem resistance NDM NOT DETECTED NOT DETECTED Final   Carbapenem resist OXA 48 LIKE NOT DETECTED NOT DETECTED Final   Carbapenem resistance VIM NOT DETECTED NOT DETECTED Final    Comment: Performed at [facility], [address] [place], [place] [address]  Urine Culture     Status: None   Collection Time: 12/14/21 11:25 PM   Specimen: Urine, Clean Catch  Result Value Ref Range Status   Specimen Description   Final    URINE, CLEAN CATCH Performed at [facility], [place] [address], [place], [place] [address]    Special Requests   Final    NONE Performed at [facility], [place] [address], [place], [place] [address]    Culture   Final    NO GROWTH Performed at [facility], [place] [address], [place], [place] [address]    Report Status 12/16/2021 FINAL  Final  Blood culture (routine x 2)     Status: None (Preliminary result)   Collection Time: 12/16/21  9:04 PM   Specimen: BLOOD RIGHT HAND  Result Value Ref Range Status   Specimen Description   Final    BLOOD RIGHT HAND Performed at [place] [address] [place], [place] [address]    Special Requests   Final    BOTTLES DRAWN AEROBIC AND ANAEROBIC Blood Culture adequate volume Performed at [place] [address], [place], [place] [address]    Culture   Final    NO GROWTH 2 DAYS Performed at [place] [address], [place], [place] [address]    Report Status PENDING  Incomplete  Blood culture (routine x 2)     Status: None (Preliminary result)   Collection Time: 12/16/21  9:06 PM   Specimen: BLOOD LEFT FOREARM  Result Value Ref Range Status   Specimen Description   Final    BLOOD LEFT  FOREARM Performed at [place] [address], [place], [place] [address]    Special Requests   Final    BOTTLES DRAWN AEROBIC AND ANAEROBIC Blood Culture adequate volume Performed at [place] [address], [place], [place] [address]    Culture   Final    NO GROWTH 2 DAYS Performed at [place] [address] [place], [place] [address]    Report Status PENDING  Incomplete  Urine Culture     Status: None   Collection Time: 12/16/21  9:10 PM   Specimen: Urine, Clean Catch  Result Value Ref Range Status  Specimen Description   Final    URINE, CLEAN CATCH Performed at [facility], [place] [address], [place], [place] [address]    Special Requests   Final    NONE Performed at [facility], [place] [address] [place], [place] [address]    Culture   Final    NO GROWTH Performed at [facility], [place] [address] [place], [place] [address]    Report Status 12/18/2021 FINAL  Final         Radiology Studies: No results found.      Scheduled Meds:  enoxaparin (LOVENOX) injection  30 mg Subcutaneous Q24H   insulin aspart  0-9 Units Subcutaneous Q4H   tamsulosin  0.4 mg Oral Daily   Continuous Infusions:  cefTRIAXone (ROCEPHIN)  IV 2 g (12/18/21 2310)     LOS: 3 days    Time spent:40 min    Yunuen Mordan, Geraldo Docker, MD [facility]   If 7PM-7AM, please contact night-coverage 12/19/2021, 11:07 AM

## 2021-12-19 NOTE — Plan of Care (Signed)

## 2021-12-19 NOTE — Care Management Important Message (Signed)
Important Message  Patient Details IM Letter placed in Patients room. Name: Aaron Landry MRN: [id number] Date of Birth: Jan 08, 1925   Medicare Important Message Given:  Yes     Caren Macadam 12/19/2021, 10:53 AM

## 2021-12-20 DIAGNOSIS — R338 Other retention of urine: Secondary | ICD-10-CM | POA: Diagnosis present

## 2021-12-20 DIAGNOSIS — N189 Chronic kidney disease, unspecified: Secondary | ICD-10-CM

## 2021-12-20 DIAGNOSIS — N1832 Chronic kidney disease, stage 3b: Secondary | ICD-10-CM

## 2021-12-20 DIAGNOSIS — N179 Acute kidney failure, unspecified: Secondary | ICD-10-CM

## 2021-12-20 DIAGNOSIS — R531 Weakness: Secondary | ICD-10-CM

## 2021-12-20 DIAGNOSIS — E1165 Type 2 diabetes mellitus with hyperglycemia: Secondary | ICD-10-CM

## 2021-12-20 DIAGNOSIS — Z862 Personal history of diseases of the blood and blood-forming organs and certain disorders involving the immune mechanism: Secondary | ICD-10-CM

## 2021-12-20 DIAGNOSIS — R7881 Bacteremia: Secondary | ICD-10-CM | POA: Diagnosis not present

## 2021-12-20 LAB — CBC WITH DIFFERENTIAL/PLATELET
Abs Immature Granulocytes: 0.03 10*3/uL (ref 0.00–0.07)
Basophils Absolute: 0.1 10*3/uL (ref 0.0–0.1)
Basophils Relative: 1 %
Eosinophils Absolute: 0.6 10*3/uL — ABNORMAL HIGH (ref 0.0–0.5)
Eosinophils Relative: 9 %
HCT: 30.1 % — ABNORMAL LOW (ref 39.0–52.0)
Hemoglobin: 10.4 g/dL — ABNORMAL LOW (ref 13.0–17.0)
Immature Granulocytes: 1 %
Lymphocytes Relative: 29 %
Lymphs Abs: 1.8 10*3/uL (ref 0.7–4.0)
MCH: 31.5 pg (ref 26.0–34.0)
MCHC: 34.6 g/dL (ref 30.0–36.0)
MCV: 91.2 fL (ref 80.0–100.0)
Monocytes Absolute: 0.5 10*3/uL (ref 0.1–1.0)
Monocytes Relative: 8 %
Neutro Abs: 3.4 10*3/uL (ref 1.7–7.7)
Neutrophils Relative %: 52 %
Platelets: 215 10*3/uL (ref 150–400)
RBC: 3.3 MIL/uL — ABNORMAL LOW (ref 4.22–5.81)
RDW: 13.1 % (ref 11.5–15.5)
WBC: 6.4 10*3/uL (ref 4.0–10.5)
nRBC: 0 % (ref 0.0–0.2)

## 2021-12-20 LAB — GLUCOSE, CAPILLARY
Glucose-Capillary: 117 mg/dL — ABNORMAL HIGH (ref 70–99)
Glucose-Capillary: 106 mg/dL — ABNORMAL HIGH (ref 70–99)
Glucose-Capillary: 223 mg/dL — ABNORMAL HIGH (ref 70–99)
Glucose-Capillary: 183 mg/dL — ABNORMAL HIGH (ref 70–99)
Glucose-Capillary: 171 mg/dL — ABNORMAL HIGH (ref 70–99)

## 2021-12-20 LAB — COMPREHENSIVE METABOLIC PANEL
ALT: 27 U/L (ref 0–44)
AST: 33 U/L (ref 15–41)
Albumin: 3 g/dL — ABNORMAL LOW (ref 3.5–5.0)
Alkaline Phosphatase: 76 U/L (ref 38–126)
Anion gap: 9 (ref 5–15)
BUN: 25 mg/dL — ABNORMAL HIGH (ref 8–23)
CO2: 22 mmol/L (ref 22–32)
Calcium: 8.1 mg/dL — ABNORMAL LOW (ref 8.9–10.3)
Chloride: 102 mmol/L (ref 98–111)
Creatinine, Ser: 1.74 mg/dL — ABNORMAL HIGH (ref 0.61–1.24)
GFR, Estimated: 36 mL/min — ABNORMAL LOW (ref >60)
Glucose, Bld: 100 mg/dL — ABNORMAL HIGH (ref 70–99)
Potassium: 3.5 mmol/L (ref 3.5–5.1)
Sodium: 133 mmol/L — ABNORMAL LOW (ref 135–145)
Total Bilirubin: 0.7 mg/dL (ref 0.3–1.2)
Total Protein: 5.8 g/dL — ABNORMAL LOW (ref 6.5–8.1)

## 2021-12-20 LAB — CULTURE, BLOOD (ROUTINE X 2): Culture: NO GROWTH

## 2021-12-20 LAB — PHOSPHORUS: Phosphorus: 3.1 mg/dL (ref 2.5–4.6)

## 2021-12-20 LAB — MAGNESIUM: Magnesium: 2.1 mg/dL (ref 1.7–2.4)

## 2021-12-20 MED ORDER — CEFDINIR 300 MG PO CAPS
300.0000 mg | ORAL_CAPSULE | Freq: Two times a day (BID) | ORAL | Status: DC
  Administered 2021-12-20 – 2021-12-22 (×4): 300 mg via ORAL
  Filled 2021-12-20 (×4): qty 1

## 2021-12-20 NOTE — Progress Notes (Signed)
PROGRESS NOTE    Aaron Landry  ZOX:[id number] DOB: 1925-05-04 DOA: 12/16/2021 PCP: Geoffry Paradise, MD     Brief Narrative:  88 yo WM PMHx recurrent UTIs, BPH, recent visit to the ED 7/1   Presented to [facility] from home due to positive blood cultures, E. coli bacteremia from blood cultures drawn on 12/14/2021 during ED visit. He visited the ED on 7/1 complaining of nausea, vomiting and inability to urinate.   Subjective: 7/7 afebrile overnight, A/O x4.  Patient was able to ambulate in the hallway with PT.    Assessment & Plan: Covid vaccination;   Principal Problem:   Bacteremia due to Escherichia coli Active Problems:   Acute renal failure superimposed on stage 3b chronic kidney disease (HCC)   E coli bacteremia   Acute retention of urine   History of anemia due to chronic kidney disease   Uncontrolled type 2 diabetes mellitus with hyperglycemia, without long-term current use of insulin (HCC)   Generalized weakness   Hypocalcemia   Positive E. coli Bacteremia -Most likely from untreated prostatitis -Urine culture 7/1 no growth to date.  -CT abdomen pelvis 7/02: Colonic diverticulosis, markedly enlarged and heterogeneous prostate gland.  Stable area of rounded atelectasis and associated small pleural effusion within the right lung base. -Complete 10-day course antibiotics given patient's prostate.  -7/7 change Ceftriaxone---> Cefdinir to complete 10-day course antibiotic.     Acute on CKD STAGE IIIB; (baseline Cr 1.8) Lab Results  Component Value Date   CREATININE 1.74 (H) 12/20/2021   CREATININE 1.69 (H) 12/19/2021   CREATININE 1.83 (H) 12/18/2021   CREATININE 1.78 (H) 12/17/2021   CREATININE 1.84 (H) 12/16/2021  -At baseline   Marked enlarge prostate Gland on CT/Acute urine Retention -Flomax 0.4 mg daily - Strict in and out +463.75ml - Daily weight  Filed Weights   12/16/21 1837 12/19/21 0348 12/20/21 0500  Weight: 63.5 kg 62.5 kg 61.3 kg  -7/7 PSA  pending -Establish care urology upon discharge   lactic acidosis:  -In setting of infection.  Continue with IV fluids and IV antibiotics.    Anemia of chronic kidney disease: Lab Results  Component Value Date   HGB 10.4 (L) 12/20/2021   HGB 10.5 (L) 12/19/2021   HGB 12.0 (L) 12/18/2021   HGB 11.7 (L) 12/17/2021   HGB 11.3 (L) 12/16/2021    DM type II uncontrolled with hyperglycemia -7/4 hemoglobin A1c = 7.2 - 7/5 sensitive SSI   Generalized  weakness: -In setting of infection.  -PT OT evaluation.    Hypomagnesemia -Magnesium goal> 2 -7/6 Magnesium IV 3 g  Hypocalcemia - Calcium goal> 8.9 - 7/6 Corrected calcium= 8.8 - 7/6 calcium gluconate 2 g  Goals of care - PT/OT: Recommend home health, PT       Mobility Assessment (last 72 hours)     Mobility Assessment     Row Name 12/19/21 8119 12/18/21 1954 12/18/21 1202 12/18/21 1030 12/17/21 1936   Does patient have an order for bedrest or is patient medically unstable No - Continue assessment No - Continue assessment -- -- No - Continue assessment   What is the highest level of mobility based on the progressive mobility assessment? Level 5 (Walks with assist in room/hall) - Balance while stepping forward/back and can walk in room with assist - Complete Level 5 (Walks with assist in room/hall) - Balance while stepping forward/back and can walk in room with assist - Complete Level 5 (Walks with assist in room/hall) - Balance while  stepping forward/back and can walk in room with assist - Complete Level 5 (Walks with assist in room/hall) - Balance while stepping forward/back and can walk in room with assist - Complete Level 5 (Walks with assist in room/hall) - Balance while stepping forward/back and can walk in room with assist - Complete   Is the above level different from baseline mobility prior to current illness? Yes - Recommend PT order Yes - Recommend PT order -- -- --    Row Name 12/17/21 1726 12/17/21 1700          Does patient have an order for bedrest or is patient medically unstable No - Continue assessment No - Continue assessment      What is the highest level of mobility based on the progressive mobility assessment? Level 4 (Walks with assist in room) - Balance while marching in place and cannot step forward and back - Complete Level 4 (Walks with assist in room) - Balance while marching in place and cannot step forward and back - Complete                   DVT prophylaxis: Lovenox Code Status: Full Family Communication: 7/5 son at bedside for discussion of plan of care all questions answered Status is: Inpatient    Dispo: The patient is from: Home              Anticipated d/c is to: SNF              Anticipated d/c date is: 2 days              Patient currently is not medically stable to d/c.      Consultants:    Procedures/Significant Events:    I have personally reviewed and interpreted all radiology studies and my findings are as above.  VENTILATOR SETTINGS:    Cultures 7/1 blood RIGHT AC positive E. Coli 7/3 blood RIGHT hand NGTD 7/3 blood LEFT forearm NGTD 7/1 urine negative final   Antimicrobials: Anti-infectives (From admission, onward)    Start     Ordered Stop   12/20/21 1600  cefdinir (OMNICEF) capsule 300 mg        12/20/21 1417 12/27/21 2159   12/17/21 2100  cefTRIAXone (ROCEPHIN) 2 g in sodium chloride 0.9 % 100 mL IVPB  Status:  Discontinued        12/16/21 2231 12/20/21 1417   12/16/21 2100  cefTRIAXone (ROCEPHIN) 1 g in sodium chloride 0.9 % 100 mL IVPB  Status:  Discontinued        12/16/21 2047 12/16/21 2053   12/16/21 2100  cefTRIAXone (ROCEPHIN) 2 g in sodium chloride 0.9 % 100 mL IVPB        12/16/21 2053 12/16/21 2152           Devices    LINES / TUBES:      Continuous Infusions:  cefTRIAXone (ROCEPHIN)  IV 2 g (12/19/21 2050)     Objective: Vitals:   12/19/21 1342 12/19/21 2038 12/20/21 0500 12/20/21 0525  BP: 134/67  (!) 165/79  111/61  Pulse: 68 70  74  Resp: 16 16  16   Temp: 98.3 F (36.8 C) 98.8 F (37.1 C)  97.9 F (36.6 C)  TempSrc: Oral Oral    SpO2: 96% 100%  98%  Weight:   61.3 kg   Height:        Intake/Output Summary (Last 24 hours) at 12/20/2021 0851 Last data filed at 12/20/2021 [phone]  Gross per 24 hour  Intake 952.87 ml  Output 950 ml  Net 2.87 ml   Filed Weights   12/16/21 1837 12/19/21 0348 12/20/21 0500  Weight: 63.5 kg 62.5 kg 61.3 kg    Examination:  General: A/O x4, No acute respiratory distress, cachectic Eyes: negative scleral hemorrhage, negative anisocoria, negative icterus ENT: Negative Runny nose, negative gingival bleeding, Neck:  Negative scars, masses, torticollis, lymphadenopathy, JVD Lungs: Clear to auscultation bilaterally without wheezes or crackles Cardiovascular: Regular rate and rhythm without murmur gallop or rub normal S1 and S2 Abdomen: negative abdominal pain, nondistended, positive soft, bowel sounds, no rebound, no ascites, no appreciable mass Extremities: No significant cyanosis, clubbing, or edema bilateral lower extremities Skin: Negative rashes, lesions, ulcers Psychiatric:  Negative depression, negative anxiety, negative fatigue, negative mania  Central nervous system:  Cranial nerves II through XII intact, tongue/uvula midline, all extremities muscle strength 5/5, sensation intact throughout, negative dysarthria, negative expressive aphasia, negative receptive aphasia.  .     Data Reviewed: Care during the described time interval was provided by me .  I have reviewed this patient's available data, including medical history, events of note, physical examination, and all test results as part of my evaluation.  CBC: Recent Labs  Lab 12/16/21 2102 12/17/21 0500 12/18/21 1255 12/19/21 0554 12/20/21 0708  WBC 8.3 8.1 5.4 6.1 6.4  NEUTROABS 5.4 5.3 3.5 2.8 3.4  HGB 11.3* 11.7* 12.0* 10.5* 10.4*  HCT 34.5* 34.1* 34.6* 30.5* 30.1*  MCV 94.0  91.9 91.3 90.8 91.2  PLT 201 207 170 207 [id number]   Basic Metabolic Panel: Recent Labs  Lab 12/14/21 2208 12/16/21 2102 12/17/21 0500 12/18/21 1255 12/19/21 0554  NA 138 137 137 138 138  K 3.6 4.0 3.7 3.6 3.7  CL 108 107 105 107 106  CO2 17* 22 22 23 24   GLUCOSE 174* 141* 129* 200* 100*  BUN 34* 45* 36* 30* 28*  CREATININE 1.60* 1.84* 1.78* 1.83* 1.69*  CALCIUM 8.8* 8.3* 8.2* 8.5* 8.0*  MG  --   --  1.5* 1.7 1.5*  PHOS  --   --  2.6  --  3.7   GFR: Estimated Creatinine Clearance: 23.2 mL/min (A) (by C-G formula based on SCr of 1.69 mg/dL (H)). Liver Function Tests: Recent Labs  Lab 12/14/21 2208 12/16/21 2102 12/17/21 0500 12/18/21 1255 12/19/21 0554  AST 25 31 35 26 31  ALT 17 21 23 22 22   ALKPHOS 88 77 70 83 74  BILITOT 1.2 0.5 1.0 0.5 0.5  PROT 7.0 6.9 6.6 6.9 5.9*  ALBUMIN 3.6 3.6 3.1* 3.3* 3.0*   No results for input(s): "LIPASE", "AMYLASE" in the last 168 hours. No results for input(s): "AMMONIA" in the last 168 hours. Coagulation Profile: Recent Labs  Lab 12/14/21 2208  INR 1.1   Cardiac Enzymes: No results for input(s): "CKTOTAL", "CKMB", "CKMBINDEX", "TROPONINI" in the last 168 hours. BNP (last 3 results) No results for input(s): "PROBNP" in the last 8760 hours. HbA1C: No results for input(s): "HGBA1C" in the last 72 hours.  CBG: Recent Labs  Lab 12/19/21 1702 12/19/21 2040 12/19/21 2357 12/20/21 0436 12/20/21 0725  GLUCAP 223* 194* 95 117* 106*   Lipid Profile: No results for input(s): "CHOL", "HDL", "LDLCALC", "TRIG", "CHOLHDL", "LDLDIRECT" in the last 72 hours. Thyroid Function Tests: No results for input(s): "TSH", "T4TOTAL", "FREET4", "T3FREE", "THYROIDAB" in the last 72 hours. Anemia Panel: No results for input(s): "VITAMINB12", "FOLATE", "FERRITIN", "TIBC", "IRON", "RETICCTPCT" in the last 72 hours. Sepsis Labs: Recent  Labs  Lab 12/16/21 2102 12/16/21 2235 12/18/21 0735 12/18/21 1022  LATICACIDVEN 2.8* 2.8* 0.8 1.6    Recent  Results (from the past 240 hour(s))  Culture, blood (Routine x 2)     Status: None (Preliminary result)   Collection Time: 12/14/21 10:08 PM   Specimen: BLOOD  Result Value Ref Range Status   Specimen Description   Final    BLOOD LEFT ANTECUBITAL Performed at [facility] [address], [place], [place] [address]    Special Requests   Final    BOTTLES DRAWN AEROBIC ONLY Blood Culture results may not be optimal due to an inadequate volume of blood received in culture bottles Performed at [facility] [address] [place], [place] [address]    Culture   Final    NO GROWTH 4 DAYS Performed at [facility], [place] [address], [place], [place] [address]    Report Status PENDING  Incomplete  Culture, blood (Routine x 2)     Status: Abnormal (Preliminary result)   Collection Time: 12/14/21 10:30 PM   Specimen: BLOOD  Result Value Ref Range Status   Specimen Description   Final    BLOOD RIGHT ANTECUBITAL Performed at [facility] [address] [place], [place] [address]    Special Requests   Final    BOTTLES DRAWN AEROBIC AND ANAEROBIC Blood Culture adequate volume Performed at [facility] [address] [place], [place] [address]    Culture  Setup Time   Final    GRAM NEGATIVE RODS AEROBIC BOTTLE ONLY CRITICAL RESULT CALLED TO, READ BACK BY AND VERIFIED WITH: RN A. NOAH [phone] @1652  FH GRAM POSITIVE RODS ANAEROBIC BOTTLE ONLY CRITICAL RESULT CALLED TO, READ BACK BY AND VERIFIED WITH: [place] TF:[id number] AT 54 BY EC Performed at [facility], [place] [address] [place], [place] [address]    Culture ESCHERICHIA COLI (A)  Final   Report Status PENDING  Incomplete   Organism ID, Bacteria ESCHERICHIA COLI  Final      Susceptibility   Escherichia coli - MIC*    AMPICILLIN <=2 SENSITIVE Sensitive     CEFAZOLIN <=4 SENSITIVE Sensitive     CEFEPIME <=0.12 SENSITIVE Sensitive      CEFTAZIDIME <=1 SENSITIVE Sensitive     CEFTRIAXONE <=0.25 SENSITIVE Sensitive     CIPROFLOXACIN <=0.25 SENSITIVE Sensitive     GENTAMICIN <=1 SENSITIVE Sensitive     IMIPENEM <=0.25 SENSITIVE Sensitive     TRIMETH/SULFA <=20 SENSITIVE Sensitive     AMPICILLIN/SULBACTAM <=2 SENSITIVE Sensitive     PIP/TAZO <=4 SENSITIVE Sensitive     * ESCHERICHIA COLI  Blood Culture ID Panel (Reflexed)     Status: Abnormal   Collection Time: 12/14/21 10:30 PM  Result Value Ref Range Status   Enterococcus faecalis NOT DETECTED NOT DETECTED Final   Enterococcus Faecium NOT DETECTED NOT DETECTED Final   Listeria monocytogenes NOT DETECTED NOT DETECTED Final   Staphylococcus species NOT DETECTED NOT DETECTED Final   Staphylococcus aureus (BCID) NOT DETECTED NOT DETECTED Final   Staphylococcus epidermidis NOT DETECTED NOT DETECTED Final   Staphylococcus lugdunensis NOT DETECTED NOT DETECTED Final   Streptococcus species NOT DETECTED NOT DETECTED Final   Streptococcus agalactiae NOT DETECTED NOT DETECTED Final   Streptococcus pneumoniae NOT DETECTED NOT DETECTED Final   Streptococcus pyogenes NOT DETECTED NOT DETECTED Final   A.calcoaceticus-baumannii NOT DETECTED NOT DETECTED Final   Bacteroides fragilis NOT DETECTED NOT  DETECTED Final   Enterobacterales DETECTED (A) NOT DETECTED Final    Comment: Enterobacterales represent a large order of gram negative bacteria, not a single organism. CRITICAL RESULT CALLED TO, READ BACK BY AND VERIFIED WITH: RN A. NOAH [phone] @1652  FH    Enterobacter cloacae complex NOT DETECTED NOT DETECTED Final   Escherichia coli DETECTED (A) NOT DETECTED Final    Comment: CRITICAL RESULT CALLED TO, READ BACK BY AND VERIFIED WITH: RN A. NOAH [phone] @1652  FH    Klebsiella aerogenes NOT DETECTED NOT DETECTED Final   Klebsiella oxytoca NOT DETECTED NOT DETECTED Final   Klebsiella pneumoniae NOT DETECTED NOT DETECTED Final   Proteus species NOT DETECTED NOT DETECTED Final    Salmonella species NOT DETECTED NOT DETECTED Final   Serratia marcescens NOT DETECTED NOT DETECTED Final   Haemophilus influenzae NOT DETECTED NOT DETECTED Final   Neisseria meningitidis NOT DETECTED NOT DETECTED Final   Pseudomonas aeruginosa NOT DETECTED NOT DETECTED Final   Stenotrophomonas maltophilia NOT DETECTED NOT DETECTED Final   Candida albicans NOT DETECTED NOT DETECTED Final   Candida auris NOT DETECTED NOT DETECTED Final   Candida glabrata NOT DETECTED NOT DETECTED Final   Candida krusei NOT DETECTED NOT DETECTED Final   Candida parapsilosis NOT DETECTED NOT DETECTED Final   Candida tropicalis NOT DETECTED NOT DETECTED Final   Cryptococcus neoformans/gattii NOT DETECTED NOT DETECTED Final   CTX-M ESBL NOT DETECTED NOT DETECTED Final   Carbapenem resistance IMP NOT DETECTED NOT DETECTED Final   Carbapenem resistance KPC NOT DETECTED NOT DETECTED Final   Carbapenem resistance NDM NOT DETECTED NOT DETECTED Final   Carbapenem resist OXA 48 LIKE NOT DETECTED NOT DETECTED Final   Carbapenem resistance VIM NOT DETECTED NOT DETECTED Final    Comment: Performed at [facility], [address], [place], [place] [address]  Urine Culture     Status: None   Collection Time: 12/14/21 11:25 PM   Specimen: Urine, Clean Catch  Result Value Ref Range Status   Specimen Description   Final    URINE, CLEAN CATCH Performed at [facility], [place] [address], [place], [place] [address]    Special Requests   Final    NONE Performed at [facility], [place] [address] [place], [place] [address]    Culture   Final    NO GROWTH Performed at [facility], [place] [address], [place], [place] [address]    Report Status 12/16/2021 FINAL  Final  Blood culture (routine x 2)     Status: None (Preliminary result)   Collection Time: 12/16/21  9:04 PM   Specimen: BLOOD RIGHT HAND  Result Value Ref Range Status   Specimen Description   Final    BLOOD RIGHT  HAND Performed at [facility] [address] [place], [place] [address]    Special Requests   Final    BOTTLES DRAWN AEROBIC AND ANAEROBIC Blood Culture adequate volume Performed at [facility] [address], [place], [place] [address]    Culture   Final    NO GROWTH 3 DAYS Performed at [facility], [place] [address], [place],  [address]    Report Status PENDING  Incomplete  Blood culture (routine x 2)     Status: None (Preliminary result)   Collection Time: 12/16/21  9:06 PM   Specimen: BLOOD LEFT FOREARM  Result Value Ref Range Status   Specimen Description   Final  BLOOD LEFT FOREARM Performed at [facility] [address], [place], [place] [address]    Special Requests   Final    BOTTLES DRAWN AEROBIC AND ANAEROBIC Blood Culture adequate volume Performed at [facility] [address] [place], [place] [address]    Culture   Final    NO GROWTH 3 DAYS Performed at [facility], [place] [address], [place], [place] [address]    Report Status PENDING  Incomplete  Urine Culture     Status: None   Collection Time: 12/16/21  9:10 PM   Specimen: Urine, Clean Catch  Result Value Ref Range Status   Specimen Description   Final    URINE, CLEAN CATCH Performed at [facility], [place] [address] [place], [place] [address]    Special Requests   Final    NONE Performed at [facility], [place] [address], [place], [place] [address]    Culture   Final    NO GROWTH Performed at [facility], [place] [address], [place], [place] [address]    Report Status 12/18/2021 FINAL  Final         Radiology Studies: No results found.      Scheduled Meds:  enoxaparin (LOVENOX) injection  30 mg Subcutaneous Q24H   insulin aspart  0-9 Units Subcutaneous Q4H   tamsulosin  0.4 mg Oral Daily   Continuous Infusions:  cefTRIAXone (ROCEPHIN)  IV 2 g  (12/19/21 2050)     LOS: 4 days    Time spent:40 min    Karlita Lichtman, Geraldo Docker, MD [facility]   If 7PM-7AM, please contact night-coverage 12/20/2021, 8:51 AM

## 2021-12-20 NOTE — Progress Notes (Signed)
Physical Therapy Treatment Patient Details Name: Aaron Landry MRN: [id number] DOB: 1924/12/08 Today's Date: 12/20/2021   History of Present Illness 88 yo male admitted with bacteremia. Hx of CKD, anemia, DM    PT Comments    Patient eager to ambulate, used RW, x 100'. Patient  will benefit from frequent caregiver support at DC. Max HR 102  Recommendations for follow up therapy are one component of a multi-disciplinary discharge planning process, led by the attending physician.  Recommendations may be updated based on patient status, additional functional criteria and insurance authorization.  Follow Up Recommendations  Home health PT     Assistance Recommended at Discharge Frequent or constant Supervision/Assistance  Patient can return home with the following A little help with walking and/or transfers;A little help with bathing/dressing/bathroom;Assist for transportation;Assistance with cooking/housework;Help with stairs or ramp for entrance   Equipment Recommendations  Rolling walker (2 wheels)    Recommendations for Other Services       Precautions / Restrictions Precautions Precautions: Fall Precaution Comments: reports no hx of falls     Mobility  Bed Mobility Overal bed mobility: Modified Independent                  Transfers Overall transfer level: Needs assistance Equipment used: Rolling walker (2 wheels) Transfers: Sit to/from Stand Sit to Stand: Supervision                Ambulation/Gait Ambulation/Gait assistance: Min guard Gait Distance (Feet): 100 Feet Assistive device: Rolling walker (2 wheels) Gait Pattern/deviations: Step-through pattern, Decreased stride length, Shuffle Gait velocity: decr     General Gait Details: min guard for safety. cues for safety, RW proximity, proper use of RW, and for pt to try to increase step lengths, at times appaeared had  slight knee buckle?L or R.   Stairs             Wheelchair Mobility     Modified Rankin (Stroke Patients Only)       Balance           Standing balance support: Bilateral upper extremity supported, Reliant on assistive device for balance, During functional activity Standing balance-Leahy Scale: Fair                              Cognition Arousal/Alertness: Awake/alert Behavior During Therapy: WFL for tasks assessed/performed Overall Cognitive Status: History of cognitive impairments - at baseline                                 General Comments: pt reports memory issues at baseline        Exercises      General Comments        Pertinent Vitals/Pain Pain Assessment Pain Assessment: No/denies pain    Home Living                          Prior Function            PT Goals (current goals can now be found in the care plan section) Progress towards PT goals: Progressing toward goals    Frequency    Min 3X/week      PT Plan Current plan remains appropriate    Co-evaluation              AM-PAC PT "6 Clicks" Mobility  Outcome Measure  Help needed turning from your back to your side while in a flat bed without using bedrails?: None Help needed moving from lying on your back to sitting on the side of a flat bed without using bedrails?: None Help needed moving to and from a bed to a chair (including a wheelchair)?: A Little Help needed standing up from a chair using your arms (e.g., wheelchair or bedside chair)?: A Little Help needed to walk in hospital room?: A Little Help needed climbing 3-5 steps with a railing? : A Little 6 Click Score: 20    End of Session Equipment Utilized During Treatment: Gait belt Activity Tolerance: Patient tolerated treatment well Patient left: in chair;with call bell/phone within reach Nurse Communication: Mobility status PT Visit Diagnosis: Muscle weakness (generalized) (M62.81);Difficulty in walking, not elsewhere classified (R26.2);Unsteadiness on  feet (R26.81)     Time: 3762-8315 PT Time Calculation (min) (ACUTE ONLY): 19 min  Charges:  $Gait Training: 8-22 mins                     Blanchard Kelch PT Acute Rehabilitation Services Office [phone] Weekend pager-[phone]    Rada Hay 12/20/2021, 3:42 PM

## 2021-12-21 DIAGNOSIS — R338 Other retention of urine: Secondary | ICD-10-CM | POA: Diagnosis not present

## 2021-12-21 DIAGNOSIS — R531 Weakness: Secondary | ICD-10-CM | POA: Diagnosis not present

## 2021-12-21 DIAGNOSIS — R7881 Bacteremia: Secondary | ICD-10-CM | POA: Diagnosis not present

## 2021-12-21 DIAGNOSIS — N179 Acute kidney failure, unspecified: Secondary | ICD-10-CM | POA: Diagnosis not present

## 2021-12-21 LAB — CBC WITH DIFFERENTIAL/PLATELET
Abs Immature Granulocytes: 0.05 10*3/uL (ref 0.00–0.07)
Basophils Absolute: 0.1 10*3/uL (ref 0.0–0.1)
Basophils Relative: 1 %
Eosinophils Absolute: 0.8 10*3/uL — ABNORMAL HIGH (ref 0.0–0.5)
Eosinophils Relative: 10 %
HCT: 29.5 % — ABNORMAL LOW (ref 39.0–52.0)
Hemoglobin: 9.9 g/dL — ABNORMAL LOW (ref 13.0–17.0)
Immature Granulocytes: 1 %
Lymphocytes Relative: 25 %
Lymphs Abs: 1.8 10*3/uL (ref 0.7–4.0)
MCH: 30.8 pg (ref 26.0–34.0)
MCHC: 33.6 g/dL (ref 30.0–36.0)
MCV: 91.9 fL (ref 80.0–100.0)
Monocytes Absolute: 0.7 10*3/uL (ref 0.1–1.0)
Monocytes Relative: 9 %
Neutro Abs: 4 10*3/uL (ref 1.7–7.7)
Neutrophils Relative %: 54 %
Platelets: 215 10*3/uL (ref 150–400)
RBC: 3.21 MIL/uL — ABNORMAL LOW (ref 4.22–5.81)
RDW: 13.2 % (ref 11.5–15.5)
WBC: 7.4 10*3/uL (ref 4.0–10.5)
nRBC: 0 % (ref 0.0–0.2)

## 2021-12-21 LAB — GLUCOSE, CAPILLARY
Glucose-Capillary: 100 mg/dL — ABNORMAL HIGH (ref 70–99)
Glucose-Capillary: 144 mg/dL — ABNORMAL HIGH (ref 70–99)
Glucose-Capillary: 158 mg/dL — ABNORMAL HIGH (ref 70–99)
Glucose-Capillary: 179 mg/dL — ABNORMAL HIGH (ref 70–99)
Glucose-Capillary: 183 mg/dL — ABNORMAL HIGH (ref 70–99)
Glucose-Capillary: 194 mg/dL — ABNORMAL HIGH (ref 70–99)

## 2021-12-21 LAB — CULTURE, BLOOD (ROUTINE X 2)
Culture: NO GROWTH
Culture: NO GROWTH
Special Requests: ADEQUATE
Special Requests: ADEQUATE

## 2021-12-21 LAB — COMPREHENSIVE METABOLIC PANEL
ALT: 31 U/L (ref 0–44)
AST: 33 U/L (ref 15–41)
Albumin: 2.8 g/dL — ABNORMAL LOW (ref 3.5–5.0)
Alkaline Phosphatase: 79 U/L (ref 38–126)
Anion gap: 6 (ref 5–15)
BUN: 33 mg/dL — ABNORMAL HIGH (ref 8–23)
CO2: 26 mmol/L (ref 22–32)
Calcium: 8.4 mg/dL — ABNORMAL LOW (ref 8.9–10.3)
Chloride: 106 mmol/L (ref 98–111)
Creatinine, Ser: 1.89 mg/dL — ABNORMAL HIGH (ref 0.61–1.24)
GFR, Estimated: 32 mL/min — ABNORMAL LOW (ref >60)
Glucose, Bld: 104 mg/dL — ABNORMAL HIGH (ref 70–99)
Potassium: 4.1 mmol/L (ref 3.5–5.1)
Sodium: 138 mmol/L (ref 135–145)
Total Bilirubin: 0.6 mg/dL (ref 0.3–1.2)
Total Protein: 5.7 g/dL — ABNORMAL LOW (ref 6.5–8.1)

## 2021-12-21 LAB — MAGNESIUM: Magnesium: 2 mg/dL (ref 1.7–2.4)

## 2021-12-21 LAB — PHOSPHORUS: Phosphorus: 3.7 mg/dL (ref 2.5–4.6)

## 2021-12-21 LAB — PSA: Prostatic Specific Antigen: 8.05 ng/mL — ABNORMAL HIGH (ref 0.00–4.00)

## 2021-12-21 NOTE — Progress Notes (Signed)
PROGRESS NOTE    Aaron Landry  [id number] DOB: 15-Dec-1924 DOA: 12/16/2021 PCP: Burnard Bunting, MD     Brief Narrative:  88 yo WM PMHx recurrent UTIs, BPH, recent visit to the ED 7/1   Presented to [facility] from home due to positive blood cultures, E. coli bacteremia from blood cultures drawn on 12/14/2021 during ED visit. He visited the ED on 7/1 complaining of nausea, vomiting and inability to urinate.   Subjective: 7/8 afebrile overnight A/O x4.  Sitting comfortably in bed.   Assessment & Plan: Covid vaccination;   Principal Problem:   Bacteremia due to Escherichia coli Active Problems:   Acute renal failure superimposed on stage 3b chronic kidney disease (HCC)   E coli bacteremia   Acute retention of urine   History of anemia due to chronic kidney disease   Uncontrolled type 2 diabetes mellitus with hyperglycemia, without long-term current use of insulin (HCC)   Generalized weakness   Hypocalcemia   Positive E. coli Bacteremia -Most likely from untreated prostatitis -Urine culture 7/1 no growth to date.  -CT abdomen pelvis 7/02: Colonic diverticulosis, markedly enlarged and heterogeneous prostate gland.  Stable area of rounded atelectasis and associated small pleural effusion within the right lung base. -Complete 10-day course antibiotics given patient's prostate.  -7/7 change Ceftriaxone---> Cefdinir to complete 10-day course antibiotic.     Acute on CKD STAGE IIIB; (baseline Cr 1.8) Lab Results  Component Value Date   CREATININE 1.89 (H) 12/21/2021   CREATININE 1.74 (H) 12/20/2021   CREATININE 1.69 (H) 12/19/2021   CREATININE 1.83 (H) 12/18/2021   CREATININE 1.78 (H) 12/17/2021  -At baseline   Marked enlarge prostate Gland on CT/Acute urine Retention -Flomax 0.4 mg daily - Strict in and out +578.2 ml - Daily weight  Filed Weights   12/19/21 0348 12/20/21 0500 12/21/21 0500  Weight: 62.5 kg 61.3 kg 60.3 kg  -7/4 Flomax 0.4 mg daily -7/8 PSA  =8.05 -Establish care urology upon discharge.  Dr. [facility]  lactic acidosis:  -In setting of infection.  Continue with IV fluids and IV antibiotics.    Anemia of chronic kidney disease: Lab Results  Component Value Date   HGB 9.9 (L) 12/21/2021   HGB 10.4 (L) 12/20/2021   HGB 10.5 (L) 12/19/2021   HGB 12.0 (L) 12/18/2021   HGB 11.7 (L) 12/17/2021    DM type II uncontrolled with hyperglycemia -7/4 hemoglobin A1c = 7.2 - 7/5 sensitive SSI   Generalized  weakness: -In setting of infection.  -PT OT evaluation.    Hypomagnesemia -Magnesium goal> 2 -7/6 Magnesium IV 3 g  Hypocalcemia - Calcium goal> 8.9 - 7/6 Corrected calcium= 8.8 - 7/6 calcium gluconate 2 g  Goals of care - PT/OT: Recommend home health, PT       Mobility Assessment (last 72 hours)     Mobility Assessment     Row Name 12/21/21 0820 12/20/21 1541 12/20/21 1500 12/19/21 0822 12/18/21 1954   Does patient have an order for bedrest or is patient medically unstable No - Continue assessment -- No - Continue assessment No - Continue assessment No - Continue assessment   What is the highest level of mobility based on the progressive mobility assessment? Level 5 (Walks with assist in room/hall) - Balance while stepping forward/back and can walk in room with assist - Complete Level 5 (Walks with assist in room/hall) - Balance while stepping forward/back and can walk in room with assist - Complete Level 4 (Walks  with assist in room) - Balance while marching in place and cannot step forward and back - Complete Level 5 (Walks with assist in room/hall) - Balance while stepping forward/back and can walk in room with assist - Complete Level 5 (Walks with assist in room/hall) - Balance while stepping forward/back and can walk in room with assist - Complete   Is the above level different from baseline mobility prior to current illness? Yes - Recommend PT order -- Yes - Recommend PT order Yes -  Recommend PT order Yes - Recommend PT order                DVT prophylaxis: Lovenox Code Status: Full Family Communication: 7/5 son at bedside for discussion of plan of care all questions answered Status is: Inpatient    Dispo: The patient is from: Home              Anticipated d/c is to: SNF              Anticipated d/c date is: 2 days              Patient currently is not medically stable to d/c.      Consultants:    Procedures/Significant Events:    I have personally reviewed and interpreted all radiology studies and my findings are as above.  VENTILATOR SETTINGS:    Cultures 7/1 blood RIGHT AC positive E. Coli 7/3 blood RIGHT hand NGTD 7/3 blood LEFT forearm NGTD 7/1 urine negative final   Antimicrobials: Anti-infectives (From admission, onward)    Start     Ordered Stop   12/20/21 1600  cefdinir (OMNICEF) capsule 300 mg        12/20/21 1417 12/27/21 2159   12/17/21 2100  cefTRIAXone (ROCEPHIN) 2 g in sodium chloride 0.9 % 100 mL IVPB  Status:  Discontinued        12/16/21 2231 12/20/21 1417   12/16/21 2100  cefTRIAXone (ROCEPHIN) 1 g in sodium chloride 0.9 % 100 mL IVPB  Status:  Discontinued        12/16/21 2047 12/16/21 2053   12/16/21 2100  cefTRIAXone (ROCEPHIN) 2 g in sodium chloride 0.9 % 100 mL IVPB        12/16/21 2053 12/16/21 2152           Devices    LINES / TUBES:      Continuous Infusions:     Objective: Vitals:   12/20/21 1441 12/20/21 2050 12/21/21 0424 12/21/21 0500  BP: (!) 97/47 138/62 113/66   Pulse: 79 80 77   Resp:  20 18   Temp:  98.6 F (37 C) 97.8 F (36.6 C)   TempSrc:   Oral   SpO2:  98% 99%   Weight:    60.3 kg  Height:        Intake/Output Summary (Last 24 hours) at 12/21/2021 1403 Last data filed at 12/21/2021 0900 Gross per 24 hour  Intake 840 ml  Output 325 ml  Net 515 ml    Filed Weights   12/19/21 0348 12/20/21 0500 12/21/21 0500  Weight: 62.5 kg 61.3 kg 60.3 kg     Examination:  General: A/O x4, No acute respiratory distress, cachectic Eyes: negative scleral hemorrhage, negative anisocoria, negative icterus ENT: Negative Runny nose, negative gingival bleeding, Neck:  Negative scars, masses, torticollis, lymphadenopathy, JVD Lungs: Clear to auscultation bilaterally without wheezes or crackles Cardiovascular: Regular rate and rhythm without murmur gallop or rub normal S1 and S2 Abdomen:  negative abdominal pain, nondistended, positive soft, bowel sounds, no rebound, no ascites, no appreciable mass Extremities: No significant cyanosis, clubbing, or edema bilateral lower extremities Skin: Negative rashes, lesions, ulcers Psychiatric:  Negative depression, negative anxiety, negative fatigue, negative mania  Central nervous system:  Cranial nerves II through XII intact, tongue/uvula midline, all extremities muscle strength 5/5, sensation intact throughout, negative dysarthria, negative expressive aphasia, negative receptive aphasia.  .     Data Reviewed: Care during the described time interval was provided by me .  I have reviewed this patient's available data, including medical history, events of note, physical examination, and all test results as part of my evaluation.  CBC: Recent Labs  Lab 12/17/21 0500 12/18/21 1255 12/19/21 0554 12/20/21 0708 12/21/21 0802  WBC 8.1 5.4 6.1 6.4 7.4  NEUTROABS 5.3 3.5 2.8 3.4 4.0  HGB 11.7* 12.0* 10.5* 10.4* 9.9*  HCT 34.1* 34.6* 30.5* 30.1* 29.5*  MCV 91.9 91.3 90.8 91.2 91.9  PLT 207 170 207 [id number]    Basic Metabolic Panel: Recent Labs  Lab 12/17/21 0500 12/18/21 1255 12/19/21 0554 12/20/21 0708 12/21/21 0802  NA 137 138 138 133* 138  K 3.7 3.6 3.7 3.5 4.1  CL 105 107 106 102 106  CO2 22 23 24 22 26   GLUCOSE 129* 200* 100* 100* 104*  BUN 36* 30* 28* 25* 33*  CREATININE 1.78* 1.83* 1.69* 1.74* 1.89*  CALCIUM 8.2* 8.5* 8.0* 8.1* 8.4*  MG 1.5* 1.7 1.5* 2.1 2.0  PHOS 2.6  --  3.7 3.1 3.7     GFR: Estimated Creatinine Clearance: 20.4 mL/min (A) (by C-G formula based on SCr of 1.89 mg/dL (H)). Liver Function Tests: Recent Labs  Lab 12/17/21 0500 12/18/21 1255 12/19/21 0554 12/20/21 0708 12/21/21 0802  AST 35 26 31 33 33  ALT 23 22 22 27 31   ALKPHOS 70 83 74 76 79  BILITOT 1.0 0.5 0.5 0.7 0.6  PROT 6.6 6.9 5.9* 5.8* 5.7*  ALBUMIN 3.1* 3.3* 3.0* 3.0* 2.8*    No results for input(s): "LIPASE", "AMYLASE" in the last 168 hours. No results for input(s): "AMMONIA" in the last 168 hours. Coagulation Profile: Recent Labs  Lab 12/14/21 2208  INR 1.1    Cardiac Enzymes: No results for input(s): "CKTOTAL", "CKMB", "CKMBINDEX", "TROPONINI" in the last 168 hours. BNP (last 3 results) No results for input(s): "PROBNP" in the last 8760 hours. HbA1C: No results for input(s): "HGBA1C" in the last 72 hours.  CBG: Recent Labs  Lab 12/20/21 2048 12/21/21 0012 12/21/21 0425 12/21/21 0728 12/21/21 1144  GLUCAP 171* 179* 144* 100* 183*    Lipid Profile: No results for input(s): "CHOL", "HDL", "LDLCALC", "TRIG", "CHOLHDL", "LDLDIRECT" in the last 72 hours. Thyroid Function Tests: No results for input(s): "TSH", "T4TOTAL", "FREET4", "T3FREE", "THYROIDAB" in the last 72 hours. Anemia Panel: No results for input(s): "VITAMINB12", "FOLATE", "FERRITIN", "TIBC", "IRON", "RETICCTPCT" in the last 72 hours. Sepsis Labs: Recent Labs  Lab 12/16/21 2102 12/16/21 2235 12/18/21 0735 12/18/21 1022  LATICACIDVEN 2.8* 2.8* 0.8 1.6     Recent Results (from the past 240 hour(s))  Culture, blood (Routine x 2)     Status: None   Collection Time: 12/14/21 10:08 PM   Specimen: BLOOD  Result Value Ref Range Status   Specimen Description   Final    BLOOD LEFT ANTECUBITAL Performed at [facility], [place] [address] [place], [place] [address]    Special Requests   Final    BOTTLES DRAWN AEROBIC ONLY Blood  Culture results may not be optimal due to an inadequate  volume of blood received in culture bottles Performed at [place] [address] [place], [place] [address]    Culture   Final    NO GROWTH 5 DAYS Performed at [facility], [place] [address], [place], [place] [address]    Report Status 12/20/2021 FINAL  Final  Culture, blood (Routine x 2)     Status: Abnormal (Preliminary result)   Collection Time: 12/14/21 10:30 PM   Specimen: BLOOD  Result Value Ref Range Status   Specimen Description   Final    BLOOD RIGHT ANTECUBITAL Performed at [place] [address], [place], [place] [address]    Special Requests   Final    BOTTLES DRAWN AEROBIC AND ANAEROBIC Blood Culture adequate volume Performed at [place] [address] [place], [place] [address]    Culture  Setup Time   Final    GRAM NEGATIVE RODS AEROBIC BOTTLE ONLY CRITICAL RESULT CALLED TO, READ BACK BY AND VERIFIED WITH: RN A. NOAH [phone] @1652  FH GRAM POSITIVE RODS ANAEROBIC BOTTLE ONLY CRITICAL RESULT CALLED TO, READ BACK BY AND VERIFIED WITH: PHARMD Megargel ID:[id number] AT 1046 BY EC    Culture (A)  Final    ESCHERICHIA COLI CULTURE REINCUBATED FOR BETTER GROWTH Performed at [facility], [place] [address], [place], [place] [address]    Report Status PENDING  Incomplete   Organism ID, Bacteria ESCHERICHIA COLI  Final      Susceptibility   Escherichia coli - MIC*    AMPICILLIN <=2 SENSITIVE Sensitive     CEFAZOLIN <=4 SENSITIVE Sensitive     CEFEPIME <=0.12 SENSITIVE Sensitive     CEFTAZIDIME <=1 SENSITIVE Sensitive     CEFTRIAXONE <=0.25 SENSITIVE Sensitive     CIPROFLOXACIN <=0.25 SENSITIVE Sensitive     GENTAMICIN <=1 SENSITIVE Sensitive     IMIPENEM <=0.25 SENSITIVE Sensitive     TRIMETH/SULFA <=20 SENSITIVE Sensitive     AMPICILLIN/SULBACTAM <=2 SENSITIVE Sensitive     PIP/TAZO <=4 SENSITIVE Sensitive     * ESCHERICHIA COLI  Blood Culture ID Panel (Reflexed)     Status: Abnormal    Collection Time: 12/14/21 10:30 PM  Result Value Ref Range Status   Enterococcus faecalis NOT DETECTED NOT DETECTED Final   Enterococcus Faecium NOT DETECTED NOT DETECTED Final   Listeria monocytogenes NOT DETECTED NOT DETECTED Final   Staphylococcus species NOT DETECTED NOT DETECTED Final   Staphylococcus aureus (BCID) NOT DETECTED NOT DETECTED Final   Staphylococcus epidermidis NOT DETECTED NOT DETECTED Final   Staphylococcus lugdunensis NOT DETECTED NOT DETECTED Final   Streptococcus species NOT DETECTED NOT DETECTED Final   Streptococcus agalactiae NOT DETECTED NOT DETECTED Final   Streptococcus pneumoniae NOT DETECTED NOT DETECTED Final   Streptococcus pyogenes NOT DETECTED NOT DETECTED Final   A.calcoaceticus-baumannii NOT DETECTED NOT DETECTED Final   Bacteroides fragilis NOT DETECTED NOT DETECTED Final   Enterobacterales DETECTED (A) NOT DETECTED Final    Comment: Enterobacterales represent a large order of gram negative bacteria, not a single organism. CRITICAL RESULT CALLED TO, READ BACK BY AND VERIFIED WITH: RN A. NOAH [phone] @1652  FH    Enterobacter cloacae complex NOT DETECTED NOT DETECTED Final   Escherichia coli DETECTED (A) NOT DETECTED Final    Comment: CRITICAL RESULT CALLED TO, READ BACK BY AND VERIFIED WITH: RN A. NOAH [phone] @1652  FH    Klebsiella aerogenes NOT DETECTED  NOT DETECTED Final   Klebsiella oxytoca NOT DETECTED NOT DETECTED Final   Klebsiella pneumoniae NOT DETECTED NOT DETECTED Final   Proteus species NOT DETECTED NOT DETECTED Final   Salmonella species NOT DETECTED NOT DETECTED Final   Serratia marcescens NOT DETECTED NOT DETECTED Final   Haemophilus influenzae NOT DETECTED NOT DETECTED Final   Neisseria meningitidis NOT DETECTED NOT DETECTED Final   Pseudomonas aeruginosa NOT DETECTED NOT DETECTED Final   Stenotrophomonas maltophilia NOT DETECTED NOT DETECTED Final   Candida albicans NOT DETECTED NOT DETECTED Final   Candida auris NOT DETECTED  NOT DETECTED Final   Candida glabrata NOT DETECTED NOT DETECTED Final   Candida krusei NOT DETECTED NOT DETECTED Final   Candida parapsilosis NOT DETECTED NOT DETECTED Final   Candida tropicalis NOT DETECTED NOT DETECTED Final   Cryptococcus neoformans/gattii NOT DETECTED NOT DETECTED Final   CTX-M ESBL NOT DETECTED NOT DETECTED Final   Carbapenem resistance IMP NOT DETECTED NOT DETECTED Final   Carbapenem resistance KPC NOT DETECTED NOT DETECTED Final   Carbapenem resistance NDM NOT DETECTED NOT DETECTED Final   Carbapenem resist OXA 48 LIKE NOT DETECTED NOT DETECTED Final   Carbapenem resistance VIM NOT DETECTED NOT DETECTED Final    Comment: Performed at [facility], [address], [place], [place] [address]  Urine Culture     Status: None   Collection Time: 12/14/21 11:25 PM   Specimen: Urine, Clean Catch  Result Value Ref Range Status   Specimen Description   Final    URINE, CLEAN CATCH Performed at [facility], [address], [place], [place] [address]    Special Requests   Final    NONE Performed at [facility], [address], [place], [place] [address]    Culture   Final    NO GROWTH Performed at [facility], [address], [place], [place] [address]    Report Status 12/16/2021 FINAL  Final  Blood culture (routine x 2)     Status: None   Collection Time: 12/16/21  9:04 PM   Specimen: BLOOD RIGHT HAND  Result Value Ref Range Status   Specimen Description   Final    BLOOD RIGHT HAND Performed at [facility], [address], [place], [place] [address]    Special Requests   Final    BOTTLES DRAWN AEROBIC AND ANAEROBIC Blood Culture adequate volume Performed at [facility], [address], [place], [place] [address]    Culture   Final    NO GROWTH 5 DAYS Performed at [facility], [address] [place], [place] [address]    Report Status 12/21/2021 FINAL  Final   Blood culture (routine x 2)     Status: None   Collection Time: 12/16/21  9:06 PM   Specimen: BLOOD LEFT FOREARM  Result Value Ref Range Status   Specimen Description   Final    BLOOD LEFT FOREARM Performed at [facility], [address], [place], [place] [address]    Special Requests   Final    BOTTLES DRAWN AEROBIC AND ANAEROBIC Blood Culture adequate volume Performed at [facility], [address] [place], [place] [address]    Culture   Final    NO GROWTH 5 DAYS Performed at [facility], [address] [place], [place] [address]    Report Status 12/21/2021 FINAL  Final  Urine Culture  Status: None   Collection Time: 12/16/21  9:10 PM   Specimen: Urine, Clean Catch  Result Value Ref Range Status   Specimen Description   Final    URINE, CLEAN CATCH Performed at [facility], [address], [place], [place] [address]    Special Requests   Final    NONE Performed at [facility], [address], [place], [place] [address]    Culture   Final    NO GROWTH Performed at [facility], [address] [place], [place] [address]    Report Status 12/18/2021 FINAL  Final         Radiology Studies: No results found.      Scheduled Meds:  cefdinir  300 mg Oral Q12H   enoxaparin (LOVENOX) injection  30 mg Subcutaneous Q24H   insulin aspart  0-9 Units Subcutaneous Q4H   tamsulosin  0.4 mg Oral Daily   Continuous Infusions:     LOS: 5 days    Time spent:40 min    Kaj Vasil, Roselind Messier, MD [facility]   If 7PM-7AM, please contact night-coverage 12/21/2021, 2:03 PM

## 2021-12-22 DIAGNOSIS — R338 Other retention of urine: Secondary | ICD-10-CM | POA: Diagnosis not present

## 2021-12-22 DIAGNOSIS — R531 Weakness: Secondary | ICD-10-CM | POA: Diagnosis not present

## 2021-12-22 DIAGNOSIS — N179 Acute kidney failure, unspecified: Secondary | ICD-10-CM | POA: Diagnosis not present

## 2021-12-22 DIAGNOSIS — R7881 Bacteremia: Secondary | ICD-10-CM | POA: Diagnosis not present

## 2021-12-22 LAB — CBC WITH DIFFERENTIAL/PLATELET
Abs Immature Granulocytes: 0.07 10*3/uL (ref 0.00–0.07)
Basophils Absolute: 0.1 10*3/uL (ref 0.0–0.1)
Basophils Relative: 1 %
Eosinophils Absolute: 0.7 10*3/uL — ABNORMAL HIGH (ref 0.0–0.5)
Eosinophils Relative: 10 %
HCT: 30.7 % — ABNORMAL LOW (ref 39.0–52.0)
Hemoglobin: 10.3 g/dL — ABNORMAL LOW (ref 13.0–17.0)
Immature Granulocytes: 1 %
Lymphocytes Relative: 26 %
Lymphs Abs: 1.9 10*3/uL (ref 0.7–4.0)
MCH: 31.5 pg (ref 26.0–34.0)
MCHC: 33.6 g/dL (ref 30.0–36.0)
MCV: 93.9 fL (ref 80.0–100.0)
Monocytes Absolute: 0.7 10*3/uL (ref 0.1–1.0)
Monocytes Relative: 9 %
Neutro Abs: 3.8 10*3/uL (ref 1.7–7.7)
Neutrophils Relative %: 53 %
Platelets: 224 10*3/uL (ref 150–400)
RBC: 3.27 MIL/uL — ABNORMAL LOW (ref 4.22–5.81)
RDW: 13.2 % (ref 11.5–15.5)
WBC: 7.2 10*3/uL (ref 4.0–10.5)
nRBC: 0 % (ref 0.0–0.2)

## 2021-12-22 LAB — GLUCOSE, CAPILLARY
Glucose-Capillary: 104 mg/dL — ABNORMAL HIGH (ref 70–99)
Glucose-Capillary: 116 mg/dL — ABNORMAL HIGH (ref 70–99)
Glucose-Capillary: 120 mg/dL — ABNORMAL HIGH (ref 70–99)
Glucose-Capillary: 172 mg/dL — ABNORMAL HIGH (ref 70–99)

## 2021-12-22 LAB — COMPREHENSIVE METABOLIC PANEL
ALT: 35 U/L (ref 0–44)
AST: 37 U/L (ref 15–41)
Albumin: 2.9 g/dL — ABNORMAL LOW (ref 3.5–5.0)
Alkaline Phosphatase: 78 U/L (ref 38–126)
Anion gap: 7 (ref 5–15)
BUN: 34 mg/dL — ABNORMAL HIGH (ref 8–23)
CO2: 24 mmol/L (ref 22–32)
Calcium: 8.3 mg/dL — ABNORMAL LOW (ref 8.9–10.3)
Chloride: 107 mmol/L (ref 98–111)
Creatinine, Ser: 1.9 mg/dL — ABNORMAL HIGH (ref 0.61–1.24)
GFR, Estimated: 32 mL/min — ABNORMAL LOW (ref >60)
Glucose, Bld: 113 mg/dL — ABNORMAL HIGH (ref 70–99)
Potassium: 4.4 mmol/L (ref 3.5–5.1)
Sodium: 138 mmol/L (ref 135–145)
Total Bilirubin: 0.7 mg/dL (ref 0.3–1.2)
Total Protein: 5.8 g/dL — ABNORMAL LOW (ref 6.5–8.1)

## 2021-12-22 LAB — CULTURE, BLOOD (ROUTINE X 2): Special Requests: ADEQUATE

## 2021-12-22 LAB — MAGNESIUM: Magnesium: 2 mg/dL (ref 1.7–2.4)

## 2021-12-22 LAB — PHOSPHORUS: Phosphorus: 3.3 mg/dL (ref 2.5–4.6)

## 2021-12-22 MED ORDER — MELATONIN 5 MG PO TABS: 5.0000 mg | ORAL_TABLET | Freq: Every evening | ORAL | 0 refills | Status: DC | PRN

## 2021-12-22 MED ORDER — CEFDINIR 300 MG PO CAPS: 300.0000 mg | ORAL_CAPSULE | Freq: Two times a day (BID) | ORAL | 0 refills | Status: DC

## 2021-12-22 NOTE — TOC Transition Note (Addendum)
Transition of Care [facility]) - CM/SW Discharge Note   Patient Details  Name: Aaron Landry MRN: [id number] Date of Birth: 06-21-1924  [facility] CM/SW Contact:  Darleene Cleaver, LCSW Phone Number: 12/22/2021, 10:54 AM   Clinical Narrative:     Patient will be going home with home health through [facility]. CSW signing off please reconsult with any other social work needs, home health agency has been notified of planned discharge.  Patient has received rolling walker on Wednesday delivered by [employer].    Final next level of care: Home w Home Health Services Barriers to Discharge: Barriers Resolved   Patient Goals and CMS Choice Patient states their goals for this hospitalization and ongoing recovery are:: To return back home with home health. CMS Medicare.gov Compare Post Acute Care list provided to:: Patient Choice offered to / list presented to : Patient  Discharge Placement                       Discharge Plan and Services In-house Referral: Clinical Social Work Discharge Planning Services: CM Consult Post Acute Care Choice: Home Health, Durable Medical Equipment          DME Arranged: Dan Humphreys rolling DME Agency: [employer] Date DME Agency Contacted: 12/18/21 Time DME Agency Contacted: 1053 Representative spoke with at DME Agency: Vaughan Basta HH Arranged: PT, Nurse's Aide HH Agency: [facility] Date [facility] Agency Contacted: 12/18/21 Time HH Agency Contacted: 1054 Representative spoke with at [facility] Agency: Clifton Custard  Social Determinants of Health (SDOH) Interventions     Readmission Risk Interventions    12/18/2021   11:38 AM  Readmission Risk Prevention Plan  Transportation Screening Complete  PCP or Specialist Appt within 5-7 Days Complete  Home Care Screening Complete  Medication Review (RN CM) Complete

## 2021-12-22 NOTE — Plan of Care (Signed)

## 2021-12-22 NOTE — Discharge Summary (Signed)
Physician Discharge Summary  Aaron Landry QBH:[id number] DOB: 09/23/1924 DOA: 12/16/2021  PCP: Geoffry Paradise, MD  Admit date: 12/16/2021 Discharge date: 12/22/2021  Time spent: 35 minutes  Recommendations for Outpatient Follow-up:   Positive E. coli Bacteremia -Most likely from untreated prostatitis -Urine culture 7/1 no growth to date.  -CT abdomen pelvis 7/02: Colonic diverticulosis, markedly enlarged and heterogeneous prostate gland.  Stable area of rounded atelectasis and associated small pleural effusion within the right lung base. -Complete 10-day course antibiotics given patient's prostate.  -7/7 change Ceftriaxone---> Cefdinir to complete 10-day course antibiotic.     Acute on CKD STAGE IIIB; (baseline Cr 1.8) Lab Results  Component Value Date   CREATININE 1.90 (H) 12/22/2021   CREATININE 1.89 (H) 12/21/2021   CREATININE 1.74 (H) 12/20/2021   CREATININE 1.69 (H) 12/19/2021   CREATININE 1.83 (H) 12/18/2021  -Stable for discharge  Marked enlarge prostate Gland on CT/Acute urine Retention -Flomax 0.4 mg daily - Strict in and out +578.2 ml - Daily weight  Filed Weights   12/20/21 0500 12/21/21 0500 12/22/21 0500  Weight: 61.3 kg 60.3 kg 62.3 kg  -7/4 Flomax 0.4 mg daily -7/8 PSA =8.05 -Establish care urology upon discharge.  Dr. Sebastian [facility]   lactic acidosis:  -In setting of infection.  Continue with IV fluids and IV antibiotics.    Anemia of chronic kidney disease: Lab Results  Component Value Date   HGB 10.3 (L) 12/22/2021   HGB 9.9 (L) 12/21/2021   HGB 10.4 (L) 12/20/2021   HGB 10.5 (L) 12/19/2021   HGB 12.0 (L) 12/18/2021  -Stable  DM type II uncontrolled with hyperglycemia -7/4 hemoglobin A1c = 7.2 - 7/5 sensitive SSI   Generalized  weakness: -In setting of infection.  -PT OT evaluation.    Hypomagnesemia -Magnesium goal> 2 -7/6 Magnesium IV 3 g   Hypocalcemia - Calcium goal> 8.9 - 7/6 Corrected calcium= 8.8 - 7/6  calcium gluconate 2 g   Goals of care - PT/OT: Recommend home health, PT    Discharge Diagnoses:  Principal Problem:   Bacteremia due to Escherichia coli Active Problems:   Acute renal failure superimposed on stage 3b chronic kidney disease (HCC)   E coli bacteremia   Acute retention of urine   History of anemia due to chronic kidney disease   Uncontrolled type 2 diabetes mellitus with hyperglycemia, without long-term current use of insulin (HCC)   Generalized weakness   Hypocalcemia   Discharge Condition: Stable  Diet recommendation: Heart healthy/carb modified  Filed Weights   12/20/21 0500 12/21/21 0500 12/22/21 0500  Weight: 61.3 kg 60.3 kg 62.3 kg    History of present illness:  88 yo WM PMHx recurrent UTIs, BPH, recent visit to the ED 7/1    Presented to [facility] from home due to positive blood cultures, E. coli bacteremia from blood cultures drawn on 12/14/2021 during ED visit. He visited the ED on 7/1 complaining of nausea, vomiting and inability to urinate.  Hospital Course:  See above   Cultures  7/1 blood RIGHT AC positive E. Coli 7/3 blood RIGHT hand negative final 7/3 blood LEFT forearm negative final 7/1 urine negative final  Antibiotics Anti-infectives (From admission, onward)    Start     Ordered Stop   12/20/21 1600  cefdinir (OMNICEF) capsule 300 mg        12/20/21 1417 12/27/21 2159   12/17/21 2100  cefTRIAXone (ROCEPHIN) 2 g in sodium chloride 0.9 % 100 mL IVPB  Status:  Discontinued        12/16/21 2231 12/20/21 1417   12/16/21 2100  cefTRIAXone (ROCEPHIN) 1 g in sodium chloride 0.9 % 100 mL IVPB  Status:  Discontinued        12/16/21 2047 12/16/21 2053   12/16/21 2100  cefTRIAXone (ROCEPHIN) 2 g in sodium chloride 0.9 % 100 mL IVPB        12/16/21 2053 12/16/21 2152         Discharge Exam: Vitals:   12/21/21 1421 12/21/21 2014 12/22/21 0405 12/22/21 0500  BP: 111/62 (!) 104/51 127/63   Pulse: 81 79 79   Resp: 18 16 18    Temp:  97.7 F (36.5 C) 98.3 F (36.8 C) 98.2 F (36.8 C)   TempSrc: Oral Oral Oral   SpO2: 97% 98% 98%   Weight:    62.3 kg  Height:        General: A/O x4, No acute respiratory distress, cachectic Eyes: negative scleral hemorrhage, negative anisocoria, negative icterus ENT: Negative Runny nose, negative gingival bleeding, Neck:  Negative scars, masses, torticollis, lymphadenopathy, JVD Lungs: Clear to auscultation bilaterally without wheezes or crackles Cardiovascular: Regular rate and rhythm without murmur gallop or rub normal S1 and S2 Abdomen: negative abdominal pain, nondistended, positive soft, bowel sounds, no rebound, no ascites, no appreciable mass  Discharge Instructions   Allergies as of 12/22/2021       Reactions   Codeine Itching, Rash, Other (See Comments)   General discomfort   Penicillins Itching, Rash   Did it involve swelling of the face/tongue/throat, SOB, or low BP? No Did it involve sudden or severe rash/hives, skin peeling, or any reaction on the inside of your mouth or nose? Yes Did you need to seek medical attention at a hospital or doctor's office? No When did it last happen?   Decades Ago    If all above answers are "NO", may proceed with cephalosporin use.        Medication List     STOP taking these medications    lisinopril-hydrochlorothiazide 20-12.5 MG tablet Commonly known as: ZESTORETIC       TAKE these medications    BC HEADACHE PO Take 1 packet by mouth 2 (two) times daily as needed (pain).   cefdinir 300 MG capsule Commonly known as: OMNICEF Take 1 capsule (300 mg total) by mouth every 12 (twelve) hours.   melatonin 5 MG Tabs Take 1 tablet (5 mg total) by mouth at bedtime as needed.   ondansetron 4 MG disintegrating tablet Commonly known as: ZOFRAN-ODT Take 1 tablet (4 mg total) by mouth every 8 (eight) hours as needed for nausea.   polyethylene glycol 17 g packet Commonly known as: MIRALAX / GLYCOLAX Take 17 g by mouth  daily as needed for mild constipation.   tamsulosin 0.4 MG Caps capsule Commonly known as: FLOMAX Take 1 capsule (0.4 mg total) by mouth daily.   traMADol 50 MG tablet Commonly known as: ULTRAM Take 1 tablet (50 mg total) by mouth every 6 (six) hours as needed for moderate pain.               Durable Medical Equipment  (From admission, onward)           Start     Ordered   12/20/21 1425  For home use only DME 4 wheeled rolling walker with seat  Once       Question:  Patient needs a walker to treat with the following condition  Answer:  Physical deconditioning   12/20/21 1424           Allergies  Allergen Reactions   Codeine Itching, Rash and Other (See Comments)    General discomfort   Penicillins Itching and Rash    Did it involve swelling of the face/tongue/throat, SOB, or low BP? No Did it involve sudden or severe rash/hives, skin peeling, or any reaction on the inside of your mouth or nose? Yes Did you need to seek medical attention at a hospital or doctor's office? No When did it last happen?   Decades Ago    If all above answers are "NO", may proceed with cephalosporin use.        The results of significant diagnostics from this hospitalization (including imaging, microbiology, ancillary and laboratory) are listed below for reference.    Significant Diagnostic Studies: DG Chest Port 1 View  Result Date: 12/16/2021 CLINICAL DATA:  fever, positive blood culture EXAM: PORTABLE CHEST 1 VIEW COMPARISON:  Chest x-ray 12/14/2021 FINDINGS: The heart and mediastinal contours are unchanged. Aortic calcification. Cortical scarring of the right lower lung zone. No focal consolidation. No pulmonary edema. No pleural effusion. No pneumothorax. No acute osseous abnormality. Heterotopic calcification along the left proximal humerus. IMPRESSION: No active disease. Electronically Signed   By: Iven Finn M.D.   On: 12/16/2021 20:52   CT ABDOMEN PELVIS W  CONTRAST  Result Date: 12/15/2021 CLINICAL DATA:  Nausea, vomiting, abdominal pain and fever. EXAM: CT ABDOMEN AND PELVIS WITH CONTRAST TECHNIQUE: Multidetector CT imaging of the abdomen and pelvis was performed using the standard protocol following bolus administration of intravenous contrast. RADIATION DOSE REDUCTION: This exam was performed according to the departmental dose-optimization program which includes automated exposure control, adjustment of the mA and/or kV according to patient size and/or use of iterative reconstruction technique. CONTRAST:  158mL OMNIPAQUE IOHEXOL 300 MG/ML  SOLN COMPARISON:  September 29, 2014 FINDINGS: Lower chest: A stable area of partially calcified low attenuation is seen within the posteromedial aspect of the right lung base. A small, adjacent chronic right pleural effusion is seen. Hepatobiliary: No focal liver abnormality is seen. No gallstones, gallbladder wall thickening, or biliary dilatation. Pancreas: Unremarkable. No pancreatic ductal dilatation or surrounding inflammatory changes. Spleen: Normal in size without focal abnormality. Adrenals/Urinary Tract: Adrenal glands are unremarkable. Kidneys are normal in size, without renal calculi or hydronephrosis. Multiple bilateral renal cysts are seen. Bladder is unremarkable. Stomach/Bowel: Stomach is within normal limits. Appendix appears normal. A large amount of stool is seen throughout the colon. No evidence of bowel wall thickening, distention, or inflammatory changes. Noninflamed diverticula are seen throughout the large bowel. Vascular/Lymphatic: Aortic atherosclerosis. No enlarged abdominal or pelvic lymph nodes. Reproductive: The prostate gland is markedly enlarged and heterogeneous in appearance. Other: There is a 2.8 cm x 1.8 cm fat containing right inguinal hernia. Multiple surrounding surgical coils are seen within this region. No abdominopelvic ascites. Musculoskeletal: Multilevel degenerative changes are seen  throughout the lumbar spine. IMPRESSION: 1. Colonic diverticulosis. 2. Markedly enlarged and heterogeneous prostate gland. Correlation with PSA values is recommended. 3. Fat-containing right inguinal hernia. 4. Stable area of rounded atelectasis and associated small pleural effusion within the right lung base. 5. Aortic atherosclerosis. Aortic Atherosclerosis (ICD10-I70.0). Electronically Signed   By: Virgina Norfolk M.D.   On: 12/15/2021 00:50   DG Chest 2 View  Result Date: 12/14/2021 CLINICAL DATA:  Fever, nausea and vomiting. EXAM: CHEST - 2 VIEW COMPARISON:  Chest plain film, dated June 03, 2020, and abdomen  and pelvis CT, dated September 29, 2014 FINDINGS: The heart size and mediastinal contours are within normal limits. There is marked severity calcification of the thoracic aorta. Mild, stable linear scarring and/or atelectasis is seen within the right lower lobe. An ill-defined focal opacity is seen overlying the medial aspect of the right lung base. This is seen on the prior study and is unchanged in appearance. The visualized skeletal structures are unremarkable. IMPRESSION: 1. Mild, stable right lower lobe linear scarring and/or atelectasis. 2. Findings which correspond to an area of rounded atelectasis seen within the posterior right lung base on the prior abdomen and pelvis CT. Electronically Signed   By: Aram Candela M.D.   On: 12/14/2021 22:38    Microbiology: Recent Results (from the past 240 hour(s))  Culture, blood (Routine x 2)     Status: None   Collection Time: 12/14/21 10:08 PM   Specimen: BLOOD  Result Value Ref Range Status   Specimen Description   Final    BLOOD LEFT ANTECUBITAL Performed at [facility], [address], [place], [place] [address]    Special Requests   Final    BOTTLES DRAWN AEROBIC ONLY Blood Culture results may not be optimal due to an inadequate volume of blood received in culture bottles Performed at [facility], [address] [place], [place] [address]    Culture   Final    NO GROWTH 5 DAYS Performed at [facility], [address] [place], [place] [address]    Report Status 12/20/2021 FINAL  Final  Culture, blood (Routine x 2)     Status: Abnormal   Collection Time: 12/14/21 10:30 PM   Specimen: BLOOD  Result Value Ref Range Status   Specimen Description   Final    BLOOD RIGHT ANTECUBITAL Performed at [facility], [address], [place], [place] [address]    Special Requests   Final    BOTTLES DRAWN AEROBIC AND ANAEROBIC Blood Culture adequate volume Performed at [facility], [address], [place], [place] [address]    Culture  Setup Time   Final    GRAM NEGATIVE RODS AEROBIC BOTTLE ONLY CRITICAL RESULT CALLED TO, READ BACK BY AND VERIFIED WITH: RN A. NOAH [phone] @1652  FH CRITICAL RESULT CALLED TO, READ BACK BY AND VERIFIED WITH: PHARMD MARY SWAIN AT 1046 BY EC CORRECTED RESULTS GRAM POSITIVE COCCI PREVIOUSLY REPORTED AS: GRAM POSITIVE RODS CORRECTED RESULTS CALLED TO: PHARMD A.TECKER AT [id number] ON 12/22/2021 BY T.SAAD.    Culture (A)  Final    ESCHERICHIA COLI PARVIMONAS MICRA Standardized susceptibility testing for this organism is not available. Performed at [facility], [address], [place], [place]    Report Status 12/22/2021 FINAL  Final   Organism ID, Bacteria ESCHERICHIA COLI  Final      Susceptibility   Escherichia coli - MIC*    AMPICILLIN <=2 SENSITIVE Sensitive     CEFAZOLIN <=4 SENSITIVE Sensitive     CEFEPIME <=0.12 SENSITIVE Sensitive     CEFTAZIDIME <=1 SENSITIVE Sensitive     CEFTRIAXONE <=0.25 SENSITIVE Sensitive     CIPROFLOXACIN <=0.25 SENSITIVE Sensitive     GENTAMICIN <=1 SENSITIVE Sensitive     IMIPENEM <=0.25 SENSITIVE Sensitive     TRIMETH/SULFA <=20 SENSITIVE Sensitive     AMPICILLIN/SULBACTAM <=2 SENSITIVE Sensitive     PIP/TAZO <=4 SENSITIVE Sensitive     *  ESCHERICHIA COLI  Blood Culture ID  Panel (Reflexed)     Status: Abnormal   Collection Time: 12/14/21 10:30 PM  Result Value Ref Range Status   Enterococcus faecalis NOT DETECTED NOT DETECTED Final   Enterococcus Faecium NOT DETECTED NOT DETECTED Final   Listeria monocytogenes NOT DETECTED NOT DETECTED Final   Staphylococcus species NOT DETECTED NOT DETECTED Final   Staphylococcus aureus (BCID) NOT DETECTED NOT DETECTED Final   Staphylococcus epidermidis NOT DETECTED NOT DETECTED Final   Staphylococcus lugdunensis NOT DETECTED NOT DETECTED Final   Streptococcus species NOT DETECTED NOT DETECTED Final   Streptococcus agalactiae NOT DETECTED NOT DETECTED Final   Streptococcus pneumoniae NOT DETECTED NOT DETECTED Final   Streptococcus pyogenes NOT DETECTED NOT DETECTED Final   A.calcoaceticus-baumannii NOT DETECTED NOT DETECTED Final   Bacteroides fragilis NOT DETECTED NOT DETECTED Final   Enterobacterales DETECTED (A) NOT DETECTED Final    Comment: Enterobacterales represent a large order of gram negative bacteria, not a single organism. CRITICAL RESULT CALLED TO, READ BACK BY AND VERIFIED WITH: RN A. NOAH [phone] @1652  FH    Enterobacter cloacae complex NOT DETECTED NOT DETECTED Final   Escherichia coli DETECTED (A) NOT DETECTED Final    Comment: CRITICAL RESULT CALLED TO, READ BACK BY AND VERIFIED WITH: RN A. NOAH [phone] @1652  FH    Klebsiella aerogenes NOT DETECTED NOT DETECTED Final   Klebsiella oxytoca NOT DETECTED NOT DETECTED Final   Klebsiella pneumoniae NOT DETECTED NOT DETECTED Final   Proteus species NOT DETECTED NOT DETECTED Final   Salmonella species NOT DETECTED NOT DETECTED Final   Serratia marcescens NOT DETECTED NOT DETECTED Final   Haemophilus influenzae NOT DETECTED NOT DETECTED Final   Neisseria meningitidis NOT DETECTED NOT DETECTED Final   Pseudomonas aeruginosa NOT DETECTED NOT DETECTED Final   Stenotrophomonas maltophilia NOT DETECTED NOT DETECTED Final    Candida albicans NOT DETECTED NOT DETECTED Final   Candida auris NOT DETECTED NOT DETECTED Final   Candida glabrata NOT DETECTED NOT DETECTED Final   Candida krusei NOT DETECTED NOT DETECTED Final   Candida parapsilosis NOT DETECTED NOT DETECTED Final   Candida tropicalis NOT DETECTED NOT DETECTED Final   Cryptococcus neoformans/gattii NOT DETECTED NOT DETECTED Final   CTX-M ESBL NOT DETECTED NOT DETECTED Final   Carbapenem resistance IMP NOT DETECTED NOT DETECTED Final   Carbapenem resistance KPC NOT DETECTED NOT DETECTED Final   Carbapenem resistance NDM NOT DETECTED NOT DETECTED Final   Carbapenem resist OXA 48 LIKE NOT DETECTED NOT DETECTED Final   Carbapenem resistance VIM NOT DETECTED NOT DETECTED Final    Comment: Performed at [facility], [address], [place], [place] [address]  Urine Culture     Status: None   Collection Time: 12/14/21 11:25 PM   Specimen: Urine, Clean Catch  Result Value Ref Range Status   Specimen Description   Final    URINE, CLEAN CATCH Performed at [facility], [place] [address] [place], [place] [address]    Special Requests   Final    NONE Performed at [facility], [place] [address], [place], [place] [address]    Culture   Final    NO GROWTH Performed at [facility], [place] [address] [place], [place] [address]    Report Status 12/16/2021 FINAL  Final  Blood culture (routine x 2)     Status: None   Collection Time: 12/16/21  9:04 PM   Specimen: BLOOD RIGHT HAND  Result Value Ref Range Status   Specimen  Description   Final    BLOOD RIGHT HAND Performed at [facility], [place] [address] [place], [place] [address]    Special Requests   Final    BOTTLES DRAWN AEROBIC AND ANAEROBIC Blood Culture adequate volume Performed at [place] [address], [place], [place] [address]    Culture   Final    NO GROWTH 5 DAYS Performed at [facility], [place] [address], [place], [place] [address]    Report Status 12/21/2021 FINAL  Final  Blood culture (routine x 2)     Status: None   Collection Time: 12/16/21  9:06 PM   Specimen: BLOOD LEFT FOREARM  Result Value Ref Range Status   Specimen Description   Final    BLOOD LEFT FOREARM Performed at [place] [address] [place], [place] [address]    Special Requests   Final    BOTTLES DRAWN AEROBIC AND ANAEROBIC Blood Culture adequate volume Performed at [place] [address] [place], [place] [address]    Culture   Final    NO GROWTH 5 DAYS Performed at [facility], [place] [address], [place], [place] [address]    Report Status 12/21/2021 FINAL  Final  Urine Culture     Status: None   Collection Time: 12/16/21  9:10 PM   Specimen: Urine, Clean Catch  Result Value Ref Range Status   Specimen Description   Final    URINE, CLEAN CATCH Performed at [facility], [place] [address], [place], [place] [address]    Special Requests   Final    NONE Performed at [facility], [place] [address], [place], [place] [address]    Culture   Final    NO GROWTH Performed at [facility], [place] [address], [place], [place] [address]    Report Status 12/18/2021 FINAL  Final     Labs: Basic Metabolic Panel: Recent Labs  Lab 12/17/21 0500 12/18/21 1255 12/19/21 0554 12/20/21 0708 12/21/21 0802 12/22/21 0547  NA 137 138 138 133* 138 138  K 3.7 3.6 3.7 3.5 4.1 4.4  CL 105 107 106 102 106 107  CO2 22 23 24 22 26 24   GLUCOSE 129* 200* 100* 100* 104* 113*  BUN 36* 30* 28* 25* 33* 34*  CREATININE 1.78* 1.83* 1.69* 1.74* 1.89* 1.90*  CALCIUM 8.2* 8.5* 8.0* 8.1* 8.4* 8.3*  MG 1.5* 1.7 1.5* 2.1 2.0 2.0  PHOS 2.6  --  3.7 3.1 3.7 3.3   Liver Function Tests: Recent Labs  Lab 12/18/21 1255 12/19/21 0554 12/20/21 0708 12/21/21 0802 12/22/21 0547  AST 26 31 33 33 37  ALT 22 22 27 31  35  ALKPHOS 83 74 76 79  78  BILITOT 0.5 0.5 0.7 0.6 0.7  PROT 6.9 5.9* 5.8* 5.7* 5.8*  ALBUMIN 3.3* 3.0* 3.0* 2.8* 2.9*   No results for input(s): "LIPASE", "AMYLASE" in the last 168 hours. No results for input(s): "AMMONIA" in the last 168 hours. CBC: Recent Labs  Lab 12/18/21 1255 12/19/21 0554 12/20/21 0708 12/21/21 0802 12/22/21 0547  WBC 5.4 6.1 6.4 7.4 7.2  NEUTROABS 3.5 2.8 3.4 4.0 3.8  HGB 12.0* 10.5* 10.4* 9.9* 10.3*  HCT 34.6* 30.5* 30.1* 29.5* 30.7*  MCV 91.3 90.8 91.2 91.9 93.9  PLT 170 207 215 215 224   Cardiac Enzymes: No results for input(s): "CKTOTAL", "CKMB", "CKMBINDEX", "TROPONINI" in the last 168 hours. BNP:  BNP (last 3 results) No results for input(s): "BNP" in the last 8760 hours.  ProBNP (last 3 results) No results for input(s): "PROBNP" in the last 8760 hours.  CBG: Recent Labs  Lab 12/21/21 1609 12/21/21 2013 12/22/21 0039 12/22/21 0406 12/22/21 0748  GLUCAP 158* 194* 104* 120* 116*       Signed:  Dia Crawford, MD [facility]

## 2021-12-22 NOTE — Progress Notes (Addendum)
PHARMACY - PHYSICIAN COMMUNICATION CRITICAL VALUE ALERT - BLOOD CULTURE IDENTIFICATION (BCID)  Aaron Landry is an 88 y.o. male who presented to [facility] on 12/16/2021 after blood cultures positive for 1/4 Ecoli.  Patient was seen at [facility] ED on 7/1 for fever, nausea, difficulty urinating.  History of recurrent UTI.  Assessment:  Update to blood cultures from 7/1 originally showing Gram positive rods in 1/3 bottles thought to be contaminant (reported on 7/5) - instead is growing Gram positive cocci, identified as Parvimonas micra. Repeat blood cultures from 7/3 are no growth x 5 days (Final).  Patient is currently on cefdinir 300mg  PO q12h through 7/14 for Ecoli bacteremia.  Name of physician (or Provider) Contacted: Dr. 8/14  Current antibiotics: Cefdinir 300mg  PO q12h  Changes to prescribed antibiotics recommended:  Patient is on recommended antibiotics - No changes needed Updated organism is still likely a contaminant.   Results for orders placed or performed during the hospital encounter of 12/14/21  Blood Culture ID Panel (Reflexed) (Collected: 12/14/2021 10:30 PM)  Result Value Ref Range   Enterococcus faecalis NOT DETECTED NOT DETECTED   Enterococcus Faecium NOT DETECTED NOT DETECTED   Listeria monocytogenes NOT DETECTED NOT DETECTED   Staphylococcus species NOT DETECTED NOT DETECTED   Staphylococcus aureus (BCID) NOT DETECTED NOT DETECTED   Staphylococcus epidermidis NOT DETECTED NOT DETECTED   Staphylococcus lugdunensis NOT DETECTED NOT DETECTED   Streptococcus species NOT DETECTED NOT DETECTED   Streptococcus agalactiae NOT DETECTED NOT DETECTED   Streptococcus pneumoniae NOT DETECTED NOT DETECTED   Streptococcus pyogenes NOT DETECTED NOT DETECTED   A.calcoaceticus-baumannii NOT DETECTED NOT DETECTED   Bacteroides fragilis NOT DETECTED NOT DETECTED   Enterobacterales DETECTED (A) NOT DETECTED   Enterobacter cloacae complex NOT DETECTED NOT DETECTED   Escherichia coli  DETECTED (A) NOT DETECTED   Klebsiella aerogenes NOT DETECTED NOT DETECTED   Klebsiella oxytoca NOT DETECTED NOT DETECTED   Klebsiella pneumoniae NOT DETECTED NOT DETECTED   Proteus species NOT DETECTED NOT DETECTED   Salmonella species NOT DETECTED NOT DETECTED   Serratia marcescens NOT DETECTED NOT DETECTED   Haemophilus influenzae NOT DETECTED NOT DETECTED   Neisseria meningitidis NOT DETECTED NOT DETECTED   Pseudomonas aeruginosa NOT DETECTED NOT DETECTED   Stenotrophomonas maltophilia NOT DETECTED NOT DETECTED   Candida albicans NOT DETECTED NOT DETECTED   Candida auris NOT DETECTED NOT DETECTED   Candida glabrata NOT DETECTED NOT DETECTED   Candida krusei NOT DETECTED NOT DETECTED   Candida parapsilosis NOT DETECTED NOT DETECTED   Candida tropicalis NOT DETECTED NOT DETECTED   Cryptococcus neoformans/gattii NOT DETECTED NOT DETECTED   CTX-M ESBL NOT DETECTED NOT DETECTED   Carbapenem resistance IMP NOT DETECTED NOT DETECTED   Carbapenem resistance KPC NOT DETECTED NOT DETECTED   Carbapenem resistance NDM NOT DETECTED NOT DETECTED   Carbapenem resist OXA 48 LIKE NOT DETECTED NOT DETECTED   Carbapenem resistance VIM NOT DETECTED NOT DETECTED    02/14/22, PharmD 12/22/2021 8:46 AM

## 2021-12-23 NOTE — Progress Notes (Signed)
Pharmacy: Micro Update  75 YOM who originally presented to ED on 7/1 with fever, nausea, vomiting - with cultures drawn and found to have E.coli bacteremia - called back and admitted on 7/3.   Blood cultures on 7/3 admission showed no growth, however 7/1 cultures with the original E.coli have now also grown an anaerobe Parvimonas Micra - which appears to be normally found in the oral/resp/GI tract.   The patient received Rocephin from 7/3-7/6, was transitioned to Cefdinir on 7/7 and discharged on 7/9 to complete 6 more days of therapy.   Per my review - Rocephin may cover Parvimonas - but unlikely to be covered by Cefdinir.   Plan - Dr. Joseph Art made aware of updated anaerobe in blood cultures and will decide on course of action  Thank you for allowing pharmacy to be a part of this patient's care.  Georgina Pillion, PharmD, BCPS Infectious Diseases Clinical Pharmacist 12/23/2021 10:24 AM   **Pharmacist phone directory can now be found on [email or web address] ([email or web address]).  Listed under [facility].

## 2022-02-07 ENCOUNTER — Encounter: Payer: Self-pay | Admitting: Podiatrist

## 2022-02-07 ENCOUNTER — Ambulatory Visit: Payer: Medicare Other | Admitting: Podiatrist

## 2022-02-07 DIAGNOSIS — M79676 Pain in unspecified toe(s): Secondary | ICD-10-CM

## 2022-02-07 DIAGNOSIS — B351 Tinea unguium: Secondary | ICD-10-CM

## 2022-02-07 NOTE — Progress Notes (Signed)
Subjective: Aaron Landry is a 88 y.o. male patient who presents to office today with concern of long,mildly painful nails  while ambulating in shoes; unable to trim.  Patient denies any new cramping, numbness, burning or tingling in the legs or feet. His 89th birthday is today. He presents today with his caregiver.   Patient Active Problem List   Diagnosis Date Noted   E coli bacteremia 12/20/2021   Acute retention of urine 12/20/2021   History of anemia due to chronic kidney disease 12/20/2021   Uncontrolled type 2 diabetes mellitus with hyperglycemia, without long-term current use of insulin (HCC) 12/20/2021   Generalized weakness 12/20/2021   Hypocalcemia 12/20/2021   Bacteremia due to Escherichia coli 12/16/2021   Anemia of chronic disease 07/18/2021   Diabetic renal disease (HCC) 07/18/2021   Hyponatremia 07/18/2021   Leukocytosis 07/18/2021   Onychomycosis 07/18/2021   Acute metabolic encephalopathy 06/04/2020   Complicated UTI (urinary tract infection) 06/03/2020   Acute renal failure superimposed on stage 3b chronic kidney disease (HCC) 06/03/2020   Hypomagnesemia 06/03/2020   Essential hypertension 06/03/2020   GERD without esophagitis 06/03/2020   Chronic kidney disease due to hypertension 10/10/2019   Chronic kidney disease, stage 3b (HCC) 10/10/2019   Non-thrombocytopenic purpura (HCC) 11/10/2018   Pain in left knee 10/21/2017   Abnormal gait 01/28/2016   Encounter for general adult medical examination without abnormal findings 12/13/2014   Type 2 diabetes mellitus with stage 3b chronic kidney disease, without long-term current use of insulin (HCC)    Hyperkalemia 08/24/2014   Liver abscess 08/24/2014   Abscess, liver    Osteoarthritis 09/28/2013   Diabetic peripheral neuropathy associated with type 2 diabetes mellitus (HCC) 07/12/2012   Degeneration of lumbar intervertebral disc 06/15/2012   Peripheral vascular disease, unspecified (HCC) 01/27/2012   ED (erectile  dysfunction) of organic origin 07/16/2011   Elevated prostate specific antigen (PSA) 07/16/2011   Benign prostatic hyperplasia with lower urinary tract symptoms 06/07/2009   Hyperlipidemia 06/07/2009   ABSCESS, LIVER, PYOGENIC 10/20/2008   Diabetes (HCC) 10/19/2008   HYPERTENSION NEC 10/19/2008   Dehydration with hyponatremia 10/08/2008   Hypokalemia, inadequate intake 10/08/2008   Current Outpatient Medications on File Prior to Visit  Medication Sig Dispense Refill   Aspirin-Salicylamide-Caffeine (BC HEADACHE PO) Take 1 packet by mouth 2 (two) times daily as needed (pain).     cefdinir (OMNICEF) 300 MG capsule Take 1 capsule (300 mg total) by mouth every 12 (twelve) hours. 12 capsule 0   melatonin 5 MG TABS Take 1 tablet (5 mg total) by mouth at bedtime as needed. 30 tablet 0   ondansetron (ZOFRAN-ODT) 4 MG disintegrating tablet Take 1 tablet (4 mg total) by mouth every 8 (eight) hours as needed for nausea. 10 tablet 0   polyethylene glycol (MIRALAX / GLYCOLAX) 17 g packet Take 17 g by mouth daily as needed for mild constipation. (Patient not taking: Reported on 12/14/2021) 14 each 0   tamsulosin (FLOMAX) 0.4 MG CAPS capsule Take 1 capsule (0.4 mg total) by mouth daily. (Patient not taking: Reported on 12/14/2021) 30 capsule 2   traMADol (ULTRAM) 50 MG tablet Take 1 tablet (50 mg total) by mouth every 6 (six) hours as needed for moderate pain. (Patient not taking: Reported on 12/14/2021) 15 tablet 0   No current facility-administered medications on file prior to visit.   Allergies  Allergen Reactions   Codeine Itching, Rash and Other (See Comments)    General discomfort   Penicillins Itching and Rash  Did it involve swelling of the face/tongue/throat, SOB, or low BP? No Did it involve sudden or severe rash/hives, skin peeling, or any reaction on the inside of your mouth or nose? Yes Did you need to seek medical attention at a hospital or doctor's office? No When did it last happen?    Decades Ago    If all above answers are "NO", may proceed with cephalosporin use.       Physical Exam   GENERAL APPEARANCE: Alert, conversant. Appropriately groomed. No acute distress.    VASCULAR: Pedal pulses palpable DP and PT bilateral.  Capillary refill time is immediate to all digits,  Proximal to distal cooling it warm to warm.  Digital perfusion adequate.    NEUROLOGIC: sensation is absent to 5.07 monofilament at 5/5 sites bilateral.  Light touch is absent bilateral, vibratory sensation diminished bilateral   MUSCULOSKELETAL: acceptable muscle strength, tone and stability bilateral.  High arch foot type is noted right with hammertoes 1-5 right foot, on the left foot no gross boney pedal deformities noted.  No pain, crepitus or limitation noted with foot and ankle range of motion bilateral.    DERMATOLOGIC: skin is warm, supple, and dry.  No open lesions noted.  No rash, no pre ulcerative lesions. Digital nails are thick, discolored, dystrophic, brittle with subungual debris present and clinically mycotic x 10.     Assessment and Plan: Problem List Items Addressed This Visit   None Visit Diagnoses     Pain due to onychomycosis of nail    -  Primary       -Examined patient. -Mechanically debrided all nails 1-5 bilateral using sterile nail nipper and filed with sterile dremel without incident  -Answered all patient questions -Patient to return  in 3 months for continued foot care.  -Patient advised to call the office if any problems or questions arise in the meantime.  Delories Heinz, DPM

## 2023-02-10 ENCOUNTER — Ambulatory Visit: Payer: Medicare Other | Admitting: Podiatry

## 2023-02-10 DIAGNOSIS — E1142 Type 2 diabetes mellitus with diabetic polyneuropathy: Secondary | ICD-10-CM

## 2023-02-10 DIAGNOSIS — B351 Tinea unguium: Secondary | ICD-10-CM

## 2023-02-10 DIAGNOSIS — I739 Peripheral vascular disease, unspecified: Secondary | ICD-10-CM

## 2023-02-10 DIAGNOSIS — M79609 Pain in unspecified limb: Secondary | ICD-10-CM

## 2023-02-10 NOTE — Progress Notes (Signed)
This patient returns to my office for at risk foot care.  This patient requires this care by a professional since this patient will be at risk due to having DM, renal disease and PVD. This patient is unable to cut nails himself since the patient cannot reach his nails.These nails are painful walking and wearing shoes.  He presents to the office with her son. This patient presents for at risk foot care today.  General Appearance  Alert, conversant and in no acute stress.  Vascular  Dorsalis pedis and posterior tibial  pulses are  weakly palpable  bilaterally.  Capillary return is within normal limits  bilaterally. Temperature is within normal limits  bilaterally.  Neurologic  Senn-Weinstein monofilament wire test within normal limits  bilaterally. Muscle power within normal limits bilaterally.  Nails Thick disfigured discolored nails with subungual debris  from hallux to fifth toes bilaterally. No evidence of bacterial infection or drainage bilaterally.  Orthopedic  No limitations of motion  feet .  No crepitus or effusions noted.  No bony pathology or digital deformities noted.  Skin  normotropic skin with no porokeratosis noted bilaterally.  No signs of infections or ulcers noted.     Onychomycosis  Pain in right toes  Pain in left toes  Consent was obtained for treatment procedures.   Mechanical debridement of nails 1-5  bilaterally performed with a nail nipper.  Filed with dremel without incident.    Return office visit     6 months                 Told patient to return for periodic foot care and evaluation due to potential at risk complications.   Helane Gunther DPM

## 2023-04-20 ENCOUNTER — Ambulatory Visit (INDEPENDENT_AMBULATORY_CARE_PROVIDER_SITE_OTHER): Payer: Medicare Other | Admitting: Podiatry

## 2023-04-20 ENCOUNTER — Encounter: Payer: Self-pay | Admitting: Podiatry

## 2023-04-20 DIAGNOSIS — E1142 Type 2 diabetes mellitus with diabetic polyneuropathy: Secondary | ICD-10-CM | POA: Diagnosis not present

## 2023-04-20 DIAGNOSIS — I739 Peripheral vascular disease, unspecified: Secondary | ICD-10-CM | POA: Diagnosis not present

## 2023-04-20 DIAGNOSIS — M79609 Pain in unspecified limb: Secondary | ICD-10-CM | POA: Diagnosis not present

## 2023-04-20 DIAGNOSIS — B351 Tinea unguium: Secondary | ICD-10-CM | POA: Diagnosis not present

## 2023-04-20 NOTE — Progress Notes (Signed)
This patient returns to my office for at risk foot care.  This patient requires this care by a professional since this patient will be at risk due to having DM, renal disease and PVD. This patient is unable to cut nails himself since the patient cannot reach his nails.These nails are painful walking and wearing shoes.  He presents to the office with her son. This patient presents for at risk foot care today.  General Appearance  Alert, conversant and in no acute stress.  Vascular  Dorsalis pedis and posterior tibial  pulses are  weakly palpable  bilaterally.  Capillary return is within normal limits  bilaterally. Temperature is within normal limits  bilaterally.  Neurologic  Senn-Weinstein monofilament wire test within normal limits  bilaterally. Muscle power within normal limits bilaterally.  Nails Thick disfigured discolored nails with subungual debris  from hallux to fifth toes bilaterally. No evidence of bacterial infection or drainage bilaterally.  Orthopedic  No limitations of motion  feet .  No crepitus or effusions noted.  No bony pathology or digital deformities noted.  Skin  normotropic skin with no porokeratosis noted bilaterally.  No signs of infections or ulcers noted.     Onychomycosis  Pain in right toes  Pain in left toes  Consent was obtained for treatment procedures.   Mechanical debridement of nails 1-5  bilaterally performed with a nail nipper.  Filed with dremel without incident.    Return office visit  10 weeks                Told patient to return for periodic foot care and evaluation due to potential at risk complications.   Helane Gunther DPM

## 2023-06-30 ENCOUNTER — Encounter: Payer: Self-pay | Admitting: Podiatry

## 2023-06-30 ENCOUNTER — Ambulatory Visit: Payer: Medicare Other | Admitting: Podiatry

## 2023-06-30 DIAGNOSIS — M79609 Pain in unspecified limb: Secondary | ICD-10-CM

## 2023-06-30 DIAGNOSIS — E1142 Type 2 diabetes mellitus with diabetic polyneuropathy: Secondary | ICD-10-CM

## 2023-06-30 DIAGNOSIS — B351 Tinea unguium: Secondary | ICD-10-CM

## 2023-06-30 DIAGNOSIS — I739 Peripheral vascular disease, unspecified: Secondary | ICD-10-CM

## 2023-06-30 NOTE — Progress Notes (Signed)
 This patient returns to my office for at risk foot care.  This patient requires this care by a professional since this patient will be at risk due to having DM, renal disease and PVD. This patient is unable to cut nails himself since the patient cannot reach his nails.These nails are painful walking and wearing shoes.  He presents to the office with her son. This patient presents for at risk foot care today.  General Appearance  Alert, conversant and in no acute stress.  Vascular  Dorsalis pedis and posterior tibial  pulses are  weakly palpable  bilaterally.  Capillary return is within normal limits  bilaterally. Temperature is within normal limits  bilaterally.  Neurologic  Senn-Weinstein monofilament wire test within normal limits  bilaterally. Muscle power within normal limits bilaterally.  Nails Thick disfigured discolored nails with subungual debris  from hallux to fifth toes bilaterally. No evidence of bacterial infection or drainage bilaterally.  Orthopedic  No limitations of motion  feet .  No crepitus or effusions noted.  No bony pathology or digital deformities noted.  Skin  normotropic skin with no porokeratosis noted bilaterally.  No signs of infections or ulcers noted.     Onychomycosis  Pain in right toes  Pain in left toes  Consent was obtained for treatment procedures.   Mechanical debridement of nails 1-5  bilaterally performed with a nail nipper.  Filed with dremel without incident.    Return office visit  10 weeks                Told patient to return for periodic foot care and evaluation due to potential at risk complications.   Helane Gunther DPM

## 2023-06-30 NOTE — Progress Notes (Signed)
 Marland Kitchen

## 2023-07-26 ENCOUNTER — Other Ambulatory Visit: Payer: Self-pay

## 2023-07-26 ENCOUNTER — Observation Stay (HOSPITAL_COMMUNITY)
Admission: EM | Admit: 2023-07-26 | Discharge: 2023-07-27 | Disposition: A | Discharge status: Home / Self Care | Payer: Medicare Other | Attending: Internal Medicine | Admitting: Internal Medicine

## 2023-07-26 ENCOUNTER — Emergency Department (HOSPITAL_COMMUNITY): Payer: Medicare Other

## 2023-07-26 ENCOUNTER — Encounter (HOSPITAL_COMMUNITY): Payer: Self-pay

## 2023-07-26 DIAGNOSIS — Z7982 Long term (current) use of aspirin: Secondary | ICD-10-CM | POA: Insufficient documentation

## 2023-07-26 DIAGNOSIS — N1832 Chronic kidney disease, stage 3b: Secondary | ICD-10-CM | POA: Insufficient documentation

## 2023-07-26 DIAGNOSIS — E1122 Type 2 diabetes mellitus with diabetic chronic kidney disease: Secondary | ICD-10-CM | POA: Diagnosis not present

## 2023-07-26 DIAGNOSIS — N179 Acute kidney failure, unspecified: Secondary | ICD-10-CM | POA: Insufficient documentation

## 2023-07-26 DIAGNOSIS — G9341 Metabolic encephalopathy: Secondary | ICD-10-CM | POA: Diagnosis not present

## 2023-07-26 DIAGNOSIS — Z8673 Personal history of transient ischemic attack (TIA), and cerebral infarction without residual deficits: Secondary | ICD-10-CM | POA: Insufficient documentation

## 2023-07-26 DIAGNOSIS — Z79899 Other long term (current) drug therapy: Secondary | ICD-10-CM | POA: Diagnosis not present

## 2023-07-26 DIAGNOSIS — F0390 Unspecified dementia without behavioral disturbance: Secondary | ICD-10-CM | POA: Insufficient documentation

## 2023-07-26 DIAGNOSIS — E785 Hyperlipidemia, unspecified: Secondary | ICD-10-CM | POA: Insufficient documentation

## 2023-07-26 DIAGNOSIS — Z1152 Encounter for screening for COVID-19: Secondary | ICD-10-CM | POA: Diagnosis not present

## 2023-07-26 DIAGNOSIS — R262 Difficulty in walking, not elsewhere classified: Secondary | ICD-10-CM | POA: Insufficient documentation

## 2023-07-26 DIAGNOSIS — D631 Anemia in chronic kidney disease: Secondary | ICD-10-CM | POA: Insufficient documentation

## 2023-07-26 DIAGNOSIS — I674 Hypertensive encephalopathy: Principal | ICD-10-CM

## 2023-07-26 DIAGNOSIS — R2689 Other abnormalities of gait and mobility: Secondary | ICD-10-CM | POA: Diagnosis not present

## 2023-07-26 DIAGNOSIS — I129 Hypertensive chronic kidney disease with stage 1 through stage 4 chronic kidney disease, or unspecified chronic kidney disease: Secondary | ICD-10-CM | POA: Diagnosis not present

## 2023-07-26 DIAGNOSIS — R4182 Altered mental status, unspecified: Secondary | ICD-10-CM | POA: Diagnosis not present

## 2023-07-26 DIAGNOSIS — D500 Iron deficiency anemia secondary to blood loss (chronic): Secondary | ICD-10-CM | POA: Diagnosis not present

## 2023-07-26 DIAGNOSIS — R471 Dysarthria and anarthria: Secondary | ICD-10-CM | POA: Insufficient documentation

## 2023-07-26 DIAGNOSIS — M6281 Muscle weakness (generalized): Secondary | ICD-10-CM | POA: Diagnosis not present

## 2023-07-26 DIAGNOSIS — I161 Hypertensive emergency: Principal | ICD-10-CM | POA: Insufficient documentation

## 2023-07-26 DIAGNOSIS — N401 Enlarged prostate with lower urinary tract symptoms: Secondary | ICD-10-CM | POA: Diagnosis not present

## 2023-07-26 DIAGNOSIS — R339 Retention of urine, unspecified: Secondary | ICD-10-CM | POA: Insufficient documentation

## 2023-07-26 DIAGNOSIS — G9340 Encephalopathy, unspecified: Secondary | ICD-10-CM | POA: Insufficient documentation

## 2023-07-26 DIAGNOSIS — Z87891 Personal history of nicotine dependence: Secondary | ICD-10-CM | POA: Insufficient documentation

## 2023-07-26 DIAGNOSIS — R350 Frequency of micturition: Secondary | ICD-10-CM | POA: Diagnosis present

## 2023-07-26 LAB — URINALYSIS, W/ REFLEX TO CULTURE (INFECTION SUSPECTED)
Bilirubin Urine: NEGATIVE
Glucose, UA: NEGATIVE mg/dL
Ketones, ur: NEGATIVE mg/dL
Leukocytes,Ua: NEGATIVE
Nitrite: NEGATIVE
Protein, ur: NEGATIVE mg/dL
Specific Gravity, Urine: 1.005 (ref 1.005–1.030)
pH: 5 (ref 5.0–8.0)

## 2023-07-26 LAB — COMPREHENSIVE METABOLIC PANEL
ALT: 15 U/L (ref 0–44)
AST: 25 U/L (ref 15–41)
Albumin: 3.6 g/dL (ref 3.5–5.0)
Alkaline Phosphatase: 92 U/L (ref 38–126)
Anion gap: 12 (ref 5–15)
BUN: 35 mg/dL — ABNORMAL HIGH (ref 8–23)
CO2: 21 mmol/L — ABNORMAL LOW (ref 22–32)
Calcium: 9 mg/dL (ref 8.9–10.3)
Chloride: 103 mmol/L (ref 98–111)
Creatinine, Ser: 1.71 mg/dL — ABNORMAL HIGH (ref 0.61–1.24)
GFR, Estimated: 36 mL/min — ABNORMAL LOW (ref >60)
Glucose, Bld: 135 mg/dL — ABNORMAL HIGH (ref 70–99)
Potassium: 4.2 mmol/L (ref 3.5–5.1)
Sodium: 136 mmol/L (ref 135–145)
Total Bilirubin: 1 mg/dL (ref 0.0–1.2)
Total Protein: 7.4 g/dL (ref 6.5–8.1)

## 2023-07-26 LAB — CBC WITH DIFFERENTIAL/PLATELET
Abs Immature Granulocytes: 0.05 10*3/uL (ref 0.00–0.07)
Basophils Absolute: 0.1 10*3/uL (ref 0.0–0.1)
Basophils Relative: 1 %
Eosinophils Absolute: 0.6 10*3/uL — ABNORMAL HIGH (ref 0.0–0.5)
Eosinophils Relative: 8 %
HCT: 36.9 % — ABNORMAL LOW (ref 39.0–52.0)
Hemoglobin: 12.4 g/dL — ABNORMAL LOW (ref 13.0–17.0)
Immature Granulocytes: 1 %
Lymphocytes Relative: 26 %
Lymphs Abs: 2 10*3/uL (ref 0.7–4.0)
MCH: 31.6 pg (ref 26.0–34.0)
MCHC: 33.6 g/dL (ref 30.0–36.0)
MCV: 93.9 fL (ref 80.0–100.0)
Monocytes Absolute: 0.6 10*3/uL (ref 0.1–1.0)
Monocytes Relative: 8 %
Neutro Abs: 4.5 10*3/uL (ref 1.7–7.7)
Neutrophils Relative %: 56 %
Platelets: 252 10*3/uL (ref 150–400)
RBC: 3.93 MIL/uL — ABNORMAL LOW (ref 4.22–5.81)
RDW: 13 % (ref 11.5–15.5)
WBC: 7.9 10*3/uL (ref 4.0–10.5)
nRBC: 0 % (ref 0.0–0.2)

## 2023-07-26 LAB — CBC
HCT: 37.9 % — ABNORMAL LOW (ref 39.0–52.0)
Hemoglobin: 12.8 g/dL — ABNORMAL LOW (ref 13.0–17.0)
MCH: 31.4 pg (ref 26.0–34.0)
MCHC: 33.8 g/dL (ref 30.0–36.0)
MCV: 92.9 fL (ref 80.0–100.0)
Platelets: 232 10*3/uL (ref 150–400)
RBC: 4.08 MIL/uL — ABNORMAL LOW (ref 4.22–5.81)
RDW: 13 % (ref 11.5–15.5)
WBC: 10.9 10*3/uL — ABNORMAL HIGH (ref 4.0–10.5)
nRBC: 0 % (ref 0.0–0.2)

## 2023-07-26 LAB — CREATININE, SERUM
Creatinine, Ser: 1.54 mg/dL — ABNORMAL HIGH (ref 0.61–1.24)
GFR, Estimated: 41 mL/min — ABNORMAL LOW (ref >60)

## 2023-07-26 LAB — RESP PANEL BY RT-PCR (RSV, FLU A&B, COVID)  RVPGX2
Influenza A by PCR: NEGATIVE
Influenza B by PCR: NEGATIVE
Resp Syncytial Virus by PCR: NEGATIVE
SARS Coronavirus 2 by RT PCR: NEGATIVE

## 2023-07-26 LAB — CBG MONITORING, ED: Glucose-Capillary: 128 mg/dL — ABNORMAL HIGH (ref 70–99)

## 2023-07-26 MED ORDER — HYDRALAZINE HCL 20 MG/ML IJ SOLN
10.0000 mg | Freq: Once | INTRAMUSCULAR | Status: AC
  Administered 2023-07-26: 10 mg via INTRAVENOUS
  Filled 2023-07-26: qty 1

## 2023-07-26 MED ORDER — HYDRALAZINE HCL 20 MG/ML IJ SOLN
10.0000 mg | Freq: Four times a day (QID) | INTRAMUSCULAR | Status: DC | PRN
  Filled 2023-07-26: qty 1

## 2023-07-26 MED ORDER — ACETAMINOPHEN 650 MG RE SUPP: 650.0000 mg | Freq: Four times a day (QID) | RECTAL | Status: DC | PRN

## 2023-07-26 MED ORDER — ACETAMINOPHEN 325 MG PO TABS: 650.0000 mg | ORAL_TABLET | Freq: Four times a day (QID) | ORAL | Status: DC | PRN

## 2023-07-26 MED ORDER — ENOXAPARIN SODIUM 30 MG/0.3ML IJ SOSY
30.0000 mg | PREFILLED_SYRINGE | Freq: Every day | INTRAMUSCULAR | Status: DC
  Administered 2023-07-26: 30 mg via SUBCUTANEOUS
  Filled 2023-07-26: qty 0.3

## 2023-07-26 MED ORDER — ONDANSETRON HCL 4 MG/2ML IJ SOLN: 4.0000 mg | Freq: Four times a day (QID) | INTRAMUSCULAR | Status: DC | PRN

## 2023-07-26 MED ORDER — ONDANSETRON HCL 4 MG PO TABS: 4.0000 mg | ORAL_TABLET | Freq: Four times a day (QID) | ORAL | Status: DC | PRN

## 2023-07-26 MED ORDER — CARVEDILOL 3.125 MG PO TABS
3.1250 mg | ORAL_TABLET | Freq: Two times a day (BID) | ORAL | Status: DC
  Administered 2023-07-26 – 2023-07-27 (×2): 3.125 mg via ORAL
  Filled 2023-07-26 (×2): qty 1

## 2023-07-26 MED ORDER — TAMSULOSIN HCL 0.4 MG PO CAPS: 0.4000 mg | ORAL_CAPSULE | Freq: Every day | ORAL | Status: DC

## 2023-07-26 MED ORDER — ENOXAPARIN SODIUM 30 MG/0.3ML IJ SOSY: 30.0000 mg | PREFILLED_SYRINGE | INTRAMUSCULAR | Status: DC

## 2023-07-26 NOTE — ED Notes (Signed)
 PA speaking with pt in triage

## 2023-07-26 NOTE — ED Provider Triage Note (Signed)
 Emergency Medicine Provider Triage Evaluation Note  CASEN PRYOR , a 89 y.o. male  was evaluated in triage.  Pt complains of dizziness.  Review of Systems  Positive:  Negative:   Physical Exam  BP 120/72 (BP Location: Left Arm)   Pulse 99   Temp 97.8 F (36.6 C) (Oral)   Resp 14   Ht 6' (1.829 m)   Wt 62.3 kg   SpO2 100%   BMI 18.63 kg/m  Gen:   Awake, no distress   Resp:  Normal effort  MSK:   Moves extremities without difficulty  Other:    Medical Decision Making  Medically screening exam initiated at 1:18 PM.  Appropriate orders placed.  Teyton Pattillo Swamy was informed that the remainder of the evaluation will be completed by another provider, this initial triage assessment does not replace that evaluation, and the importance of remaining in the ED until their evaluation is complete.  Has been dizzy and off balance x3 days. Also with urinary frequency and some diarrhea recently.   Hoy Fraction F, [place] 07/26/23 [phone]

## 2023-07-26 NOTE — H&P (Signed)
 History and Physical    Aaron Landry FMW:[id number] DOB: 1924-08-14 DOA: 07/26/2023  PCP: Shepard Ade, MD   Chief Complaint:  urinary freq  HPI: Aaron Landry is a 89 y.o. male with medical history significant of BPH, CKD, type 2 diabetes, hypertension, hyperlipidemia, prior TIA who presents emergency department with confusion.  Patient was previously admitted for E. coli bacteremia secondary to UTI.  Family noted him to be more aggressive and altered.  He was agitated not acting his usual self.  He was brought to the ER he was found to be afebrile hemodynamically stable.  Blood pressure shows systolics greater than 180.  Labs were obtained which demonstrated WBC 7.9, hemoglobin 12.4, creatinine 1.7, bicarb 21.  CT head showed no acute findings.  Chest x-ray demonstrated no acute airspace opacities.  CT abdomen showed diverticulosis and chronic changes.  Patient was diagnosed with hypertensive encephalopathy admitted for further workup. On evaluation he was resting comfortably.  Family remained at bedside and state he was still altered.  Review of Systems: Review of Systems  Constitutional:  Negative for chills and fever.  HENT: Negative.    Eyes: Negative.   Respiratory: Negative.    Cardiovascular: Negative.   Gastrointestinal: Negative.   Genitourinary: Negative.   Musculoskeletal: Negative.   Skin: Negative.   Neurological: Negative.   Endo/Heme/Allergies: Negative.   Psychiatric/Behavioral:  Positive for hallucinations.      As per HPI otherwise 10 point review of systems negative.   Allergies  Allergen Reactions   Codeine Itching, Rash and Other (See Comments)    General discomfort, too   Penicillins Itching and Rash    Did it involve swelling of the face/tongue/throat, SOB, or low BP? No Did it involve sudden or severe rash/hives, skin peeling, or any reaction on the inside of your mouth or nose? Yes Did you need to seek medical attention at a hospital or doctor's  office? No When did it last happen?   Decades Ago    If all above answers are NO, may proceed with cephalosporin use.      Past Medical History:  Diagnosis Date   BPH (benign prostatic hyperplasia)    Chronic kidney disease    Diabetes mellitus    Hyperlipidemia    Hypertension    OA (ocular albinism) (HCC)    Pleural effusion    Renal insufficiency    TIA (transient ischemic attack)     Past Surgical History:  Procedure Laterality Date   COLON SURGERY  2011   HERNIA REPAIR     LIVER BIOPSY       reports that he quit smoking about 42 years ago. His smoking use included cigarettes. He has never used smokeless tobacco. He reports that he does not drink alcohol and does not use drugs.  Family History  Problem Relation Age of Onset   Alzheimer's disease Mother    Coronary artery disease Father    Diabetes Father    Heart attack Father     Prior to Admission medications   Medication Sig Start Date End Date Taking? Authorizing Provider  Aspirin -Salicylamide-Caffeine (BC HEADACHE PO) Take 1 packet by mouth 2 (two) times daily as needed (pain).   Yes [provider]  cefdinir  (OMNICEF ) 300 MG capsule Take 1 capsule (300 mg total) by mouth every 12 (twelve) hours. Patient not taking: Reported on 07/26/2023 12/22/21   Milissa Tod PARAS, MD  melatonin 5 MG TABS Take 1 tablet (5 mg total) by mouth at bedtime  as needed. Patient not taking: Reported on 07/26/2023 12/22/21   Milissa Tod PARAS, MD  ondansetron  (ZOFRAN -ODT) 4 MG disintegrating tablet Take 1 tablet (4 mg total) by mouth every 8 (eight) hours as needed for nausea. Patient not taking: Reported on 07/26/2023 12/15/21   Jarold Olam HERO, PA-C  polyethylene glycol (MIRALAX  / GLYCOLAX ) 17 g packet Take 17 g by mouth daily as needed for mild constipation. Patient not taking: Reported on 07/26/2023 06/05/20   Pokhrel, Laxman, MD  tamsulosin  (FLOMAX ) 0.4 MG CAPS capsule Take 1 capsule (0.4 mg total) by mouth daily. Patient not taking:  Reported on 07/26/2023 06/06/20   Pokhrel, Laxman, MD  traMADol  (ULTRAM ) 50 MG tablet Take 1 tablet (50 mg total) by mouth every 6 (six) hours as needed for moderate pain. Patient not taking: Reported on 07/26/2023 08/25/14   Vann, Jessica U, DO    Physical Exam: Vitals:   07/26/23 1317 07/26/23 1533 07/26/23 1716 07/26/23 1739  BP:  (!) 181/94 (!) 174/93 (!) 200/105  Pulse:  68 77   Resp:  15 16   Temp:  (!) 97.5 F (36.4 C)    TempSrc:      SpO2:  99% 99%   Weight: 62.3 kg     Height: 6' (1.829 m)      Physical Exam Constitutional:      Appearance: He is normal weight.  HENT:     Head: Normocephalic.     Nose: Nose normal.     Mouth/Throat:     Mouth: Mucous membranes are moist.     Pharynx: Oropharynx is clear.  Eyes:     Conjunctiva/sclera: Conjunctivae normal.     Pupils: Pupils are equal, round, and reactive to light.  Cardiovascular:     Rate and Rhythm: Normal rate and regular rhythm.     Pulses: Normal pulses.  Pulmonary:     Effort: Pulmonary effort is normal.     Breath sounds: Normal breath sounds.  Abdominal:     General: Abdomen is flat. Bowel sounds are normal.  Musculoskeletal:        General: Normal range of motion.     Cervical back: Normal range of motion.  Skin:    General: Skin is warm.     Capillary Refill: Capillary refill takes less than 2 seconds.  Neurological:     General: No focal deficit present.     Mental Status: He is alert.  Psychiatric:        Mood and Affect: Mood normal.       Labs on Admission: I have personally reviewed the patients's labs and imaging studies.  Assessment/Plan Principal Problem:   Hypertensive emergency   # Hypertensive encephalopathy - Patient blood pressures greater than 180 in setting of altered mental status - Urinalysis negative for infection - Patient currently not on antihypertensives  Plan: As needed hydralazine  for blood pressures greater than 180 Start Coreg  3.125 mg twice daily  #BPH-  continue flomax   #CKD3b- trend Cr  #Chronic anemia- trend cbc    Admission status: Observation Telemetry  Certification: The appropriate patient status for this patient is OBSERVATION. Observation status is judged to be reasonable and necessary in order to provide the required intensity of service to ensure the patient's safety. The patient's presenting symptoms, physical exam findings, and initial radiographic and laboratory data in the context of their medical condition is felt to place them at decreased risk for further clinical deterioration. Furthermore, it is anticipated that the patient will be  medically stable for discharge from the hospital within 2 midnights of admission.     Lamar Dess MD [facility] If 7PM-7AM, please contact night-coverage [email or web address]  07/26/2023, 6:13 PM

## 2023-07-26 NOTE — ED Provider Notes (Signed)
 [facility]ERGENCY DEPARTMENT AT [facility] Provider Note   CSN: [id number] Arrival date & time: 07/26/23  1236     History  Chief Complaint  Patient presents with   Urinary Frequency    Aaron Landry is a 89 y.o. male history of E. coli bacteremia from UTI, dementia here presenting with aggressive behavior and urinary frequency.  Patient lives at home with his son.  Since yesterday he has been very confused very suddenly.  He started cussing and started hallucinating.  He thought he was somewhere else and became very agitated and confused.  Patient also has urinary frequency.  Patient was admitted 2 years ago for UTI and E. coli bacteremia.  Patient unable to give history and history per son.  The history is provided by the patient and a relative.       Home Medications Prior to Admission medications   Medication Sig Start Date End Date Taking? Authorizing Provider  Aspirin -Salicylamide-Caffeine (BC HEADACHE PO) Take 1 packet by mouth 2 (two) times daily as needed (pain).    [provider]  cefdinir  (OMNICEF ) 300 MG capsule Take 1 capsule (300 mg total) by mouth every 12 (twelve) hours. 12/22/21   Milissa Tod PARAS, MD  melatonin 5 MG TABS Take 1 tablet (5 mg total) by mouth at bedtime as needed. 12/22/21   Milissa Tod PARAS, MD  ondansetron  (ZOFRAN -ODT) 4 MG disintegrating tablet Take 1 tablet (4 mg total) by mouth every 8 (eight) hours as needed for nausea. 12/15/21   Jarold Olam HERO, PA-C  polyethylene glycol (MIRALAX  / GLYCOLAX ) 17 g packet Take 17 g by mouth daily as needed for mild constipation. 06/05/20   Pokhrel, Laxman, MD  tamsulosin  (FLOMAX ) 0.4 MG CAPS capsule Take 1 capsule (0.4 mg total) by mouth daily. 06/06/20   Pokhrel, Laxman, MD  traMADol  (ULTRAM ) 50 MG tablet Take 1 tablet (50 mg total) by mouth every 6 (six) hours as needed for moderate pain. 08/25/14   Vann, Jessica U, DO      Allergies    Codeine and Penicillins    Review of Systems   Review  of Systems  Genitourinary:  Positive for frequency.  All other systems reviewed and are negative.   Physical Exam Updated Vital Signs BP (!) 181/94 (BP Location: Right Arm)   Pulse 68   Temp (!) 97.5 F (36.4 C)   Resp 15   Ht 6' (1.829 m)   Wt 62.3 kg   SpO2 99%   BMI 18.63 kg/m  Physical Exam Vitals and nursing note reviewed.  Constitutional:      Comments: Confused and agitated   HENT:     Head: Normocephalic.     Nose: Nose normal.     Mouth/Throat:     Mouth: Mucous membranes are moist.  Eyes:     Extraocular Movements: Extraocular movements intact.     Pupils: Pupils are equal, round, and reactive to light.  Cardiovascular:     Rate and Rhythm: Normal rate and regular rhythm.     Pulses: Normal pulses.     Heart sounds: Normal heart sounds.  Pulmonary:     Effort: Pulmonary effort is normal.     Breath sounds: Normal breath sounds.  Abdominal:     General: Abdomen is flat.     Palpations: Abdomen is soft.  Musculoskeletal:        General: Normal range of motion.     Cervical back: Normal range of motion and neck  supple.  Skin:    General: Skin is warm.     Capillary Refill: Capillary refill takes less than 2 seconds.  Neurological:     Comments: Confused but moving all extremities.  Difficulty following commands  Psychiatric:        Mood and Affect: Mood normal.     ED Results / Procedures / Treatments   Labs (all labs ordered are listed, but only abnormal results are displayed) Labs Reviewed  COMPREHENSIVE METABOLIC PANEL - Abnormal; Notable for the following components:      Result Value   CO2 21 (*)    Glucose, Bld 135 (*)    BUN 35 (*)    Creatinine, Ser 1.71 (*)    GFR, Estimated 36 (*)    All other components within normal limits  CBC WITH DIFFERENTIAL/PLATELET - Abnormal; Notable for the following components:   RBC 3.93 (*)    Hemoglobin 12.4 (*)    HCT 36.9 (*)    Eosinophils Absolute 0.6 (*)    All other components within normal  limits  URINALYSIS, W/ REFLEX TO CULTURE (INFECTION SUSPECTED) - Abnormal; Notable for the following components:   Color, Urine STRAW (*)    Hgb urine dipstick SMALL (*)    Bacteria, UA RARE (*)    All other components within normal limits  CBG MONITORING, ED - Abnormal; Notable for the following components:   Glucose-Capillary 128 (*)    All other components within normal limits  RESP PANEL BY RT-PCR (RSV, FLU A&B, COVID)  [id number]  URINE CULTURE    EKG EKG Interpretation Date/Time:  Sunday July 26 2023 15:30:26 EST Ventricular Rate:  69 PR Interval:  164 QRS Duration:  122 QT Interval:  428 QTC Calculation: 459 R Axis:   -41  Text Interpretation: Sinus rhythm RBBB and LAFB No significant change since last tracing Confirmed by Patt Alm DEL [phone]) on 07/26/2023 3:52:50 PM  Radiology DG Chest Port 1 View Result Date: 07/26/2023 CLINICAL DATA:  Altered mental status EXAM: PORTABLE CHEST 1 VIEW COMPARISON:  12/16/2021 FINDINGS: The heart size and mediastinal contours are within normal limits. Bandlike scarring of the right lung base. No acute airspace opacity. Large calcified loose bodies in the glenohumeral joints. IMPRESSION: Bandlike scarring of the right lung base. No acute airspace opacity. Electronically Signed   By: Marolyn JONETTA Jaksch M.D.   On: 07/26/2023 16:33   CT Head Wo Contrast Result Date: 07/26/2023 CLINICAL DATA:  Mental status change, urinary frequency for 4 days, intermittent confusion with aggression EXAM: CT HEAD WITHOUT CONTRAST TECHNIQUE: Contiguous axial images were obtained from the base of the skull through the vertex without intravenous contrast. RADIATION DOSE REDUCTION: This exam was performed according to the departmental dose-optimization program which includes automated exposure control, adjustment of the mA and/or kV according to patient size and/or use of iterative reconstruction technique. COMPARISON:  09/08/2020 FINDINGS: Brain: No intracranial hemorrhage, mass  effect, or evidence of acute infarct. No hydrocephalus. No extra-axial fluid collection. Age-commensurate cerebral atrophy and chronic small vessel ischemic disease. Chronic bilateral basal ganglia infarcts. Vascular: No hyperdense vessel. Intracranial arterial calcification. Skull: No fracture or focal lesion. Sinuses/Orbits: No acute finding. Other: None. IMPRESSION: No acute intracranial abnormality. Electronically Signed   By: Norman Gatlin M.D.   On: 07/26/2023 14:09    Procedures Procedures    Medications Ordered in ED Medications  hydrALAZINE  (APRESOLINE ) injection 10 mg (has no administration in time range)    ED Course/ Medical Decision Making/ A&P  Medical Decision Making Aaron Landry is a 89 y.o. male here presenting with back pain and dysuria and confusion.  Differential is broad.  Patient is hypertensive and this is new for him.  Consider hypertensive encephalopathy versus UTI versus pyelonephritis versus COVID or flu or RSV.  Plan to get CT head and CT abdomen pelvis and CBC and CMP and UA and COVID and flu and RSV  5:49 PM I reviewed patient's labs and creatinine is baseline at 1.7.  CT head and CT abdomen pelvis unremarkable.  Patient is hypertensive however with blood pressure over 200.  UA showed rare bacteria but no obvious UTI.  Urine culture is sent and hold off antibiotics.  At this point, will admit for hypertensive encephalopathy.  Problems Addressed: Hypertensive encephalopathy: acute illness or injury  Amount and/or Complexity of Data Reviewed Labs: ordered. Decision-making details documented in ED Course. Radiology: ordered and independent interpretation performed. Decision-making details documented in ED Course.  Risk Prescription drug management. Decision regarding hospitalization.    Final Clinical Impression(s) / ED Diagnoses Final diagnoses:  None    Rx / DC Orders ED Discharge Orders     None          Patt Alm Macho, MD 07/26/23 1751

## 2023-07-26 NOTE — ED Triage Notes (Signed)
 Urinary frequency for 4days, intermittent confusion with aggression per the son. Pt alert and orientedx4 on arrival.

## 2023-07-27 ENCOUNTER — Observation Stay (HOSPITAL_BASED_OUTPATIENT_CLINIC_OR_DEPARTMENT_OTHER)
Admission: EM | Admit: 2023-07-27 | Discharge: 2023-07-31 | Disposition: A | Discharge status: Home / Self Care | Payer: Medicare Other | Source: Home / Self Care | Attending: Emergency Medicine | Admitting: Emergency Medicine

## 2023-07-27 ENCOUNTER — Telehealth (HOSPITAL_COMMUNITY): Payer: Self-pay | Admitting: *Deleted

## 2023-07-27 DIAGNOSIS — Z79899 Other long term (current) drug therapy: Secondary | ICD-10-CM | POA: Insufficient documentation

## 2023-07-27 DIAGNOSIS — N1832 Chronic kidney disease, stage 3b: Secondary | ICD-10-CM | POA: Insufficient documentation

## 2023-07-27 DIAGNOSIS — Z87891 Personal history of nicotine dependence: Secondary | ICD-10-CM | POA: Insufficient documentation

## 2023-07-27 DIAGNOSIS — E119 Type 2 diabetes mellitus without complications: Secondary | ICD-10-CM

## 2023-07-27 DIAGNOSIS — R531 Weakness: Secondary | ICD-10-CM | POA: Insufficient documentation

## 2023-07-27 DIAGNOSIS — N179 Acute kidney failure, unspecified: Secondary | ICD-10-CM | POA: Insufficient documentation

## 2023-07-27 DIAGNOSIS — Z8673 Personal history of transient ischemic attack (TIA), and cerebral infarction without residual deficits: Secondary | ICD-10-CM | POA: Insufficient documentation

## 2023-07-27 DIAGNOSIS — D500 Iron deficiency anemia secondary to blood loss (chronic): Secondary | ICD-10-CM | POA: Insufficient documentation

## 2023-07-27 DIAGNOSIS — I161 Hypertensive emergency: Secondary | ICD-10-CM | POA: Diagnosis not present

## 2023-07-27 DIAGNOSIS — R4182 Altered mental status, unspecified: Principal | ICD-10-CM

## 2023-07-27 DIAGNOSIS — R471 Dysarthria and anarthria: Secondary | ICD-10-CM | POA: Insufficient documentation

## 2023-07-27 DIAGNOSIS — I129 Hypertensive chronic kidney disease with stage 1 through stage 4 chronic kidney disease, or unspecified chronic kidney disease: Secondary | ICD-10-CM | POA: Insufficient documentation

## 2023-07-27 DIAGNOSIS — G9341 Metabolic encephalopathy: Secondary | ICD-10-CM | POA: Insufficient documentation

## 2023-07-27 DIAGNOSIS — Z7982 Long term (current) use of aspirin: Secondary | ICD-10-CM | POA: Insufficient documentation

## 2023-07-27 DIAGNOSIS — N189 Chronic kidney disease, unspecified: Secondary | ICD-10-CM | POA: Diagnosis present

## 2023-07-27 DIAGNOSIS — E1122 Type 2 diabetes mellitus with diabetic chronic kidney disease: Secondary | ICD-10-CM | POA: Insufficient documentation

## 2023-07-27 DIAGNOSIS — I10 Essential (primary) hypertension: Secondary | ICD-10-CM | POA: Diagnosis present

## 2023-07-27 DIAGNOSIS — R339 Retention of urine, unspecified: Secondary | ICD-10-CM | POA: Insufficient documentation

## 2023-07-27 DIAGNOSIS — E785 Hyperlipidemia, unspecified: Secondary | ICD-10-CM | POA: Insufficient documentation

## 2023-07-27 LAB — BLOOD CULTURE ID PANEL (REFLEXED) - BCID2

## 2023-07-27 LAB — BASIC METABOLIC PANEL
Anion gap: 11 (ref 5–15)
BUN: 34 mg/dL — ABNORMAL HIGH (ref 8–23)
CO2: 21 mmol/L — ABNORMAL LOW (ref 22–32)
Calcium: 8.7 mg/dL — ABNORMAL LOW (ref 8.9–10.3)
Chloride: 102 mmol/L (ref 98–111)
Creatinine, Ser: 1.58 mg/dL — ABNORMAL HIGH (ref 0.61–1.24)
GFR, Estimated: 40 mL/min — ABNORMAL LOW (ref >60)
Glucose, Bld: 110 mg/dL — ABNORMAL HIGH (ref 70–99)
Potassium: 4.2 mmol/L (ref 3.5–5.1)
Sodium: 134 mmol/L — ABNORMAL LOW (ref 135–145)

## 2023-07-27 LAB — CBC
HCT: 35.9 % — ABNORMAL LOW (ref 39.0–52.0)
Hemoglobin: 11.8 g/dL — ABNORMAL LOW (ref 13.0–17.0)
MCH: 30.4 pg (ref 26.0–34.0)
MCHC: 32.9 g/dL (ref 30.0–36.0)
MCV: 92.5 fL (ref 80.0–100.0)
Platelets: 238 10*3/uL (ref 150–400)
RBC: 3.88 MIL/uL — ABNORMAL LOW (ref 4.22–5.81)
RDW: 13.2 % (ref 11.5–15.5)
WBC: 7.4 10*3/uL (ref 4.0–10.5)
nRBC: 0 % (ref 0.0–0.2)

## 2023-07-27 LAB — URINE CULTURE: Culture: NO GROWTH

## 2023-07-27 MED ORDER — CARVEDILOL 3.125 MG PO TABS: 3.1250 mg | ORAL_TABLET | Freq: Two times a day (BID) | ORAL | 0 refills | Status: AC

## 2023-07-27 NOTE — ED Notes (Signed)
 Pt voided using a urinal, urine is at bedside.

## 2023-07-27 NOTE — Telephone Encounter (Signed)
 Contacted pt's son after receiving phone call for +blood cultures. Per son, pt has been sleeping most of the day and not feeling well since being discharged. Per recommendations from Dr.Haviland, if pt is feeling poorly to return for IV antibiotics. Son to pack overnight bag and wake pt and will be returning

## 2023-07-27 NOTE — ED Notes (Signed)
 RN to bedside for rounding. Pt sitting on foot of bed, unhooked from leads, bottoms off, and urine on floor. Pt stated they had to go to bathroom. Cathryn H, Paramedic waited at bedside with pt while this RN got the sara steady to transfer pt to bathroom. Pt able to follow instructions and tolerated trip well. Pt cleaned and clothing changed. Pt bedding changed. Pt educated on how to use call bell in which he stated understanding. Room in front of nurses station with door open. High fall risk sign in place and bed rails up x2.

## 2023-07-27 NOTE — ED Notes (Signed)
 Assumed care of patient. Patient resting comfortably in bed with no signs of acute distress noted. Waiting on ready hospital bed.

## 2023-07-27 NOTE — Care Management CC44 (Signed)
 Condition Code 44 Documentation Completed  Patient Details  Name: Aaron Landry MRN: [id number] Date of Birth: 09/24/1924   Condition Code 44 given:  Yes Patient signature on Condition Code 44 notice:  Yes Documentation of 2 MD's agreement:  Yes Code 44 added to claim:  Yes    Jabier Martens, LCSW 07/27/2023, 11:00 AM

## 2023-07-27 NOTE — ED Triage Notes (Signed)
 Patient arrived with family who states patient was seen yesterday for confusion, weakness, and possible UTI. Today called to return to department due to positive blood cultures.

## 2023-07-27 NOTE — Discharge Summary (Signed)
 Physician Discharge Summary   Patient: Aaron Landry MRN: [id number] DOB: 1924/11/04  Admit date:     07/26/2023  Discharge date: 07/27/23  Discharge Physician: Jodeane Mulligan   PCP: Suan Elm, MD   Recommendations at discharge:   At this time patient will be discharged home.  If you experience any symptoms such as fever, vomiting, shortness of breath, chest pain, abdominal pain, or other concerning symptoms, please call your primary care provider or go to the emergency department immediately.  Discharge Diagnoses: Principal Problem:   Hypertensive emergency  Resolved Problems:   * No resolved hospital problems. *  Hospital Course:   Aaron Landry is a 89 y.o. male with medical history significant of BPH, CKD, type 2 diabetes, hypertension, hyperlipidemia, prior TIA who presents emergency department with confusion.  Patient was previously admitted for E. coli bacteremia secondary to UTI.  Family noted him to be more aggressive and altered.  He was agitated not acting his usual self.  He was brought to the ER he was found to be afebrile hemodynamically stable.  Blood pressure shows systolics greater than 180.  Labs were obtained which demonstrated WBC 7.9, hemoglobin 12.4, creatinine 1.7, bicarb 21.  CT head showed no acute findings.  Chest x-ray demonstrated no acute airspace opacities.  CT abdomen showed diverticulosis and chronic changes.  Patient was diagnosed with hypertensive encephalopathy admitted for further workup. On evaluation he was resting comfortably.  Family remained at bedside and state he was still altered.  Assessment and Plan:  Possible acute hypertensive encephalopathy - Systolic blood pressure greater than 180 on presentation with altered mental status and lethargy.  Patient initiated on p.o. Coreg , IV hydralazine  as needed.  Showed improvement over the night.  Mild leukocytosis resolved.  This morning alert, talkative, cooperative.  Systolic blood pressure  is well-controlled.  Peers to be resolved at this time.  Will transition patient to p.o. carvedilol  to take as directed.  Recommend follow-up in the outpatient setting to titrate antihypertensive regiment.  CKD 3 - BUN and creatinine appear at baseline.          Consultants: None Procedures performed: None Disposition: Home Diet recommendation:  Discharge Diet Orders (From admission, onward)     Start     Ordered   07/27/23 0000  Diet - low sodium heart healthy        07/27/23 1003           Cardiac diet DISCHARGE MEDICATION: Allergies as of 07/27/2023       Reactions   Codeine Itching, Rash, Other (See Comments)   General discomfort, too   Penicillins Itching, Rash   Did it involve swelling of the face/tongue/throat, SOB, or low BP? No Did it involve sudden or severe rash/hives, skin peeling, or any reaction on the inside of your mouth or nose? Yes Did you need to seek medical attention at a hospital or doctor's office? No When did it last happen?   Decades Ago    If all above answers are "NO", may proceed with cephalosporin use.        Medication List     STOP taking these medications    cefdinir  300 MG capsule Commonly known as: OMNICEF    melatonin 5 MG Tabs   ondansetron  4 MG disintegrating tablet Commonly known as: ZOFRAN -ODT   polyethylene glycol 17 g packet Commonly known as: MIRALAX  / GLYCOLAX    tamsulosin  0.4 MG Caps capsule Commonly known as: FLOMAX    traMADol  50 MG tablet  Commonly known as: ULTRAM        TAKE these medications    BC HEADACHE PO Take 1 packet by mouth 2 (two) times daily as needed (pain).   carvedilol  3.125 MG tablet Commonly known as: COREG  Take 1 tablet (3.125 mg total) by mouth 2 (two) times daily with a meal.        Discharge Exam: Filed Weights   07/26/23 1317  Weight: 62.3 kg   GENERAL:  Alert, pleasant, looks considerably younger than stated age HEENT:  EOMI CARDIOVASCULAR:  RRR, no murmurs  appreciated RESPIRATORY:  Clear to auscultation, no wheezing, rales, or rhonchi GASTROINTESTINAL:  Soft, nontender, nondistended EXTREMITIES:  No LE edema bilaterally NEURO:  No new focal deficits appreciated SKIN:  No rashes noted PSYCH:  Appropriate mood and affect    Condition at discharge: improving  The results of significant diagnostics from this hospitalization (including imaging, microbiology, ancillary and laboratory) are listed below for reference.   Imaging Studies: CT Renal Stone Study Result Date: 07/26/2023 CLINICAL DATA:  Back pain, hypertension, urolithiasis EXAM: CT ABDOMEN AND PELVIS WITHOUT CONTRAST TECHNIQUE: Multidetector CT imaging of the abdomen and pelvis was performed following the standard protocol without IV contrast. RADIATION DOSE REDUCTION: This exam was performed according to the departmental dose-optimization program which includes automated exposure control, adjustment of the mA and/or kV according to patient size and/or use of iterative reconstruction technique. COMPARISON:  12/15/2021 FINDINGS: Lower chest: Chronic rounded atelectasis within the right lower lobe, with associated pleural thickening. No acute pleural or parenchymal lung disease. Hepatobiliary: Unremarkable unenhanced appearance of the liver and gallbladder. Pancreas: Unremarkable unenhanced appearance. Spleen: Unremarkable unenhanced appearance. Adrenals/Urinary Tract: Stable bilateral renal cortical cysts which do not require specific imaging follow-up. No urinary tract calculi or obstructive uropathy within either kidney. Trabeculated distended bladder consistent with chronic bladder outlet obstruction. The adrenals are unremarkable. Stomach/Bowel: No bowel obstruction or ileus. Normal appendix right lower quadrant. Extensive diverticulosis throughout the colon without evidence of acute diverticulitis. No bowel wall thickening or inflammatory change. Vascular/Lymphatic: Aortic atherosclerosis. No  enlarged abdominal or pelvic lymph nodes. Reproductive: Stable enlarged prostate. Other: No free fluid or free intraperitoneal gas. Postsurgical changes from prior right inguinal hernia repair, with small residual or recurrent fat containing right inguinal hernia. No bowel herniation. Musculoskeletal: No acute or destructive bony abnormalities. Reconstructed images demonstrate no additional findings. IMPRESSION: 1. No evidence of urinary tract calculi or obstructive uropathy. 2. Diffuse colonic diverticulosis without diverticulitis. 3. Enlarged prostate, with stable findings of chronic bladder outlet obstruction. 4.  Aortic Atherosclerosis (ICD10-I70.0). 5. Stable small fat containing right inguinal hernia. 6. Stable right lower lobe rounded atelectasis and overlying pleural thickening. Electronically Signed   By: Bobbye Burrow M.D.   On: 07/26/2023 17:05   DG Chest Port 1 View Result Date: 07/26/2023 CLINICAL DATA:  Altered mental status EXAM: PORTABLE CHEST 1 VIEW COMPARISON:  12/16/2021 FINDINGS: The heart size and mediastinal contours are within normal limits. Bandlike scarring of the right lung base. No acute airspace opacity. Large calcified loose bodies in the glenohumeral joints. IMPRESSION: Bandlike scarring of the right lung base. No acute airspace opacity. Electronically Signed   By: Fredricka Jenny M.D.   On: 07/26/2023 16:33   CT Head Wo Contrast Result Date: 07/26/2023 CLINICAL DATA:  Mental status change, urinary frequency for 4 days, intermittent confusion with aggression EXAM: CT HEAD WITHOUT CONTRAST TECHNIQUE: Contiguous axial images were obtained from the base of the skull through the vertex without intravenous contrast. RADIATION DOSE REDUCTION:  This exam was performed according to the departmental dose-optimization program which includes automated exposure control, adjustment of the mA and/or kV according to patient size and/or use of iterative reconstruction technique. COMPARISON:   09/08/2020 FINDINGS: Brain: No intracranial hemorrhage, mass effect, or evidence of acute infarct. No hydrocephalus. No extra-axial fluid collection. Age-commensurate cerebral atrophy and chronic small vessel ischemic disease. Chronic bilateral basal ganglia infarcts. Vascular: No hyperdense vessel. Intracranial arterial calcification. Skull: No fracture or focal lesion. Sinuses/Orbits: No acute finding. Other: None. IMPRESSION: No acute intracranial abnormality. Electronically Signed   By: Rozell Cornet M.D.   On: 07/26/2023 14:09    Microbiology: Results for orders placed or performed during the hospital encounter of 07/26/23  Resp panel by RT-PCR (RSV, Flu A&B, Covid) Anterior Nasal Swab     Status: None   Collection Time: 07/26/23  4:06 PM   Specimen: Anterior Nasal Swab  Result Value Ref Range Status   SARS Coronavirus 2 by RT PCR NEGATIVE NEGATIVE Final    Comment: (NOTE) SARS-CoV-2 target nucleic acids are NOT DETECTED.  The SARS-CoV-2 RNA is generally detectable in upper respiratory specimens during the acute phase of infection. The lowest concentration of SARS-CoV-2 viral copies this assay can detect is 138 copies/mL. A negative result does not preclude SARS-Cov-2 infection and should not be used as the sole basis for treatment or other patient management decisions. A negative result may occur with  improper specimen collection/handling, submission of specimen other than nasopharyngeal swab, presence of viral mutation(s) within the areas targeted by this assay, and inadequate number of viral copies(<138 copies/mL). A negative result must be combined with clinical observations, patient history, and epidemiological information. The expected result is Negative.  Fact Sheet for Patients:  BloggerCourse.com  Fact Sheet for Healthcare Providers:  SeriousBroker.it  This test is no t yet approved or cleared by the [place]  FDA  and  has been authorized for detection and/or diagnosis of SARS-CoV-2 by FDA under an Emergency Use Authorization (EUA). This EUA will remain  in effect (meaning this test can be used) for the duration of the COVID-19 declaration under Section 564(b)(1) of the Act, 21 U.S.C.section 360bbb-3(b)(1), unless the authorization is terminated  or revoked sooner.       Influenza A by PCR NEGATIVE NEGATIVE Final   Influenza B by PCR NEGATIVE NEGATIVE Final    Comment: (NOTE) The Xpert Xpress SARS-CoV-2/FLU/RSV plus assay is intended as an aid in the diagnosis of influenza from Nasopharyngeal swab specimens and should not be used as a sole basis for treatment. Nasal washings and aspirates are unacceptable for Xpert Xpress SARS-CoV-2/FLU/RSV testing.  Fact Sheet for Patients: BloggerCourse.com  Fact Sheet for Healthcare Providers: SeriousBroker.it  This test is not yet approved or cleared by the [place]  FDA and has been authorized for detection and/or diagnosis of SARS-CoV-2 by FDA under an Emergency Use Authorization (EUA). This EUA will remain in effect (meaning this test can be used) for the duration of the COVID-19 declaration under Section 564(b)(1) of the Act, 21 U.S.C. section 360bbb-3(b)(1), unless the authorization is terminated or revoked.     Resp Syncytial Virus by PCR NEGATIVE NEGATIVE Final    Comment: (NOTE) Fact Sheet for Patients: BloggerCourse.com  Fact Sheet for Healthcare Providers: SeriousBroker.it  This test is not yet approved or cleared by the [place]  FDA and has been authorized for detection and/or diagnosis of SARS-CoV-2 by FDA under an Emergency Use Authorization (EUA). This EUA will remain in effect (meaning this test can  be used) for the duration of the COVID-19 declaration under Section 564(b)(1) of the Act, 21 U.S.C. section 360bbb-3(b)(1),  unless the authorization is terminated or revoked.  Performed at [facility], [address], [place], [place] [address]     Labs: CBC: Recent Labs  Lab 07/26/23 1344 07/26/23 1958 07/27/23 0802  WBC 7.9 10.9* 7.4  NEUTROABS 4.5  --   --   HGB 12.4* 12.8* 11.8*  HCT 36.9* 37.9* 35.9*  MCV 93.9 92.9 92.5  PLT 252 232 238   Basic Metabolic Panel: Recent Labs  Lab 07/26/23 1344 07/26/23 1826 07/27/23 0802  NA 136  --  134*  K 4.2  --  4.2  CL 103  --  102  CO2 21*  --  21*  GLUCOSE 135*  --  110*  BUN 35*  --  34*  CREATININE 1.71* 1.54* 1.58*  CALCIUM  9.0  --  8.7*   Liver Function Tests: Recent Labs  Lab 07/26/23 1344  AST 25  ALT 15  ALKPHOS 92  BILITOT 1.0  PROT 7.4  ALBUMIN 3.6   CBG: Recent Labs  Lab 07/26/23 1313  GLUCAP 128*    Discharge time spent: less than 30 minutes.  Signed: Jodeane Mulligan, DO [facility] 07/27/2023

## 2023-07-27 NOTE — ED Provider Notes (Signed)
[facility] AT [facility] Provider Note   CSN: [id number] Arrival date & time: 07/27/23  2258     History  Chief Complaint  Patient presents with   Blood Infection    Aaron Landry is a 89 y.o. male.  The history is provided by the patient, a relative and medical records.   Aaron Landry is a 89 y.o. male who presents to the Emergency Department complaining of abnormal lab.  He presents the emergency department accompanied by his son for evaluation of abnormal lab.  He was called back for positive blood cultures.  He was discharged from the hospital earlier today and since states that he has been sleeping the entire time outside of being woken up for dinner and blood pressure meds.  He was initially brought into the hospital 2 days ago for agitation, confusion and urinary symptoms.  No fever.  He is eating near his baseline but does not eat much at baseline.  He lives with his son.  He ambulates with a walker.  Over the last few days he has not been able to walk without two-person assistance.  He has nausea but no cough, vomiting, diarrhea.  Patient states that he feels unwell in general but has no additional complaints and in particular no complaints of pain.    Home Medications Prior to Admission medications   Medication Sig Start Date End Date Taking? Authorizing Provider  Aspirin-Salicylamide-Caffeine (BC HEADACHE PO) Take 1 packet by mouth 2 (two) times daily as needed (pain).   Yes [provider]  carvedilol (COREG) 3.125 MG tablet Take 1 tablet (3.125 mg total) by mouth 2 (two) times daily with a meal. 07/27/23  Yes Deanna Artis, DO      Allergies    Codeine and Penicillins    Review of Systems   Review of Systems  All other systems reviewed and are negative.   Physical Exam Updated Vital Signs BP (!) 141/65   Pulse 64   Temp 98 F (36.7 C) (Oral)   Resp 15   SpO2 98%  Physical Exam Vitals and nursing note reviewed.   Constitutional:      Appearance: He is well-developed.  HENT:     Head: Normocephalic and atraumatic.  Cardiovascular:     Rate and Rhythm: Normal rate and regular rhythm.     Heart sounds: No murmur heard. Pulmonary:     Effort: Pulmonary effort is normal. No respiratory distress.     Breath sounds: Normal breath sounds.  Abdominal:     Palpations: Abdomen is soft.     Tenderness: There is no abdominal tenderness. There is no guarding or rebound.  Musculoskeletal:        General: No tenderness.  Skin:    General: Skin is warm and dry.  Neurological:     Mental Status: He is alert and oriented to person, place, and time.     Comments: No asymmetry of facial movements.  5/5 strength in all four extremities.  Oriented to person, place, president, disoriented to year.   Psychiatric:        Behavior: Behavior normal.    ED Results / Procedures / Treatments   Labs (all labs ordered are listed, but only abnormal results are displayed) Labs Reviewed  COMPREHENSIVE METABOLIC PANEL - Abnormal; Notable for the following components:      Result Value   CO2 21 (*)    Glucose, Bld 173 (*)    BUN  38 (*)    Creatinine, Ser 1.91 (*)    Calcium 8.7 (*)    Albumin 3.4 (*)    GFR, Estimated 32 (*)    All other components within normal limits  CBC WITH DIFFERENTIAL/PLATELET - Abnormal; Notable for the following components:   RBC 3.73 (*)    Hemoglobin 11.5 (*)    HCT 34.8 (*)    All other components within normal limits  URINALYSIS, W/ REFLEX TO CULTURE (INFECTION SUSPECTED) - Abnormal; Notable for the following components:   Glucose, UA 50 (*)    Bacteria, UA RARE (*)    All other components within normal limits  CULTURE, BLOOD (ROUTINE X 2)  CULTURE, BLOOD (ROUTINE X 2)    EKG None  Radiology CT Renal Stone Study Result Date: 07/26/2023 CLINICAL DATA:  Back pain, hypertension, urolithiasis EXAM: CT ABDOMEN AND PELVIS WITHOUT CONTRAST TECHNIQUE: Multidetector CT imaging of the  abdomen and pelvis was performed following the standard protocol without IV contrast. RADIATION DOSE REDUCTION: This exam was performed according to the departmental dose-optimization program which includes automated exposure control, adjustment of the mA and/or kV according to patient size and/or use of iterative reconstruction technique. COMPARISON:  12/15/2021 FINDINGS: Lower chest: Chronic rounded atelectasis within the right lower lobe, with associated pleural thickening. No acute pleural or parenchymal lung disease. Hepatobiliary: Unremarkable unenhanced appearance of the liver and gallbladder. Pancreas: Unremarkable unenhanced appearance. Spleen: Unremarkable unenhanced appearance. Adrenals/Urinary Tract: Stable bilateral renal cortical cysts which do not require specific imaging follow-up. No urinary tract calculi or obstructive uropathy within either kidney. Trabeculated distended bladder consistent with chronic bladder outlet obstruction. The adrenals are unremarkable. Stomach/Bowel: No bowel obstruction or ileus. Normal appendix right lower quadrant. Extensive diverticulosis throughout the colon without evidence of acute diverticulitis. No bowel wall thickening or inflammatory change. Vascular/Lymphatic: Aortic atherosclerosis. No enlarged abdominal or pelvic lymph nodes. Reproductive: Stable enlarged prostate. Other: No free fluid or free intraperitoneal gas. Postsurgical changes from prior right inguinal hernia repair, with small residual or recurrent fat containing right inguinal hernia. No bowel herniation. Musculoskeletal: No acute or destructive bony abnormalities. Reconstructed images demonstrate no additional findings. IMPRESSION: 1. No evidence of urinary tract calculi or obstructive uropathy. 2. Diffuse colonic diverticulosis without diverticulitis. 3. Enlarged prostate, with stable findings of chronic bladder outlet obstruction. 4.  Aortic Atherosclerosis (ICD10-I70.0). 5. Stable small fat  containing right inguinal hernia. 6. Stable right lower lobe rounded atelectasis and overlying pleural thickening. Electronically Signed   By: Sharlet Salina M.D.   On: 07/26/2023 17:05   DG Chest Port 1 View Result Date: 07/26/2023 CLINICAL DATA:  Altered mental status EXAM: PORTABLE CHEST 1 VIEW COMPARISON:  12/16/2021 FINDINGS: The heart size and mediastinal contours are within normal limits. Bandlike scarring of the right lung base. No acute airspace opacity. Large calcified loose bodies in the glenohumeral joints. IMPRESSION: Bandlike scarring of the right lung base. No acute airspace opacity. Electronically Signed   By: Jearld Lesch M.D.   On: 07/26/2023 16:33   CT Head Wo Contrast Result Date: 07/26/2023 CLINICAL DATA:  Mental status change, urinary frequency for 4 days, intermittent confusion with aggression EXAM: CT HEAD WITHOUT CONTRAST TECHNIQUE: Contiguous axial images were obtained from the base of the skull through the vertex without intravenous contrast. RADIATION DOSE REDUCTION: This exam was performed according to the departmental dose-optimization program which includes automated exposure control, adjustment of the mA and/or kV according to patient size and/or use of iterative reconstruction technique. COMPARISON:  09/08/2020 FINDINGS: Brain:  No intracranial hemorrhage, mass effect, or evidence of acute infarct. No hydrocephalus. No extra-axial fluid collection. Age-commensurate cerebral atrophy and chronic small vessel ischemic disease. Chronic bilateral basal ganglia infarcts. Vascular: No hyperdense vessel. Intracranial arterial calcification. Skull: No fracture or focal lesion. Sinuses/Orbits: No acute finding. Other: None. IMPRESSION: No acute intracranial abnormality. Electronically Signed   By: Minerva Fester M.D.   On: 07/26/2023 14:09    Procedures Procedures    Medications Ordered in ED Medications  sodium chloride 0.9 % bolus 500 mL (500 mLs Intravenous New Bag/Given  07/28/23 0129)    ED Course/ Medical Decision Making/ A&P                                 Medical Decision Making Amount and/or Complexity of Data Reviewed Labs: ordered. Radiology: ordered.  Risk Decision regarding hospitalization.   Patient brought in for abnormal blood cultures.  On record review these appear to be skin contaminant.  He does not have any signs or symptoms of sepsis.  BMP with rising creatinine, BUN when compared to recent during hospitalization.  Will obtain repeat blood cultures.  Son is significantly concerned for patient's safety as he is no longer able to ambulate.  Given rising creatinine, inability to ambulate medicine consulted for repeat observation for patient's persistent change in mental status in addition to his renal function changes.        Final Clinical Impression(s) / ED Diagnoses Final diagnoses:  Altered mental status, unspecified altered mental status type  AKI (acute kidney injury) [facility])    Rx / DC Orders ED Discharge Orders     None         Tilden Fossa, MD 07/28/23 [phone]

## 2023-07-27 NOTE — Care Management Obs Status (Signed)
 MEDICARE OBSERVATION STATUS NOTIFICATION   Patient Details  Name: Aaron Landry MRN: [id number] Date of Birth: 31-Dec-1924   Medicare Observation Status Notification Given:  Yes    Jabier Martens, LCSW 07/27/2023, 11:00 AM

## 2023-07-28 ENCOUNTER — Observation Stay (HOSPITAL_COMMUNITY): Payer: Medicare Other

## 2023-07-28 ENCOUNTER — Other Ambulatory Visit: Payer: Self-pay

## 2023-07-28 DIAGNOSIS — R531 Weakness: Secondary | ICD-10-CM | POA: Diagnosis not present

## 2023-07-28 DIAGNOSIS — N1832 Chronic kidney disease, stage 3b: Secondary | ICD-10-CM

## 2023-07-28 DIAGNOSIS — E119 Type 2 diabetes mellitus without complications: Secondary | ICD-10-CM

## 2023-07-28 DIAGNOSIS — I10 Essential (primary) hypertension: Secondary | ICD-10-CM

## 2023-07-28 DIAGNOSIS — R4182 Altered mental status, unspecified: Secondary | ICD-10-CM

## 2023-07-28 DIAGNOSIS — N179 Acute kidney failure, unspecified: Secondary | ICD-10-CM

## 2023-07-28 DIAGNOSIS — N189 Chronic kidney disease, unspecified: Secondary | ICD-10-CM | POA: Diagnosis present

## 2023-07-28 LAB — COMPREHENSIVE METABOLIC PANEL
ALT: 15 U/L (ref 0–44)
AST: 26 U/L (ref 15–41)
Albumin: 3.4 g/dL — ABNORMAL LOW (ref 3.5–5.0)
Alkaline Phosphatase: 80 U/L (ref 38–126)
Anion gap: 10 (ref 5–15)
BUN: 38 mg/dL — ABNORMAL HIGH (ref 8–23)
CO2: 21 mmol/L — ABNORMAL LOW (ref 22–32)
Calcium: 8.7 mg/dL — ABNORMAL LOW (ref 8.9–10.3)
Chloride: 104 mmol/L (ref 98–111)
Creatinine, Ser: 1.91 mg/dL — ABNORMAL HIGH (ref 0.61–1.24)
GFR, Estimated: 32 mL/min — ABNORMAL LOW (ref >60)
Glucose, Bld: 173 mg/dL — ABNORMAL HIGH (ref 70–99)
Potassium: 3.9 mmol/L (ref 3.5–5.1)
Sodium: 135 mmol/L (ref 135–145)
Total Bilirubin: 1 mg/dL (ref 0.0–1.2)
Total Protein: 6.8 g/dL (ref 6.5–8.1)

## 2023-07-28 LAB — URINALYSIS, W/ REFLEX TO CULTURE (INFECTION SUSPECTED)
Bilirubin Urine: NEGATIVE
Glucose, UA: 50 mg/dL — AB
Hgb urine dipstick: NEGATIVE
Ketones, ur: NEGATIVE mg/dL
Leukocytes,Ua: NEGATIVE
Nitrite: NEGATIVE
Protein, ur: NEGATIVE mg/dL
Specific Gravity, Urine: 1.011 (ref 1.005–1.030)
pH: 5 (ref 5.0–8.0)

## 2023-07-28 LAB — CBC WITH DIFFERENTIAL/PLATELET
Abs Immature Granulocytes: 0.04 10*3/uL (ref 0.00–0.07)
Abs Immature Granulocytes: 0.05 10*3/uL (ref 0.00–0.07)
Basophils Absolute: 0.1 10*3/uL (ref 0.0–0.1)
Basophils Absolute: 0.1 10*3/uL (ref 0.0–0.1)
Basophils Relative: 1 %
Basophils Relative: 1 %
Eosinophils Absolute: 0.4 10*3/uL (ref 0.0–0.5)
Eosinophils Absolute: 0.5 10*3/uL (ref 0.0–0.5)
Eosinophils Relative: 6 %
Eosinophils Relative: 6 %
HCT: 34.8 % — ABNORMAL LOW (ref 39.0–52.0)
HCT: 37 % — ABNORMAL LOW (ref 39.0–52.0)
Hemoglobin: 11.5 g/dL — ABNORMAL LOW (ref 13.0–17.0)
Hemoglobin: 12.2 g/dL — ABNORMAL LOW (ref 13.0–17.0)
Immature Granulocytes: 1 %
Immature Granulocytes: 1 %
Lymphocytes Relative: 20 %
Lymphocytes Relative: 25 %
Lymphs Abs: 1.5 10*3/uL (ref 0.7–4.0)
Lymphs Abs: 1.8 10*3/uL (ref 0.7–4.0)
MCH: 30.7 pg (ref 26.0–34.0)
MCH: 30.8 pg (ref 26.0–34.0)
MCHC: 33 g/dL (ref 30.0–36.0)
MCHC: 33 g/dL (ref 30.0–36.0)
MCV: 93.2 fL (ref 80.0–100.0)
MCV: 93.3 fL (ref 80.0–100.0)
Monocytes Absolute: 0.5 10*3/uL (ref 0.1–1.0)
Monocytes Absolute: 0.7 10*3/uL (ref 0.1–1.0)
Monocytes Relative: 7 %
Monocytes Relative: 9 %
Neutro Abs: 4.3 10*3/uL (ref 1.7–7.7)
Neutro Abs: 4.9 10*3/uL (ref 1.7–7.7)
Neutrophils Relative %: 60 %
Neutrophils Relative %: 63 %
Platelets: 223 10*3/uL (ref 150–400)
Platelets: 225 10*3/uL (ref 150–400)
RBC: 3.73 MIL/uL — ABNORMAL LOW (ref 4.22–5.81)
RBC: 3.97 MIL/uL — ABNORMAL LOW (ref 4.22–5.81)
RDW: 13.2 % (ref 11.5–15.5)
RDW: 13.2 % (ref 11.5–15.5)
WBC: 7.1 10*3/uL (ref 4.0–10.5)
WBC: 7.7 10*3/uL (ref 4.0–10.5)
nRBC: 0 % (ref 0.0–0.2)
nRBC: 0 % (ref 0.0–0.2)

## 2023-07-28 LAB — BLOOD CULTURE ID PANEL (REFLEXED) - BCID2

## 2023-07-28 LAB — BASIC METABOLIC PANEL
Anion gap: 11 (ref 5–15)
BUN: 36 mg/dL — ABNORMAL HIGH (ref 8–23)
CO2: 20 mmol/L — ABNORMAL LOW (ref 22–32)
Calcium: 8.4 mg/dL — ABNORMAL LOW (ref 8.9–10.3)
Chloride: 106 mmol/L (ref 98–111)
Creatinine, Ser: 1.78 mg/dL — ABNORMAL HIGH (ref 0.61–1.24)
GFR, Estimated: 34 mL/min — ABNORMAL LOW (ref >60)
Glucose, Bld: 129 mg/dL — ABNORMAL HIGH (ref 70–99)
Potassium: 3.7 mmol/L (ref 3.5–5.1)
Sodium: 137 mmol/L (ref 135–145)

## 2023-07-28 LAB — CBG MONITORING, ED
Glucose-Capillary: 119 mg/dL — ABNORMAL HIGH (ref 70–99)
Glucose-Capillary: 152 mg/dL — ABNORMAL HIGH (ref 70–99)

## 2023-07-28 LAB — HEMOGLOBIN A1C
Hgb A1c MFr Bld: 7 % — ABNORMAL HIGH (ref 4.8–5.6)
Mean Plasma Glucose: 154.2 mg/dL

## 2023-07-28 LAB — GLUCOSE, CAPILLARY
Glucose-Capillary: 180 mg/dL — ABNORMAL HIGH (ref 70–99)
Glucose-Capillary: 182 mg/dL — ABNORMAL HIGH (ref 70–99)

## 2023-07-28 MED ORDER — ACETAMINOPHEN 650 MG RE SUPP: 650.0000 mg | Freq: Four times a day (QID) | RECTAL | Status: DC | PRN

## 2023-07-28 MED ORDER — INSULIN ASPART 100 UNIT/ML IJ SOLN
0.0000 [IU] | Freq: Three times a day (TID) | INTRAMUSCULAR | Status: DC
  Administered 2023-07-28: 2 [IU] via SUBCUTANEOUS
  Administered 2023-07-29 (×2): 1 [IU] via SUBCUTANEOUS
  Administered 2023-07-29: 2 [IU] via SUBCUTANEOUS
  Administered 2023-07-30: 1 [IU] via SUBCUTANEOUS
  Administered 2023-07-30: 3 [IU] via SUBCUTANEOUS
  Administered 2023-07-30: 1 [IU] via SUBCUTANEOUS
  Filled 2023-07-28: qty 0.09

## 2023-07-28 MED ORDER — DEXTROSE-SODIUM CHLORIDE 5-0.45 % IV SOLN: INTRAVENOUS | Status: AC

## 2023-07-28 MED ORDER — ENOXAPARIN SODIUM 30 MG/0.3ML IJ SOSY
30.0000 mg | PREFILLED_SYRINGE | INTRAMUSCULAR | Status: DC
  Administered 2023-07-28 – 2023-07-31 (×4): 30 mg via SUBCUTANEOUS
  Filled 2023-07-28 (×4): qty 0.3

## 2023-07-28 MED ORDER — HYDRALAZINE HCL 20 MG/ML IJ SOLN: 10.0000 mg | Freq: Four times a day (QID) | INTRAMUSCULAR | Status: DC | PRN

## 2023-07-28 MED ORDER — SODIUM CHLORIDE 0.9 % IV BOLUS
500.0000 mL | Freq: Once | INTRAVENOUS | Status: AC
  Administered 2023-07-28: 500 mL via INTRAVENOUS

## 2023-07-28 MED ORDER — ACETAMINOPHEN 325 MG PO TABS: 650.0000 mg | ORAL_TABLET | Freq: Four times a day (QID) | ORAL | Status: DC | PRN

## 2023-07-28 MED ORDER — SODIUM CHLORIDE 0.9 % IV SOLN: INTRAVENOUS | Status: DC

## 2023-07-28 MED ORDER — INSULIN ASPART 100 UNIT/ML IJ SOLN
0.0000 [IU] | Freq: Every day | INTRAMUSCULAR | Status: DC
  Filled 2023-07-28: qty 0.05

## 2023-07-28 NOTE — Progress Notes (Signed)
Bedside swallow eval completed, full report to follow. Patient presents with right facial asymmetry otherwise CN exam unremarkable.  Looking at prior pictures on son's phone, the facial asymmetry is baseline.  Pt easily passed a 3 ounce Yale swallow screen.  Easily consumed sandwich, applesauce, juice 4 ounces and thin water 3 ounces.  No indication of dysphagia nor aspiration. Swallow is timely without indication of oropharyngeal retention.  Recommend regular/thin diet with general precautions.   Educated pt and his two sons.    Rolena Infante, MS [employer] SLP Acute [employer] [phone]

## 2023-07-28 NOTE — Evaluation (Signed)
Physical Therapy Evaluation Patient Details Name: Aaron Landry MRN: [id number] DOB: [birth year] Today's Date: 07/28/2023  History of Present Illness  89 yo male presents to the hospital on 2/10 with abnormal labs. Patient was admitted with acute renal failure, dehydration. PMH: CKD III, DM II, HTN  Clinical Impression   Pt admitted with above diagnosis.  Pt currently with functional limitations due to the deficits listed below (see PT Problem List). Pt in bed when therapist arrived. Pt agreeable to therapy intervention. Pt is unclear as to why he is in the hospital and appears to be a poor historian. When asked whom assist pt at home he said he lives with his father, however pt lives with son. Pt required CGA for supine to sit, CGA and cues for sit to stand  from EOB with min A to maintain balance due to posterior lean, strong cues to use RW for safety and stability, gait tasks 6 feet with RW and cues for RW management with CGA. Pt denies pain, dizziness and SOB. Pt left in bed and all needs in place. Pt will benefit from ongoing therapy services following d/c with [facility] services.   Pt will benefit from acute skilled PT to increase their independence and safety with mobility to allow discharge.         If plan is discharge home, recommend the following: A little help with walking and/or transfers;A little help with bathing/dressing/bathroom;Assistance with cooking/housework;Assist for transportation;Help with stairs or ramp for entrance   Can travel by private vehicle        Equipment Recommendations Rolling walker (2 wheels)  Recommendations for Other Services       Functional Status Assessment Patient has had a recent decline in their functional status and demonstrates the ability to make significant improvements in function in a reasonable and predictable amount of time.     Precautions / Restrictions Precautions Precautions: Fall Restrictions Weight Bearing Restrictions Per Provider  Order: No      Mobility  Bed Mobility Overal bed mobility: Needs Assistance Bed Mobility: Supine to Sit, Sit to Supine     Supine to sit: Contact guard Sit to supine: Contact guard assist   General bed mobility comments: with increased time    Transfers Overall transfer level: Needs assistance   Transfers: Sit to/from Stand Sit to Stand: Contact guard assist, From elevated surface           General transfer comment: min cues for safety and use of RW with pt making attempts to let go of RW and reliant on support from B posterior LE on bed.    Ambulation/Gait Ambulation/Gait assistance: Contact guard assist Gait Distance (Feet): 6 Feet Assistive device: Rolling walker (2 wheels) Gait Pattern/deviations: Step-to pattern, Trunk flexed Gait velocity: decreased     General Gait Details: gait limited due to fatigue today, pt demonstrated difficulty with RW management with PT providing instruction for amb 3 feet forward and then backward to bed, pt elected to turn and pick up the RW and attempt to walk back to EOB with RW elevated, pt redirected and ed provided on safety and need for RW for stabiltiy and energy conservation at this time  Stairs            Wheelchair Mobility     Tilt Bed    Modified Rankin (Stroke Patients Only)       Balance Overall balance assessment: Mild deficits observed, not formally tested  Pertinent Vitals/Pain Pain Assessment Pain Assessment: No/denies pain    Home Living Family/patient expects to be discharged to:: Private residence Living Arrangements: Children Available Help at Discharge: Family Type of Home: House Home Access: Level entry       Home Layout: Laundry or work area in basement;One level Home Equipment: Cane - single point Additional Comments: pt appears not to be a through historian at this time, pt indicated that he was living with his dad and is  not quite sure why he is here. when asked if son assist pt reports well he drove me here    Prior Function Prior Level of Function : Independent/Modified Independent             Mobility Comments: SPC PRN ADLs Comments: IND     Extremity/Trunk Assessment   Upper Extremity Assessment Upper Extremity Assessment: Overall WFL for tasks assessed    Lower Extremity Assessment Lower Extremity Assessment: Overall WFL for tasks assessed    Cervical / Trunk Assessment Cervical / Trunk Assessment: Kyphotic  Communication   Communication Communication: Impaired    Cognition Arousal: Alert Behavior During Therapy: Flat affect   PT - Cognitive impairments: No family/caregiver present to determine baseline                         Following commands: Impaired       Cueing       General Comments      Exercises     Assessment/Plan    PT Assessment Patient needs continued PT services  PT Problem List Decreased strength;Decreased range of motion;Decreased activity tolerance;Decreased balance;Decreased mobility;Decreased coordination;Decreased knowledge of use of DME;Decreased safety awareness;Decreased knowledge of precautions       PT Treatment Interventions DME instruction;Gait training;Functional mobility training;Therapeutic activities;Therapeutic exercise;Balance training;Neuromuscular re-education;Patient/family education    PT Goals (Current goals can be found in the Care Plan section)  Acute Rehab PT Goals Patient Stated Goal: to go home PT Goal Formulation: With patient Time For Goal Achievement: 08/11/23 Potential to Achieve Goals: Good    Frequency Min 1X/week     Co-evaluation PT/OT/SLP Co-Evaluation/Treatment: Yes Reason for Co-Treatment: To address functional/ADL transfers PT goals addressed during session: Mobility/safety with mobility OT goals addressed during session: ADL's and self-care       AM-PAC PT "6 Clicks" Mobility  Outcome  Measure Help needed turning from your back to your side while in a flat bed without using bedrails?: A Little Help needed moving from lying on your back to sitting on the side of a flat bed without using bedrails?: A Little Help needed moving to and from a bed to a chair (including a wheelchair)?: A Little Help needed standing up from a chair using your arms (e.g., wheelchair or bedside chair)?: A Little Help needed to walk in hospital room?: A Little Help needed climbing 3-5 steps with a railing? : Total 6 Click Score: 16    End of Session Equipment Utilized During Treatment: Gait belt Activity Tolerance: Patient limited by fatigue Patient left: in bed;with call bell/phone within reach Nurse Communication: Mobility status PT Visit Diagnosis: Unsteadiness on feet (R26.81);Other abnormalities of gait and mobility (R26.89);Muscle weakness (generalized) (M62.81);Difficulty in walking, not elsewhere classified (R26.2)    Time: 1610-9604 PT Time Calculation (min) (ACUTE ONLY): 21 min   Charges:   PT Evaluation $PT Eval Low Complexity: 1 Low   PT General Charges $$ ACUTE PT VISIT: 1 Visit  [name], PT Acute Rehab   [name] 07/28/2023, 5:06 PM

## 2023-07-28 NOTE — H&P (Signed)
PCP:   Geoffry Paradise, MD   Chief Complaint:  Weakness  HPI: This is a 89 year old male with past medical history of BPH, CKD, type 2 diabetes, hypertension, hyperlipidemia, prior TIA.  Patient admitted to/92/10 with hypertensive urgency and confusion..  Patient treated and discharged.  Family called yesterday told to return to the ER as blood cultures positive.  On review in the ER 1 of 2 blood cultures positive for staph, contaminant.  Patient hemodynamically stable.  Creatinine 1.9, previous at discharge 1.58.  Per family patient is not ambulating, weak.  Previously able to walk with a cane or walker.  Readmission requested for IV fluid hydration, PT consult.  Patient did not answer questions.  Grunted few times.  Review of Systems:  Cannot obtain.  Past Medical History: Past Medical History:  Diagnosis Date   BPH (benign prostatic hyperplasia)    Chronic kidney disease    Diabetes mellitus    Hyperlipidemia    Hypertension    OA (ocular albinism) (HCC)    Pleural effusion    Renal insufficiency    TIA (transient ischemic attack)    Past Surgical History:  Procedure Laterality Date   COLON SURGERY  2011   HERNIA REPAIR     LIVER BIOPSY      Medications: Prior to Admission medications   Medication Sig Start Date End Date Taking? Authorizing Provider  Aspirin-Salicylamide-Caffeine (BC HEADACHE PO) Take 1 packet by mouth 2 (two) times daily as needed (pain).    [provider]  carvedilol (COREG) 3.125 MG tablet Take 1 tablet (3.125 mg total) by mouth 2 (two) times daily with a meal. 07/27/23   Deanna Artis, DO    Allergies:   Allergies  Allergen Reactions   Codeine Itching, Rash and Other (See Comments)    General discomfort, too   Penicillins Itching and Rash    Did it involve swelling of the face/tongue/throat, SOB, or low BP? No Did it involve sudden or severe rash/hives, skin peeling, or any reaction on the inside of your mouth or nose? Yes Did you  need to seek medical attention at a hospital or doctor's office? No When did it last happen?   Decades Ago    If all above answers are "NO", may proceed with cephalosporin use.      Social History:  reports that he quit smoking about 42 years ago. His smoking use included cigarettes. He has never used smokeless tobacco. He reports that he does not drink alcohol and does not use drugs.  Family History: Family History  Problem Relation Age of Onset   Alzheimer's disease Mother    Coronary artery disease Father    Diabetes Father    Heart attack Father     Physical Exam: Vitals:   07/27/23 2307 07/27/23 2317 07/27/23 2334  BP: 130/65  118/69  Pulse: 87  71  Resp: 16  15  Temp:  98.3 F (36.8 C) 97.8 F (36.6 C)  TempSrc:  Oral Oral  SpO2: 100%  100%    General: Arousable, not very talkative, in no distress.  Elderly male Eyes: PERRLA, pink conjunctiva, no scleral icterus ENT: Moist oral mucosa, neck supple, no thyromegaly Lungs: CTA B/L, no wheeze, no crackles, no use of accessory muscles Cardiovascular: RRR, no regurgitation, no gallops, no murmurs. No carotid bruits, no JVD Abdomen: soft, positive BS, NTND, no organomegaly, not an acute abdomen GU: not examined Neuro: CN II - XII appears grossly intact Musculoskeletal: Moves all extremities no  clubbing, cyanosis or edema Skin: no rash, no subcutaneous crepitation, no decubitus   Labs on Admission:  Recent Labs    07/27/23 0802 07/28/23 0010  NA 134* 135  K 4.2 3.9  CL 102 104  CO2 21* 21*  GLUCOSE 110* 173*  BUN 34* 38*  CREATININE 1.58* 1.91*  CALCIUM 8.7* 8.7*   Recent Labs    07/26/23 1344 07/28/23 0010  AST 25 26  ALT 15 15  ALKPHOS 92 80  BILITOT 1.0 1.0  PROT 7.4 6.8  ALBUMIN 3.6 3.4*    Recent Labs    07/26/23 1344 07/26/23 1958 07/27/23 0802 07/28/23 0010  WBC 7.9   < > 7.4 7.7  NEUTROABS 4.5  --   --  4.9  HGB 12.4*   < > 11.8* 11.5*  HCT 36.9*   < > 35.9* 34.8*  MCV 93.9   < >  92.5 93.3  PLT 252   < > 238 223   < > = values in this interval not displayed.     Micro Results: Recent Results (from the past 240 hours)  Resp panel by RT-PCR (RSV, Flu A&B, Covid) Anterior Nasal Swab     Status: None   Collection Time: 07/26/23  4:06 PM   Specimen: Anterior Nasal Swab  Result Value Ref Range Status   SARS Coronavirus 2 by RT PCR NEGATIVE NEGATIVE Final    Comment: (NOTE) SARS-CoV-2 target nucleic acids are NOT DETECTED.  The SARS-CoV-2 RNA is generally detectable in upper respiratory specimens during the acute phase of infection. The lowest concentration of SARS-CoV-2 viral copies this assay can detect is 138 copies/mL. A negative result does not preclude SARS-Cov-2 infection and should not be used as the sole basis for treatment or other patient management decisions. A negative result may occur with  improper specimen collection/handling, submission of specimen other than nasopharyngeal swab, presence of viral mutation(s) within the areas targeted by this assay, and inadequate number of viral copies(<138 copies/mL). A negative result must be combined with clinical observations, patient history, and epidemiological information. The expected result is Negative.  Fact Sheet for Patients:  BloggerCourse.com  Fact Sheet for Healthcare Providers:  SeriousBroker.it  This test is no t yet approved or cleared by the [place] FDA and  has been authorized for detection and/or diagnosis of SARS-CoV-2 by FDA under an Emergency Use Authorization (EUA). This EUA will remain  in effect (meaning this test can be used) for the duration of the COVID-19 declaration under Section 564(b)(1) of the Act, 21 U.S.C.section 360bbb-3(b)(1), unless the authorization is terminated  or revoked sooner.       Influenza A by PCR NEGATIVE NEGATIVE Final   Influenza B by PCR NEGATIVE NEGATIVE Final    Comment: (NOTE) The Xpert  Xpress SARS-CoV-2/FLU/RSV plus assay is intended as an aid in the diagnosis of influenza from Nasopharyngeal swab specimens and should not be used as a sole basis for treatment. Nasal washings and aspirates are unacceptable for Xpert Xpress SARS-CoV-2/FLU/RSV testing.  Fact Sheet for Patients: BloggerCourse.com  Fact Sheet for Healthcare Providers: SeriousBroker.it  This test is not yet approved or cleared by the [place] FDA and has been authorized for detection and/or diagnosis of SARS-CoV-2 by FDA under an Emergency Use Authorization (EUA). This EUA will remain in effect (meaning this test can be used) for the duration of the COVID-19 declaration under Section 564(b)(1) of the Act, 21 U.S.C. section 360bbb-3(b)(1), unless the authorization is terminated or revoked.  Resp Syncytial Virus by PCR NEGATIVE NEGATIVE Final    Comment: (NOTE) Fact Sheet for Patients: BloggerCourse.com  Fact Sheet for Healthcare Providers: SeriousBroker.it  This test is not yet approved or cleared by the [place] FDA and has been authorized for detection and/or diagnosis of SARS-CoV-2 by FDA under an Emergency Use Authorization (EUA). This EUA will remain in effect (meaning this test can be used) for the duration of the COVID-19 declaration under Section 564(b)(1) of the Act, 21 U.S.C. section 360bbb-3(b)(1), unless the authorization is terminated or revoked.  Performed at [facility], [address], [place], [place] [address]   Urine Culture     Status: None   Collection Time: 07/26/23  4:55 PM   Specimen: Urine, Clean Catch  Result Value Ref Range Status   Specimen Description   Final    URINE, CLEAN CATCH Performed at [facility], [address] [place], [place] [address]    Special Requests   Final    NONE Performed at [facility], [address] [place], [place] [address]    Culture   Final    NO GROWTH Performed at [facility], [address], [place], [place] [address]    Report Status 07/27/2023 FINAL  Final  Culture, blood (Routine X 2) w Reflex to ID Panel     Status: None (Preliminary result)   Collection Time: 07/26/23  8:38 PM   Specimen: BLOOD RIGHT ARM  Result Value Ref Range Status   Specimen Description   Final    BLOOD RIGHT ARM Performed at [facility], [address], [place], [place] [address]    Special Requests   Final    BOTTLES DRAWN AEROBIC AND ANAEROBIC Blood Culture results may not be optimal due to an inadequate volume of blood received in culture bottles Performed at [facility], [address], [place], [place] [address]    Culture  Setup Time   Final    GRAM POSITIVE COCCI IN CLUSTERS ANAEROBIC BOTTLE ONLY CRITICAL RESULT CALLED TO, READ BACK BY AND VERIFIED WITH: RN Darcella Cheshire [phone] @ 2123 FH Performed at [facility], [address] [place], [place] [address]    Culture GRAM POSITIVE COCCI IN CLUSTERS  Final   Report Status PENDING  Incomplete  Blood Culture ID Panel (Reflexed)     Status: Abnormal   Collection Time: 07/26/23  8:38 PM  Result Value Ref Range Status   Enterococcus faecalis NOT DETECTED NOT DETECTED Final   Enterococcus Faecium NOT DETECTED NOT DETECTED Final   Listeria monocytogenes NOT DETECTED NOT DETECTED Final   Staphylococcus species DETECTED (A) NOT DETECTED Final    Comment: CRITICAL RESULT CALLED TO, READ BACK BY AND VERIFIED WITH: RN Darcella Cheshire [phone] @ 2123 FH    Staphylococcus aureus (BCID) NOT DETECTED NOT DETECTED Final   Staphylococcus epidermidis DETECTED (A) NOT DETECTED Final    Comment: Methicillin (oxacillin) resistant coagulase negative staphylococcus. Possible blood culture contaminant (unless isolated from more than one blood culture draw or clinical case suggests  pathogenicity). No antibiotic treatment is indicated for blood  culture contaminants. CRITICAL RESULT CALLED TO, READ BACK BY AND VERIFIED WITH: RN Darcella Cheshire [phone] @ 2123 FH    Staphylococcus lugdunensis NOT DETECTED NOT DETECTED Final   Streptococcus species NOT DETECTED NOT DETECTED Final   Streptococcus agalactiae NOT DETECTED NOT DETECTED Final   Streptococcus pneumoniae NOT DETECTED NOT DETECTED Final  Streptococcus pyogenes NOT DETECTED NOT DETECTED Final   A.calcoaceticus-baumannii NOT DETECTED NOT DETECTED Final   Bacteroides fragilis NOT DETECTED NOT DETECTED Final   Enterobacterales NOT DETECTED NOT DETECTED Final   Enterobacter cloacae complex NOT DETECTED NOT DETECTED Final   Escherichia coli NOT DETECTED NOT DETECTED Final   Klebsiella aerogenes NOT DETECTED NOT DETECTED Final   Klebsiella oxytoca NOT DETECTED NOT DETECTED Final   Klebsiella pneumoniae NOT DETECTED NOT DETECTED Final   Proteus species NOT DETECTED NOT DETECTED Final   Salmonella species NOT DETECTED NOT DETECTED Final   Serratia marcescens NOT DETECTED NOT DETECTED Final   Haemophilus influenzae NOT DETECTED NOT DETECTED Final   Neisseria meningitidis NOT DETECTED NOT DETECTED Final   Pseudomonas aeruginosa NOT DETECTED NOT DETECTED Final   Stenotrophomonas maltophilia NOT DETECTED NOT DETECTED Final   Candida albicans NOT DETECTED NOT DETECTED Final   Candida auris NOT DETECTED NOT DETECTED Final   Candida glabrata NOT DETECTED NOT DETECTED Final   Candida krusei NOT DETECTED NOT DETECTED Final   Candida parapsilosis NOT DETECTED NOT DETECTED Final   Candida tropicalis NOT DETECTED NOT DETECTED Final   Cryptococcus neoformans/gattii NOT DETECTED NOT DETECTED Final   Methicillin resistance mecA/C DETECTED (A) NOT DETECTED Final    Comment: CRITICAL RESULT CALLED TO, READ BACK BY AND VERIFIED WITH: RN Darcella Cheshire [phone] @ 2123 FH Performed at [facility], [address], [place], [place]  [address]      Radiological Exams on Admission: CT Renal Stone Study Result Date: 07/26/2023 CLINICAL DATA:  Back pain, hypertension, urolithiasis EXAM: CT ABDOMEN AND PELVIS WITHOUT CONTRAST TECHNIQUE: Multidetector CT imaging of the abdomen and pelvis was performed following the standard protocol without IV contrast. RADIATION DOSE REDUCTION: This exam was performed according to the departmental dose-optimization program which includes automated exposure control, adjustment of the mA and/or kV according to patient size and/or use of iterative reconstruction technique. COMPARISON:  12/15/2021 FINDINGS: Lower chest: Chronic rounded atelectasis within the right lower lobe, with associated pleural thickening. No acute pleural or parenchymal lung disease. Hepatobiliary: Unremarkable unenhanced appearance of the liver and gallbladder. Pancreas: Unremarkable unenhanced appearance. Spleen: Unremarkable unenhanced appearance. Adrenals/Urinary Tract: Stable bilateral renal cortical cysts which do not require specific imaging follow-up. No urinary tract calculi or obstructive uropathy within either kidney. Trabeculated distended bladder consistent with chronic bladder outlet obstruction. The adrenals are unremarkable. Stomach/Bowel: No bowel obstruction or ileus. Normal appendix right lower quadrant. Extensive diverticulosis throughout the colon without evidence of acute diverticulitis. No bowel wall thickening or inflammatory change. Vascular/Lymphatic: Aortic atherosclerosis. No enlarged abdominal or pelvic lymph nodes. Reproductive: Stable enlarged prostate. Other: No free fluid or free intraperitoneal gas. Postsurgical changes from prior right inguinal hernia repair, with small residual or recurrent fat containing right inguinal hernia. No bowel herniation. Musculoskeletal: No acute or destructive bony abnormalities. Reconstructed images demonstrate no additional findings. IMPRESSION: 1. No evidence of urinary tract  calculi or obstructive uropathy. 2. Diffuse colonic diverticulosis without diverticulitis. 3. Enlarged prostate, with stable findings of chronic bladder outlet obstruction. 4.  Aortic Atherosclerosis (ICD10-I70.0). 5. Stable small fat containing right inguinal hernia. 6. Stable right lower lobe rounded atelectasis and overlying pleural thickening. Electronically Signed   By: Sharlet Salina M.D.   On: 07/26/2023 17:05   DG Chest Port 1 View Result Date: 07/26/2023 CLINICAL DATA:  Altered mental status EXAM: PORTABLE CHEST 1 VIEW COMPARISON:  12/16/2021 FINDINGS: The heart size and mediastinal contours are within normal limits. Bandlike scarring of the  right lung base. No acute airspace opacity. Large calcified loose bodies in the glenohumeral joints. IMPRESSION: Bandlike scarring of the right lung base. No acute airspace opacity. Electronically Signed   By: Jearld Lesch M.D.   On: 07/26/2023 16:33   CT Head Wo Contrast Result Date: 07/26/2023 CLINICAL DATA:  Mental status change, urinary frequency for 4 days, intermittent confusion with aggression EXAM: CT HEAD WITHOUT CONTRAST TECHNIQUE: Contiguous axial images were obtained from the base of the skull through the vertex without intravenous contrast. RADIATION DOSE REDUCTION: This exam was performed according to the departmental dose-optimization program which includes automated exposure control, adjustment of the mA and/or kV according to patient size and/or use of iterative reconstruction technique. COMPARISON:  09/08/2020 FINDINGS: Brain: No intracranial hemorrhage, mass effect, or evidence of acute infarct. No hydrocephalus. No extra-axial fluid collection. Age-commensurate cerebral atrophy and chronic small vessel ischemic disease. Chronic bilateral basal ganglia infarcts. Vascular: No hyperdense vessel. Intracranial arterial calcification. Skull: No fracture or focal lesion. Sinuses/Orbits: No acute finding. Other: None. IMPRESSION: No acute intracranial  abnormality. Electronically Signed   By: Minerva Fester M.D.   On: 07/26/2023 14:09    Assessment/Plan Present on Admission:  Acute renal failure superimposed on stage 3b chronic kidney disease (HCC) -IV fluid hydration. -BMP in a.m.   Generalized weakness -PT/OT consult -SNF versus home PT at discharge   T2DM -Sliding scale insulin   Essential hypertension -Resume Coreg twice daily  Shadonna Benedick 07/28/2023, 1:28 AM

## 2023-07-28 NOTE — Progress Notes (Signed)
The patient is a 89 yr old man who presented to [facility] ED from home which he shares with his son after he was called regarding a positive blood culture. The patient had been discharged from [facility] earlier today after a 2 day stay for agitation, urinary symptoms, and confusion. The patient is very weak and has only been able to walk a few feet with two person assist in the last few days.   Upon further examination it seems that the patient's blood culture was positive for staph epidermitis/MRSE. Repeat blood cultures were drawn upon presentation. That has grown out MSSE. It seems that both results represent contamination. However his BMP demonstrates rising creatinine.   The patient was admitted by my colleague, Dr. Joneen Roach early this morning.  At the time of admission the patient was minimally responsive. He was fully awake and alert upon my examination. He had no new complaints, but did complain about his weakness. He was found to be in acute on chronic kidney disease 3b and generalized weakness.   The patient is receiving IV fluids. Chemistry this morning revealed minimally improved creatinine. Will also have patient evaluated by PT/OT. It is possible he may require short term rehab after this admission.

## 2023-07-28 NOTE — Evaluation (Signed)
Occupational Therapy Evaluation Patient Details Name: Aaron Landry MRN: [id number] DOB: 11-Nov-1924 Today's Date: 07/28/2023   History of Present Illness   History of Present Illness: 89 yo male presents to the hospital on 2/10 with abnormal labs. Patient was admitted with acute renal failure, dehydration. PMH: CKD III, DM II, HTN     Clinical Impressions Patient is a 89 year old male who was admitted for above. Patient was living at home with son per patient report ( initially reported that he lives with his father). Patient was min A for standing balance with poor safety awareness with RW with mod A for LB Dressing tasks. Patient was noted to have decreased functional activity tolerance, decreased endurance, decreased standing balance, decreased safety awareness, and decreased knowledge of AD/AE impacting participation in ADLs. Plan for patient to dc home with son and [facility] services.      If plan is discharge home, recommend the following:   A little help with walking and/or transfers;A little help with bathing/dressing/bathroom;Assistance with cooking/housework;Direct supervision/assist for medications management;Assist for transportation;Help with stairs or ramp for entrance;Direct supervision/assist for financial management;Supervision due to cognitive status     Functional Status Assessment   Patient has had a recent decline in their functional status and demonstrates the ability to make significant improvements in function in a reasonable and predictable amount of time.     Equipment Recommendations   None recommended by OT      Precautions/Restrictions   Precautions Precautions: Fall Restrictions Weight Bearing Restrictions Per Provider Order: No     Mobility Bed Mobility Overal bed mobility: Needs Assistance Bed Mobility: Supine to Sit     Supine to sit: Contact guard     General bed mobility comments: with increased time          Balance Overall  balance assessment: Mild deficits observed, not formally tested             ADL either performed or assessed with clinical judgement   ADL Overall ADL's : Needs assistance/impaired   Eating/Feeding Details (indicate cue type and reason): no diet order nurse messaged Grooming: Sitting;Set up   Upper Body Bathing: Sitting;Minimal assistance   Lower Body Bathing: Sitting/lateral leans;Maximal assistance   Upper Body Dressing : Sitting;Minimal assistance   Lower Body Dressing: Sitting/lateral leans;Maximal assistance   Toilet Transfer: Minimal assistance;Ambulation;Rolling walker (2 wheels) Toilet Transfer Details (indicate cue type and reason): unsteady with RW Toileting- Clothing Manipulation and Hygiene: Sit to/from stand;Moderate assistance Toileting - Clothing Manipulation Details (indicate cue type and reason): clothes and hygiene             Vision   Vision Assessment?: No apparent visual deficits            Pertinent Vitals/Pain Pain Assessment Pain Assessment: No/denies pain     Extremity/Trunk Assessment Upper Extremity Assessment Upper Extremity Assessment: Overall WFL for tasks assessed   Lower Extremity Assessment Lower Extremity Assessment: Defer to PT evaluation   Cervical / Trunk Assessment Cervical / Trunk Assessment: Kyphotic   Communication Communication Communication: Impaired   Cognition Arousal: Alert Behavior During Therapy: Flat affect Cognition: Cognition impaired   Orientation impairments: Person, Place Awareness: Online awareness impaired, Intellectual awareness impaired Memory impairment (select all impairments): Short-term memory Attention impairment (select first level of impairment): Sustained attention Executive functioning impairment (select all impairments): Initiation  Home Living Family/patient expects to be discharged to:: Private residence Living Arrangements:  Other (Comment) (reported living with "father") Available Help at Discharge: Family Type of Home: House Home Access: Level entry     Home Layout: Laundry or work area in basement;One level     Bathroom Shower/Tub: Tub/shower unit         Home Equipment: Cane - single point          Prior Functioning/Environment Prior Level of Function : Independent/Modified Independent                    OT Problem List: Decreased activity tolerance;Impaired balance (sitting and/or standing);Decreased coordination;Decreased safety awareness;Decreased knowledge of precautions;Decreased knowledge of use of DME or AE   OT Treatment/Interventions: Self-care/ADL training;Energy conservation;Patient/family education;Therapeutic activities;Therapeutic exercise;Balance training      OT Goals(Current goals can be found in the care plan section)   Acute Rehab OT Goals Patient Stated Goal: to go home OT Goal Formulation: With patient Time For Goal Achievement: 08/11/23 Potential to Achieve Goals: Fair   OT Frequency:  Min 1X/week    Co-evaluation PT/OT/SLP Co-Evaluation/Treatment: Yes Reason for Co-Treatment: To address functional/ADL transfers PT goals addressed during session: Mobility/safety with mobility OT goals addressed during session: ADL's and self-care      AM-PAC OT "6 Clicks" Daily Activity     Outcome Measure Help from another person eating meals?: Total (NPO?) Help from another person taking care of personal grooming?: A Little Help from another person toileting, which includes using toliet, bedpan, or urinal?: A Little Help from another person bathing (including washing, rinsing, drying)?: A Little Help from another person to put on and taking off regular upper body clothing?: A Little Help from another person to put on and taking off regular lower body clothing?: A Lot 6 Click Score: 15   End of Session Equipment Utilized During Treatment: Gait belt;Rolling walker  (2 wheels) Nurse Communication: Other (comment) (secure chat about patients diet order.)  Activity Tolerance: Patient tolerated treatment well Patient left: in bed;with call bell/phone within reach (in ED)  OT Visit Diagnosis: Unsteadiness on feet (R26.81);Other abnormalities of gait and mobility (R26.89);Muscle weakness (generalized) (M62.81)                Time: 1610-    Charges:  OT General Charges $OT Visit: 1 Visit OT Evaluation $OT Eval Low Complexity: 1 Low  Makenah Karas OTR/L, MS Acute Rehabilitation Department Office# [phone]   Selinda Flavin 07/28/2023, 1:26 PM

## 2023-07-28 NOTE — Progress Notes (Signed)
PHARMACY - PHYSICIAN COMMUNICATION CRITICAL VALUE ALERT - BLOOD CULTURE IDENTIFICATION (BCID)  Aaron Landry is an 89 y.o. male who presented to [facility] on 07/27/2023 after being discharged earlier on 2/10.  He was called to return for positive blood cultures although later deemed to be a contaminant.  He is admitted for acute renal failure and weakness.  Micro: 2/9 Resp panel: neg covid/flu/rsv 2/9 UCx: ngf 2/9 BCx: 2/3 bottles (same set) Staphylococcus epidermidis (BCID: MRSE)  2/11 BCx: 1/4 bottles GPC 2/11 BCID: Staphylococcus epidermidis, methicillin resistance not detected  Assessment:   WBC WNL, Afebrile   Name of physician (or Provider) Contacted: Dr Gerri Lins  Current antibiotics: None  Changes to prescribed antibiotics recommended:   No changes needed  Results for orders placed or performed during the hospital encounter of 07/27/23  Blood Culture ID Panel (Reflexed) (Collected: 07/28/2023 12:20 AM)  Result Value Ref Range   Enterococcus faecalis NOT DETECTED NOT DETECTED   Enterococcus Faecium NOT DETECTED NOT DETECTED   Listeria monocytogenes NOT DETECTED NOT DETECTED   Staphylococcus species DETECTED (A) NOT DETECTED   Staphylococcus aureus (BCID) NOT DETECTED NOT DETECTED   Staphylococcus epidermidis DETECTED (A) NOT DETECTED   Staphylococcus lugdunensis NOT DETECTED NOT DETECTED   Streptococcus species NOT DETECTED NOT DETECTED   Streptococcus agalactiae NOT DETECTED NOT DETECTED   Streptococcus pneumoniae NOT DETECTED NOT DETECTED   Streptococcus pyogenes NOT DETECTED NOT DETECTED   A.calcoaceticus-baumannii NOT DETECTED NOT DETECTED   Bacteroides fragilis NOT DETECTED NOT DETECTED   Enterobacterales NOT DETECTED NOT DETECTED   Enterobacter cloacae complex NOT DETECTED NOT DETECTED   Escherichia coli NOT DETECTED NOT DETECTED   Klebsiella aerogenes NOT DETECTED NOT DETECTED   Klebsiella oxytoca NOT DETECTED NOT DETECTED   Klebsiella pneumoniae NOT DETECTED  NOT DETECTED   Proteus species NOT DETECTED NOT DETECTED   Salmonella species NOT DETECTED NOT DETECTED   Serratia marcescens NOT DETECTED NOT DETECTED   Haemophilus influenzae NOT DETECTED NOT DETECTED   Neisseria meningitidis NOT DETECTED NOT DETECTED   Pseudomonas aeruginosa NOT DETECTED NOT DETECTED   Stenotrophomonas maltophilia NOT DETECTED NOT DETECTED   Candida albicans NOT DETECTED NOT DETECTED   Candida auris NOT DETECTED NOT DETECTED   Candida glabrata NOT DETECTED NOT DETECTED   Candida krusei NOT DETECTED NOT DETECTED   Candida parapsilosis NOT DETECTED NOT DETECTED   Candida tropicalis NOT DETECTED NOT DETECTED   Cryptococcus neoformans/gattii NOT DETECTED NOT DETECTED   Methicillin resistance mecA/C NOT DETECTED NOT DETECTED    Lynann Beaver PharmD, BCPS [facility] main pharmacy [phone] 07/28/2023 5:07 PM

## 2023-07-28 NOTE — Evaluation (Signed)
Clinical/Bedside Swallow Evaluation Patient Details  Name: Aaron Landry MRN: [id number] Date of Birth: 09/10/1924  Today's Date: 07/28/2023 Time: SLP Start Time (ACUTE ONLY): 1615 SLP Stop Time (ACUTE ONLY): 1640 SLP Time Calculation (min) (ACUTE ONLY): 25 min  Past Medical History:  Past Medical History:  Diagnosis Date   BPH (benign prostatic hyperplasia)    Chronic kidney disease    Diabetes mellitus    Hyperlipidemia    Hypertension    OA (ocular albinism) (HCC)    Pleural effusion    Renal insufficiency    TIA (transient ischemic attack)    Past Surgical History:  Past Surgical History:  Procedure Laterality Date   COLON SURGERY  2011   HERNIA REPAIR     LIVER BIOPSY     HPI:  89 yo male presents to the hospital on 2/10 with abnormal labs, confusion, frequency of urination, UTI.  Patient was admitted with acute renal failure, dehydration. PMH: CKD III, DM II, HTN, TIA, GERD, esophagitis, liver lesions.  Weak walked with cane before,   He is eating near his baseline but does not eat much at baseline. 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 07/25/2022  CT abdomen 07/27/2023 Stable right lower lobe rounded atelectasis and overlying pleural thickening, diverticulosis not diverticulitis.    Assessment / Plan / Recommendation  Clinical Impression  Patient greeted fully alert, sitting upright in bed, and stating he is hungry.  He reports there are 2 sons in the room with him.  Notable for right facial asymmetry otherwise CN exam unremarkable.  Looking at prior pictures on son's phone, the facial asymmetry is baseline.  Pt easily passed a 3 ounce Yale swallow screen.  Easily consumed sandwich, applesauce, juice 4 ounces.  Swallow judged to be timely per clinical observation with no indication of oral pharyngeal retention or sign of aspiration.  Son reports his father may be coughs with something proximately once a month but  tolerates his diet well.  Patient also reports he exercises frequently at a gym, has maintained his weight overall and denies having recurrent pulmonary infections.  Recommend regular/thin diet with general precautions.  No SLP follow-up indicated. SLP Visit Diagnosis: Dysphagia, unspecified (R13.10)    Aspiration Risk  Mild aspiration risk    Diet Recommendation Regular;Thin liquid    Liquid Administration via: Cup;Straw Medication Administration: Whole meds with liquid Supervision: Patient able to self feed Compensations: Slow rate;Small sips/bites Postural Changes: Seated upright at 90 degrees;Remain upright for at least 30 minutes after po intake    Other  Recommendations Oral Care Recommendations: Oral care BID    Recommendations for follow up therapy are one component of a multi-disciplinary discharge planning process, led by the attending physician.  Recommendations may be updated based on patient status, additional functional criteria and insurance authorization.  Follow up Recommendations No SLP follow up      Assistance Recommended at Discharge  N/a  Functional Status Assessment Patient has not had a recent decline in their functional status  Frequency and Duration            Prognosis        Swallow Study   General Date of Onset: 07/28/23 HPI: 89 yo male presents to the hospital on 2/10 with abnormal labs, confusion, frequency of urination, UTI.  Patient was admitted with acute renal failure, dehydration. PMH: CKD III, DM II, HTN, TIA, GERD, esophagitis, liver lesions.  Weak walked with cane  before,   He is eating near his baseline but does not eat much at baseline. 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 07/25/2022  CT abdomen 07/27/2023 Stable right lower lobe rounded atelectasis and overlying pleural thickening, diverticulosis not diverticulitis. Type of Study: Bedside Swallow Evaluation Diet Prior to  this Study: Regular;Thin liquids (Level 0) Temperature Spikes Noted: No Respiratory Status: Room air History of Recent Intubation: No Behavior/Cognition: Alert;Cooperative;Pleasant mood Oral Cavity Assessment: Within Functional Limits Oral Care Completed by SLP: Yes Oral Cavity - Dentition: Adequate natural dentition;Other (Comment) (Some missing upper left) Vision: Functional for self-feeding Self-Feeding Abilities: Able to feed self Patient Positioning: Upright in bed Baseline Vocal Quality: Normal Volitional Cough: Strong Volitional Swallow: Able to elicit    Oral/Motor/Sensory Function Overall Oral Motor/Sensory Function: Mild impairment Facial ROM: Reduced right Facial Symmetry: Abnormal symmetry right Facial Strength: Within Functional Limits Facial Sensation: Within Functional Limits Lingual ROM: Within Functional Limits Lingual Symmetry: Within Functional Limits Lingual Strength: Within Functional Limits Lingual Sensation: Within Functional Limits Velum: Within Functional Limits Mandible: Within Functional Limits   Ice Chips Ice chips: Not tested   Thin Liquid Thin Liquid: Within functional limits Presentation: Self Fed;Cup    Nectar Thick Nectar Thick Liquid: Not tested   Honey Thick Honey Thick Liquid: Not tested   Puree Puree: Within functional limits Presentation: Self Fed;Spoon   Solid     Solid: Within functional limits Presentation: Self Orvan July 07/28/2023,6:48 PM

## 2023-07-29 DIAGNOSIS — N189 Chronic kidney disease, unspecified: Secondary | ICD-10-CM | POA: Diagnosis not present

## 2023-07-29 DIAGNOSIS — N179 Acute kidney failure, unspecified: Secondary | ICD-10-CM | POA: Diagnosis not present

## 2023-07-29 LAB — CBC WITH DIFFERENTIAL/PLATELET
Abs Immature Granulocytes: 0.02 10*3/uL (ref 0.00–0.07)
Basophils Absolute: 0.1 10*3/uL (ref 0.0–0.1)
Basophils Relative: 1 %
Eosinophils Absolute: 0.5 10*3/uL (ref 0.0–0.5)
Eosinophils Relative: 8 %
HCT: 30.7 % — ABNORMAL LOW (ref 39.0–52.0)
Hemoglobin: 10 g/dL — ABNORMAL LOW (ref 13.0–17.0)
Immature Granulocytes: 0 %
Lymphocytes Relative: 27 %
Lymphs Abs: 1.7 10*3/uL (ref 0.7–4.0)
MCH: 31.5 pg (ref 26.0–34.0)
MCHC: 32.6 g/dL (ref 30.0–36.0)
MCV: 96.8 fL (ref 80.0–100.0)
Monocytes Absolute: 0.6 10*3/uL (ref 0.1–1.0)
Monocytes Relative: 10 %
Neutro Abs: 3.5 10*3/uL (ref 1.7–7.7)
Neutrophils Relative %: 54 %
Platelets: 197 10*3/uL (ref 150–400)
RBC: 3.17 MIL/uL — ABNORMAL LOW (ref 4.22–5.81)
RDW: 13.1 % (ref 11.5–15.5)
WBC: 6.3 10*3/uL (ref 4.0–10.5)
nRBC: 0 % (ref 0.0–0.2)

## 2023-07-29 LAB — BASIC METABOLIC PANEL
Anion gap: 10 (ref 5–15)
BUN: 30 mg/dL — ABNORMAL HIGH (ref 8–23)
CO2: 18 mmol/L — ABNORMAL LOW (ref 22–32)
Calcium: 7.9 mg/dL — ABNORMAL LOW (ref 8.9–10.3)
Chloride: 107 mmol/L (ref 98–111)
Creatinine, Ser: 1.65 mg/dL — ABNORMAL HIGH (ref 0.61–1.24)
GFR, Estimated: 38 mL/min — ABNORMAL LOW (ref >60)
Glucose, Bld: 151 mg/dL — ABNORMAL HIGH (ref 70–99)
Potassium: 3.6 mmol/L (ref 3.5–5.1)
Sodium: 135 mmol/L (ref 135–145)

## 2023-07-29 LAB — URINALYSIS, W/ REFLEX TO CULTURE (INFECTION SUSPECTED)
Bilirubin Urine: NEGATIVE
Glucose, UA: NEGATIVE mg/dL
Ketones, ur: NEGATIVE mg/dL
Leukocytes,Ua: NEGATIVE
Nitrite: NEGATIVE
Protein, ur: NEGATIVE mg/dL
Specific Gravity, Urine: 1.008 (ref 1.005–1.030)
pH: 5 (ref 5.0–8.0)

## 2023-07-29 LAB — GLUCOSE, CAPILLARY
Glucose-Capillary: 147 mg/dL — ABNORMAL HIGH (ref 70–99)
Glucose-Capillary: 148 mg/dL — ABNORMAL HIGH (ref 70–99)
Glucose-Capillary: 152 mg/dL — ABNORMAL HIGH (ref 70–99)
Glucose-Capillary: 128 mg/dL — ABNORMAL HIGH (ref 70–99)

## 2023-07-29 MED ORDER — CARVEDILOL 3.125 MG PO TABS
3.1250 mg | ORAL_TABLET | Freq: Two times a day (BID) | ORAL | Status: DC
  Administered 2023-07-29 – 2023-07-31 (×4): 3.125 mg via ORAL
  Filled 2023-07-29 (×4): qty 1

## 2023-07-29 MED ORDER — TAMSULOSIN HCL 0.4 MG PO CAPS
0.4000 mg | ORAL_CAPSULE | Freq: Every day | ORAL | Status: DC
  Administered 2023-07-29 – 2023-07-30 (×2): 0.4 mg via ORAL
  Filled 2023-07-29 (×2): qty 1

## 2023-07-29 NOTE — TOC CM/SW Note (Signed)

## 2023-07-29 NOTE — TOC Initial Note (Signed)
Transition of Care [facility]) - Initial/Assessment Note    Patient Details  Name: Aaron Landry MRN: [id number] Date of Birth: 1925/04/22  [facility] CM/SW Contact:    Otelia Santee, LCSW Phone Number: 07/29/2023, 2:09 PM  Clinical Narrative:                 Met with pt and  confirmed recommendation for [facility]. Pt reports having HH services years ago and does not have preference of agency. HHPT/OT has been arranged with [facility]. HH orders will need to be placed prior to discharge. Pt has cane and RW at home and denies further DME needs. Provided pt's son with update.   Expected Discharge Plan: Home w Home Health Services Barriers to Discharge: Continued Medical Work up   Patient Goals and CMS Choice Patient states their goals for this hospitalization and ongoing recovery are:: To return home CMS Medicare.gov Compare Post Acute Care list provided to:: Patient Choice offered to / list presented to : Patient [place] ownership interest in [facility].provided to::  (NA)    Expected Discharge Plan and Services In-house Referral: Clinical Social Work Discharge Planning Services: NA Post Acute Care Choice: Home Health Living arrangements for the past 2 months: Single Family Home                 DME Arranged: N/A DME Agency: NA       HH Arranged: PT, OT HH Agency: [facility] Date HH Agency Contacted: 07/29/23 Time HH Agency Contacted: [id number] Representative spoke with at [facility] Agency: Clifton Custard  Prior Living Arrangements/Services Living arrangements for the past 2 months: Single Family Home Lives with:: Adult Children Patient language and need for interpreter reviewed:: Yes Do you feel safe going back to the place where you live?: Yes      Need for Family Participation in Patient Care: Yes (Comment) Care giver support system in place?: Yes (comment) Current home services: DME ([facility]w, cane) Criminal Activity/Legal Involvement Pertinent to Current  Situation/Hospitalization: No - Comment as needed  Activities of Daily Living   ADL Screening (condition at time of admission) Independently performs ADLs?: Yes (appropriate for developmental age) Is the patient deaf or have difficulty hearing?: No Does the patient have difficulty seeing, even when wearing glasses/contacts?: Yes Does the patient have difficulty concentrating, remembering, or making decisions?: No  Permission Sought/Granted Permission sought to share information with : Facility Medical sales representative, Family Supports Permission granted to share information with : Yes, Verbal Permission Granted  Share Information with NAME: Armas Mcbee  Permission granted to share info w AGENCY: [facility]  Permission granted to share info w Relationship: Son     Emotional Assessment Appearance:: Appears younger than stated age, Well-Groomed Attitude/Demeanor/Rapport: Engaged Affect (typically observed): Accepting Orientation: : Oriented to Self, Oriented to Place, Oriented to  Time, Oriented to Situation Alcohol / Substance Use: Not Applicable Psych Involvement: No (comment)  Admission diagnosis:  AKI (acute kidney injury) (HCC) [N17.9] Altered mental status, unspecified altered mental status type [R41.82] Acute kidney injury superimposed on chronic kidney disease (HCC) [N17.9, N18.9] Patient Active Problem List   Diagnosis Date Noted   T2DM (type 2 diabetes mellitus) (HCC) 07/28/2023   Altered mental status 07/28/2023   Acute kidney injury superimposed on chronic kidney disease (HCC) 07/28/2023   Hypertensive emergency 07/26/2023   E coli bacteremia 12/20/2021   Acute retention of urine 12/20/2021   History of anemia due to chronic kidney disease 12/20/2021   Uncontrolled type 2  diabetes mellitus with hyperglycemia, without long-term current use of insulin (HCC) 12/20/2021   Generalized weakness 12/20/2021   Hypocalcemia 12/20/2021   Bacteremia due to Escherichia coli  12/16/2021   Anemia of chronic disease 07/18/2021   Diabetic renal disease (HCC) 07/18/2021   Hyponatremia 07/18/2021   Leukocytosis 07/18/2021   Onychomycosis 07/18/2021   Acute metabolic encephalopathy 06/04/2020   Complicated UTI (urinary tract infection) 06/03/2020   Acute renal failure superimposed on stage 3b chronic kidney disease (HCC) 06/03/2020   Hypomagnesemia 06/03/2020   Essential hypertension 06/03/2020   GERD without esophagitis 06/03/2020   Chronic kidney disease due to hypertension 10/10/2019   Chronic kidney disease, stage 3b (HCC) 10/10/2019   Non-thrombocytopenic purpura (HCC) 11/10/2018   Pain in left knee 10/21/2017   Abnormal gait 01/28/2016   Encounter for general adult medical examination without abnormal findings 12/13/2014   Type 2 diabetes mellitus with stage 3b chronic kidney disease, without long-term current use of insulin (HCC)    Hyperkalemia 08/24/2014   Liver abscess 08/24/2014   Abscess, liver    Osteoarthritis 09/28/2013   Diabetic peripheral neuropathy associated with type 2 diabetes mellitus (HCC) 07/12/2012   Degeneration of lumbar intervertebral disc 06/15/2012   Peripheral vascular disease, unspecified (HCC) 01/27/2012   ED (erectile dysfunction) of organic origin 07/16/2011   Elevated prostate specific antigen (PSA) 07/16/2011   Benign prostatic hyperplasia with lower urinary tract symptoms 06/07/2009   Hyperlipidemia 06/07/2009   ABSCESS, LIVER, PYOGENIC 10/20/2008   Diabetes (HCC) 10/19/2008   HYPERTENSION NEC 10/19/2008   Dehydration with hyponatremia 10/08/2008   Hypokalemia, inadequate intake 10/08/2008   PCP:  Geoffry Paradise, MD Pharmacy:   [facility] # [address] [place] - [address] [place] [address] Phone: [phone] Fax: [phone]     Social Drivers of Health (SDOH) Social History: SDOH Screenings   Food Insecurity: No Food Insecurity (07/28/2023)  Housing: Unknown  (07/28/2023)  Transportation Needs: No Transportation Needs (07/28/2023)  Utilities: Not At Risk (07/28/2023)  Social Connections: Unknown (07/28/2023)  Tobacco Use: Medium Risk (07/26/2023)   SDOH Interventions:     Readmission Risk Interventions    12/18/2021   11:38 AM  Readmission Risk Prevention Plan  Transportation Screening Complete  PCP or Specialist Appt within 5-7 Days Complete  Home Care Screening Complete  Medication Review (RN CM) Complete

## 2023-07-29 NOTE — Progress Notes (Signed)
PROGRESS NOTE  Aaron Landry VHQ:[id number] DOB: 1924/08/25 DOA: 07/27/2023 PCP: Geoffry Paradise, MD   LOS: 0 days   Brief Narrative / Interim history: 89 year old male with history of BPH, CKD, DM2, HTN, HLD, prior TIA, memory issues comes into the hospital with positive blood cultures.  He was recently admitted 07/26/2023-discharged 2/10 for acute encephalopathy, unclear etiology but thought to may have been due to severe hypertension, improved and then discharged home, returns after being called with positive blood cultures.  He apparently has remained quite weak since being home  Subjective / 24h Interval events: Son is at bedside, reports significant weakness in the past few days.  Patient himself states that he does not feel good, mainly because he is unable to urinate  Assesement and Plan: Principal Problem:   Acute kidney injury superimposed on chronic kidney disease (HCC) Active Problems:   Acute renal failure superimposed on stage 3b chronic kidney disease (HCC)   Essential hypertension   Hyperlipidemia   Generalized weakness   T2DM (type 2 diabetes mellitus) (HCC)   Altered mental status  Principal problem Acute urinary retention, concern for UTI, underlying BPH-last time patient was hospitalized he was extremely confused, and per son, it got him concerned about him having a UTI.  Currently has acute urinary retention in the room, patient trying to urinate but unable to do so -Bladder scan shows only 300 cc, however underwent In-N-Out this morning with more than 800 cc output -Urinalysis fairly bland, rare bacteria but no clear-cut evidence of a UTI -Suspect related to BPH, start Flomax -Patient tells me he is unable to urinate while laying flat, continues to be quite weak, PT consult pending -CT scan 2/9 without calculi or obstructive uropathy, but it did show enlarged prostate  Active problems CKD stage 3b-baseline creatinine 1.5-1.8, currently at baseline.  Staph  epidermidis, Staph hominis positive blood cultures -1/4 bottles, afebrile, no leukocytosis, discussed with ID and this appears to be contaminant  Essential hypertension-continue Coreg  Chronic anemia-no bleeding, the setting of CKD  Acute metabolic encephalopathy -suspect mild underlying cognitive impairment, patient alert to place, but not year or president.  Knows it is February.  Continue supportive care  Dysarthria-noticed by son, MRI done yesterday was without acute findings  Scheduled Meds:  enoxaparin (LOVENOX) injection  30 mg Subcutaneous Q24H   insulin aspart  0-5 Units Subcutaneous QHS   insulin aspart  0-9 Units Subcutaneous TID WC   Continuous Infusions:  dextrose 5 % and 0.45 % NaCl 100 mL/hr at 07/29/23 0349   PRN Meds:.acetaminophen **OR** acetaminophen, hydrALAZINE  Current Outpatient Medications  Medication Instructions   Aspirin-Salicylamide-Caffeine (BC HEADACHE PO) 1 packet, 2 times daily PRN   carvedilol (COREG) 3.125 mg, Oral, 2 times daily with meals    Diet Orders (From admission, onward)     Start     Ordered   07/28/23 1644  Diet regular Room service appropriate? Yes with Assist; Fluid consistency: Thin  Diet effective now       Question Answer Comment  Room service appropriate? Yes with Assist   Fluid consistency: Thin      07/28/23 1644            DVT prophylaxis: enoxaparin (LOVENOX) injection 30 mg Start: 07/28/23 1000   Lab Results  Component Value Date   PLT 197 07/29/2023      Code Status: Full Code  Family Communication: son at bedside   Status is: Observation The patient will require care spanning > 2  midnights and should be moved to inpatient because: Encephalopathy, significant weakness, acute urinary retention  Level of care: Med-Surg  Consultants:  none  Objective: Vitals:   07/28/23 1549 07/28/23 1953 07/28/23 2322 07/29/23 0319  BP: (!) 147/78 (!) 159/62 (!) 121/53 (!) 145/74  Pulse: 64 77 67 69  Resp:  18 18  18   Temp: 97.9 F (36.6 C) 97.8 F (36.6 C) 98.4 F (36.9 C) 98.3 F (36.8 C)  TempSrc:      SpO2: 100% 94% 97% 99%    Intake/Output Summary (Last 24 hours) at 07/29/2023 1155 Last data filed at 07/29/2023 0900 Gross per 24 hour  Intake 2412.81 ml  Output 1465 ml  Net 947.81 ml   Wt Readings from Last 3 Encounters:  07/26/23 62.3 kg  12/22/21 62.3 kg  12/14/21 63.5 kg    Examination:  Constitutional: NAD Eyes: no scleral icterus ENMT: Mucous membranes are moist.  Neck: normal, supple Respiratory: clear to auscultation bilaterally, no wheezing, no crackles. Normal respiratory effort. No accessory muscle use.  Cardiovascular: Regular rate and rhythm, no murmurs / rubs / gallops. No LE edema.  Abdomen: non distended, no tenderness. Bowel sounds positive.  Musculoskeletal: no clubbing / cyanosis.   Data Reviewed: I have independently reviewed following labs and imaging studies   CBC Recent Labs  Lab 07/26/23 1344 07/26/23 1958 07/27/23 0802 07/28/23 0010 07/28/23 0607 07/29/23 0556  WBC 7.9 10.9* 7.4 7.7 7.1 6.3  HGB 12.4* 12.8* 11.8* 11.5* 12.2* 10.0*  HCT 36.9* 37.9* 35.9* 34.8* 37.0* 30.7*  PLT 252 232 238 223 225 197  MCV 93.9 92.9 92.5 93.3 93.2 96.8  MCH 31.6 31.4 30.4 30.8 30.7 31.5  MCHC 33.6 33.8 32.9 33.0 33.0 32.6  RDW 13.0 13.0 13.2 13.2 13.2 13.1  LYMPHSABS 2.0  --   --  1.5 1.8 1.7  MONOABS 0.6  --   --  0.7 0.5 0.6  EOSABS 0.6*  --   --  0.5 0.4 0.5  BASOSABS 0.1  --   --  0.1 0.1 0.1    Recent Labs  Lab 07/26/23 1344 07/26/23 1826 07/27/23 0802 07/28/23 0010 07/28/23 0607 07/29/23 0556  NA 136  --  134* 135 137 135  K 4.2  --  4.2 3.9 3.7 3.6  CL 103  --  102 104 106 107  CO2 21*  --  21* 21* 20* 18*  GLUCOSE 135*  --  110* 173* 129* 151*  BUN 35*  --  34* 38* 36* 30*  CREATININE 1.71* 1.54* 1.58* 1.91* 1.78* 1.65*  CALCIUM 9.0  --  8.7* 8.7* 8.4* 7.9*  AST 25  --   --  26  --   --   ALT 15  --   --  15  --   --   ALKPHOS 92  --    --  80  --   --   BILITOT 1.0  --   --  1.0  --   --   ALBUMIN 3.6  --   --  3.4*  --   --   HGBA1C  --   --   --   --  7.0*  --     ------------------------------------------------------------------------------------------------------------------ No results for input(s): "CHOL", "HDL", "LDLCALC", "TRIG", "CHOLHDL", "LDLDIRECT" in the last 72 hours.  Lab Results  Component Value Date   HGBA1C 7.0 (H) 07/28/2023   ------------------------------------------------------------------------------------------------------------------ No results for input(s): "TSH", "T4TOTAL", "T3FREE", "THYROIDAB" in the last 72 hours.  Invalid input(s): "FREET3"  Cardiac Enzymes No results for input(s): "CKMB", "TROPONINI", "MYOGLOBIN" in the last 168 hours.  Invalid input(s): "CK" ------------------------------------------------------------------------------------------------------------------ No results found for: "BNP"  CBG: Recent Labs  Lab 07/28/23 1431 07/28/23 1735 07/28/23 2137 07/29/23 0738 07/29/23 1116  GLUCAP 152* 180* 182* 147* 148*    Recent Results (from the past 240 hours)  Resp panel by RT-PCR (RSV, Flu A&B, Covid) Anterior Nasal Swab     Status: None   Collection Time: 07/26/23  4:06 PM   Specimen: Anterior Nasal Swab  Result Value Ref Range Status   SARS Coronavirus 2 by RT PCR NEGATIVE NEGATIVE Final    Comment: (NOTE) SARS-CoV-2 target nucleic acids are NOT DETECTED.  The SARS-CoV-2 RNA is generally detectable in upper respiratory specimens during the acute phase of infection. The lowest concentration of SARS-CoV-2 viral copies this assay can detect is 138 copies/mL. A negative result does not preclude SARS-Cov-2 infection and should not be used as the sole basis for treatment or other patient management decisions. A negative result may occur with  improper specimen collection/handling, submission of specimen other than nasopharyngeal swab, presence of viral  mutation(s) within the areas targeted by this assay, and inadequate number of viral copies(<138 copies/mL). A negative result must be combined with clinical observations, patient history, and epidemiological information. The expected result is Negative.  Fact Sheet for Patients:  BloggerCourse.com  Fact Sheet for Healthcare Providers:  SeriousBroker.it  This test is no t yet approved or cleared by the [place] FDA and  has been authorized for detection and/or diagnosis of SARS-CoV-2 by FDA under an Emergency Use Authorization (EUA). This EUA will remain  in effect (meaning this test can be used) for the duration of the COVID-19 declaration under Section 564(b)(1) of the Act, 21 U.S.C.section 360bbb-3(b)(1), unless the authorization is terminated  or revoked sooner.       Influenza A by PCR NEGATIVE NEGATIVE Final   Influenza B by PCR NEGATIVE NEGATIVE Final    Comment: (NOTE) The Xpert Xpress SARS-CoV-2/FLU/RSV plus assay is intended as an aid in the diagnosis of influenza from Nasopharyngeal swab specimens and should not be used as a sole basis for treatment. Nasal washings and aspirates are unacceptable for Xpert Xpress SARS-CoV-2/FLU/RSV testing.  Fact Sheet for Patients: BloggerCourse.com  Fact Sheet for Healthcare Providers: SeriousBroker.it  This test is not yet approved or cleared by the [place] FDA and has been authorized for detection and/or diagnosis of SARS-CoV-2 by FDA under an Emergency Use Authorization (EUA). This EUA will remain in effect (meaning this test can be used) for the duration of the COVID-19 declaration under Section 564(b)(1) of the Act, 21 U.S.C. section 360bbb-3(b)(1), unless the authorization is terminated or revoked.     Resp Syncytial Virus by PCR NEGATIVE NEGATIVE Final    Comment: (NOTE) Fact Sheet for  Patients: BloggerCourse.com  Fact Sheet for Healthcare Providers: SeriousBroker.it  This test is not yet approved or cleared by the [place] FDA and has been authorized for detection and/or diagnosis of SARS-CoV-2 by FDA under an Emergency Use Authorization (EUA). This EUA will remain in effect (meaning this test can be used) for the duration of the COVID-19 declaration under Section 564(b)(1) of the Act, 21 U.S.C. section 360bbb-3(b)(1), unless the authorization is terminated or revoked.  Performed at [facility], [address], [place], [place] [address]   Urine Culture     Status: None   Collection Time: 07/26/23  4:55 PM   Specimen: Urine, Clean  Catch  Result Value Ref Range Status   Specimen Description   Final    URINE, CLEAN CATCH Performed at [facility], [address], [place], [place] [address]    Special Requests   Final    NONE Performed at [facility], [address], [place], [place] [address]    Culture   Final    NO GROWTH Performed at [facility], [address] [place], [place] [address]    Report Status 07/27/2023 FINAL  Final  Culture, blood (Routine X 2) w Reflex to ID Panel     Status: None (Preliminary result)   Collection Time: 07/26/23  8:33 PM   Specimen: BLOOD  Result Value Ref Range Status   Specimen Description   Final    BLOOD BLOOD LEFT WRIST Performed at [facility], [address] [place], [place] [address]    Special Requests   Final    BOTTLES DRAWN AEROBIC ONLY Blood Culture results may not be optimal due to an inadequate volume of blood received in culture bottles Performed at [facility], [address] [place], [place] [address]    Culture   Final    NO GROWTH 2 DAYS Performed at [facility], [address], [place], [place] [address]    Report Status PENDING   Incomplete  Culture, blood (Routine X 2) w Reflex to ID Panel     Status: Abnormal (Preliminary result)   Collection Time: 07/26/23  8:38 PM   Specimen: BLOOD RIGHT ARM  Result Value Ref Range Status   Specimen Description   Final    BLOOD RIGHT ARM Performed at [facility], [address], [place], [place] [address]    Special Requests   Final    BOTTLES DRAWN AEROBIC AND ANAEROBIC Blood Culture results may not be optimal due to an inadequate volume of blood received in culture bottles Performed at [facility], [address], [place], [place] [address]    Culture  Setup Time   Final    GRAM POSITIVE COCCI IN CLUSTERS IN BOTH AEROBIC AND ANAEROBIC BOTTLES CRITICAL RESULT CALLED TO, READ BACK BY AND VERIFIED WITH: RN Darcella Cheshire [phone] @ 2123 FH Performed at [facility], [address] [place], [place] [address]    Culture STAPHYLOCOCCUS EPIDERMIDIS (A)  Final   Report Status PENDING  Incomplete  Blood Culture ID Panel (Reflexed)     Status: Abnormal   Collection Time: 07/26/23  8:38 PM  Result Value Ref Range Status   Enterococcus faecalis NOT DETECTED NOT DETECTED Final   Enterococcus Faecium NOT DETECTED NOT DETECTED Final   Listeria monocytogenes NOT DETECTED NOT DETECTED Final   Staphylococcus species DETECTED (A) NOT DETECTED Final    Comment: CRITICAL RESULT CALLED TO, READ BACK BY AND VERIFIED WITH: RN Darcella Cheshire [phone] @ 2123 FH    Staphylococcus aureus (BCID) NOT DETECTED NOT DETECTED Final   Staphylococcus epidermidis DETECTED (A) NOT DETECTED Final    Comment: Methicillin (oxacillin) resistant coagulase negative staphylococcus. Possible blood culture contaminant (unless isolated from more than one blood culture draw or clinical case suggests pathogenicity). No antibiotic treatment is indicated for blood  culture contaminants. CRITICAL RESULT CALLED TO, READ BACK BY AND VERIFIED WITH: RN Darcella Cheshire [phone] @ 2123 FH    Staphylococcus  lugdunensis NOT DETECTED NOT DETECTED Final   Streptococcus species NOT DETECTED NOT DETECTED Final  Streptococcus agalactiae NOT DETECTED NOT DETECTED Final   Streptococcus pneumoniae NOT DETECTED NOT DETECTED Final   Streptococcus pyogenes NOT DETECTED NOT DETECTED Final   A.calcoaceticus-baumannii NOT DETECTED NOT DETECTED Final   Bacteroides fragilis NOT DETECTED NOT DETECTED Final   Enterobacterales NOT DETECTED NOT DETECTED Final   Enterobacter cloacae complex NOT DETECTED NOT DETECTED Final   Escherichia coli NOT DETECTED NOT DETECTED Final   Klebsiella aerogenes NOT DETECTED NOT DETECTED Final   Klebsiella oxytoca NOT DETECTED NOT DETECTED Final   Klebsiella pneumoniae NOT DETECTED NOT DETECTED Final   Proteus species NOT DETECTED NOT DETECTED Final   Salmonella species NOT DETECTED NOT DETECTED Final   Serratia marcescens NOT DETECTED NOT DETECTED Final   Haemophilus influenzae NOT DETECTED NOT DETECTED Final   Neisseria meningitidis NOT DETECTED NOT DETECTED Final   Pseudomonas aeruginosa NOT DETECTED NOT DETECTED Final   Stenotrophomonas maltophilia NOT DETECTED NOT DETECTED Final   Candida albicans NOT DETECTED NOT DETECTED Final   Candida auris NOT DETECTED NOT DETECTED Final   Candida glabrata NOT DETECTED NOT DETECTED Final   Candida krusei NOT DETECTED NOT DETECTED Final   Candida parapsilosis NOT DETECTED NOT DETECTED Final   Candida tropicalis NOT DETECTED NOT DETECTED Final   Cryptococcus neoformans/gattii NOT DETECTED NOT DETECTED Final   Methicillin resistance mecA/C DETECTED (A) NOT DETECTED Final    Comment: CRITICAL RESULT CALLED TO, READ BACK BY AND VERIFIED WITH: RN Darcella Cheshire [phone] @ 2123 FH Performed at [facility], [address], [place], [place] [address]   Culture, blood (routine x 2)     Status: None (Preliminary result)   Collection Time: 07/28/23 12:10 AM   Specimen: BLOOD RIGHT FOREARM  Result Value Ref Range Status   Specimen  Description   Final    BLOOD RIGHT FOREARM Performed at [facility], [address], [place], [place] [address]    Special Requests   Final    BOTTLES DRAWN AEROBIC AND ANAEROBIC Blood Culture adequate volume Performed at [facility], [address], [place], [place] [address]    Culture   Final    NO GROWTH 1 DAY Performed at [facility], [address], [place], [place] [address]    Report Status PENDING  Incomplete  Culture, blood (routine x 2)     Status: None (Preliminary result)   Collection Time: 07/28/23 12:20 AM   Specimen: BLOOD RIGHT ARM  Result Value Ref Range Status   Specimen Description   Final    BLOOD RIGHT ARM Performed at [facility], [address] [place], [place] [address]    Special Requests   Final    BOTTLES DRAWN AEROBIC AND ANAEROBIC Blood Culture results may not be optimal due to an inadequate volume of blood received in culture bottles Performed at [facility], [address], [place], [place] [address]    Culture  Setup Time   Final    GRAM POSITIVE COCCI ANAEROBIC BOTTLE ONLY CRITICAL RESULT CALLED TO, READ BACK BY AND VERIFIED WITH: PHARMD CHRISTINE SHADE ON 07/28/23 @ 1700 BY DRT Performed at [facility], [address], [place], [place] [address]    Culture [facility] POSITIVE COCCI  Final   Report Status PENDING  Incomplete  Blood Culture ID Panel (Reflexed)     Status: Abnormal   Collection Time: 07/28/23 12:20 AM  Result Value Ref Range Status   Enterococcus faecalis NOT DETECTED NOT  DETECTED Final   Enterococcus Faecium NOT DETECTED NOT DETECTED Final   Listeria monocytogenes NOT DETECTED NOT DETECTED Final   Staphylococcus species DETECTED (A) NOT DETECTED Final    Comment: CRITICAL RESULT CALLED TO, READ BACK BY AND VERIFIED WITH: PHARMD CHRISTINE SHADE ON 07/28/23 @ 1700 BY DRT    Staphylococcus aureus (BCID) NOT DETECTED NOT DETECTED Final   Staphylococcus epidermidis  DETECTED (A) NOT DETECTED Final    Comment: CRITICAL RESULT CALLED TO, READ BACK BY AND VERIFIED WITH: PHARMD CHRISTINE SHADE ON 07/28/23 @ 1700 BY DRT    Staphylococcus lugdunensis NOT DETECTED NOT DETECTED Final   Streptococcus species NOT DETECTED NOT DETECTED Final   Streptococcus agalactiae NOT DETECTED NOT DETECTED Final   Streptococcus pneumoniae NOT DETECTED NOT DETECTED Final   Streptococcus pyogenes NOT DETECTED NOT DETECTED Final   A.calcoaceticus-baumannii NOT DETECTED NOT DETECTED Final   Bacteroides fragilis NOT DETECTED NOT DETECTED Final   Enterobacterales NOT DETECTED NOT DETECTED Final   Enterobacter cloacae complex NOT DETECTED NOT DETECTED Final   Escherichia coli NOT DETECTED NOT DETECTED Final   Klebsiella aerogenes NOT DETECTED NOT DETECTED Final   Klebsiella oxytoca NOT DETECTED NOT DETECTED Final   Klebsiella pneumoniae NOT DETECTED NOT DETECTED Final   Proteus species NOT DETECTED NOT DETECTED Final   Salmonella species NOT DETECTED NOT DETECTED Final   Serratia marcescens NOT DETECTED NOT DETECTED Final   Haemophilus influenzae NOT DETECTED NOT DETECTED Final   Neisseria meningitidis NOT DETECTED NOT DETECTED Final   Pseudomonas aeruginosa NOT DETECTED NOT DETECTED Final   Stenotrophomonas maltophilia NOT DETECTED NOT DETECTED Final   Candida albicans NOT DETECTED NOT DETECTED Final   Candida auris NOT DETECTED NOT DETECTED Final   Candida glabrata NOT DETECTED NOT DETECTED Final   Candida krusei NOT DETECTED NOT DETECTED Final   Candida parapsilosis NOT DETECTED NOT DETECTED Final   Candida tropicalis NOT DETECTED NOT DETECTED Final   Cryptococcus neoformans/gattii NOT DETECTED NOT DETECTED Final   Methicillin resistance mecA/C NOT DETECTED NOT DETECTED Final    Comment: Performed at [facility], [address], [place], [place] [address]     Radiology Studies: No results found.   Pamella Pert, MD, PhD [facility]  Between 7 am - 7  pm I am available, please contact me via Amion (for emergencies) or Securechat (non urgent messages)  Between 7 pm - 7 am I am not available, please contact night coverage MD/APP via Amion

## 2023-07-29 NOTE — Plan of Care (Signed)
  Problem: Nutritional: Goal: Progress toward achieving an optimal weight will improve Outcome: Progressing   Problem: Skin Integrity: Goal: Risk for impaired skin integrity will decrease Outcome: Progressing   Problem: Education: Goal: Knowledge of General Education information will improve Description: Including pain rating scale, medication(s)/side effects and non-pharmacologic comfort measures Outcome: Progressing   Problem: Clinical Measurements: Goal: Ability to maintain clinical measurements within normal limits will improve Outcome: Progressing

## 2023-07-29 NOTE — Progress Notes (Signed)
Physical Therapy Treatment Patient Details Name: Aaron Landry MRN: [id number] DOB: Nov 04, 1924 Today's Date: 07/29/2023   History of Present Illness 89 yo male presents to the hospital on 2/10 with abnormal labs. Patient was admitted with acute renal failure, dehydration. PMH: CKD III, DM II, HTN    PT Comments   Pt admitted with above diagnosis.  Pt currently with functional limitations due to the deficits listed below (see PT Problem List). Pt communicated with nurse prior to entering room, nurse reported difficulty in voiding bladder today. Pt found to have AKI superimposed on CKD with acute renal failure and suspect UTI. Pt in bed when PT arrived. Pt appears to be more communicative and appropriate vs at time of eval. Increased ease with following directions and ability to participate with therapy. Pt required increased time and S for bed mobility, CGA for sit to stand  from EOB and mod A from commode, gait tasks 15 and 12 feet in personal room with RW and cues. Pt left seated in recliner and all needs in place. Pt denies SOB, pain and dizziness. Pt will benefit from acute skilled PT to increase their independence and safety with mobility to allow discharge.      If plan is discharge home, recommend the following: A little help with walking and/or transfers;A little help with bathing/dressing/bathroom;Assistance with cooking/housework;Assist for transportation;Help with stairs or ramp for entrance   Can travel by private vehicle        Equipment Recommendations  Rolling walker (2 wheels)    Recommendations for Other Services       Precautions / Restrictions Precautions Precautions: Fall Restrictions Weight Bearing Restrictions Per Provider Order: No     Mobility  Bed Mobility Overal bed mobility: Needs Assistance Bed Mobility: Supine to Sit     Supine to sit: Supervision, HOB elevated     General bed mobility comments: min cues    Transfers Overall transfer level:  Needs assistance Equipment used: Rolling walker (2 wheels) Transfers: Sit to/from Stand Sit to Stand: Contact guard assist, From elevated surface, Mod assist           General transfer comment: min cues for safety and use of RW slight posterior lean with initial standing from EOB with cues, pt required mod A for sit to stand from standard commode    Ambulation/Gait Ambulation/Gait assistance: Contact guard assist Gait Distance (Feet): 15 Feet Assistive device: Rolling walker (2 wheels) Gait Pattern/deviations: Step-to pattern, Trunk flexed Gait velocity: decreased     General Gait Details: cues for safety, proper distance from RW, posture and AD placement   Stairs             Wheelchair Mobility     Tilt Bed    Modified Rankin (Stroke Patients Only)       Balance Overall balance assessment: Mild deficits observed, not formally tested                                          Communication Communication Communication: Impaired Factors Affecting Communication: Hearing impaired  Cognition Arousal: Alert Behavior During Therapy: Flat affect   PT - Cognitive impairments: No family/caregiver present to determine baseline                         Following commands: Intact      Cueing    Exercises  General Comments        Pertinent Vitals/Pain Pain Assessment Pain Assessment: No/denies pain Breathing: normal Negative Vocalization: none Facial Expression: smiling or inexpressive Body Language: relaxed Consolability: no need to console PAINAD Score: 0    Home Living                          Prior Function            PT Goals (current goals can now be found in the care plan section) Acute Rehab PT Goals Patient Stated Goal: to go home PT Goal Formulation: With patient Time For Goal Achievement: 08/11/23 Potential to Achieve Goals: Good Progress towards PT goals: Progressing toward goals     Frequency    Min 1X/week      PT Plan      Co-evaluation              AM-PAC PT "6 Clicks" Mobility   Outcome Measure  Help needed turning from your back to your side while in a flat bed without using bedrails?: A Little Help needed moving from lying on your back to sitting on the side of a flat bed without using bedrails?: A Little Help needed moving to and from a bed to a chair (including a wheelchair)?: A Little Help needed standing up from a chair using your arms (e.g., wheelchair or bedside chair)?: A Little Help needed to walk in hospital room?: A Little Help needed climbing 3-5 steps with a railing? : Total 6 Click Score: 16    End of Session Equipment Utilized During Treatment: Gait belt Activity Tolerance: Patient tolerated treatment well;No increased pain Patient left: with call bell/phone within reach;in chair;with chair alarm set Nurse Communication: Mobility status PT Visit Diagnosis: Unsteadiness on feet (R26.81);Other abnormalities of gait and mobility (R26.89);Muscle weakness (generalized) (M62.81);Difficulty in walking, not elsewhere classified (R26.2)     Time: 1610-9604 PT Time Calculation (min) (ACUTE ONLY): 27 min  Charges:    $Gait Training: 8-22 mins $Therapeutic Activity: 8-22 mins PT General Charges $$ ACUTE PT VISIT: 1 Visit                     [name], PT Acute Rehab    [name] 07/29/2023, 12:28 PM

## 2023-07-29 NOTE — Care Management Obs Status (Signed)
MEDICARE OBSERVATION STATUS NOTIFICATION   Patient Details  Name: JOZSEF WESCOAT MRN: [id number] Date of Birth: 07-27-1924   Medicare Observation Status Notification Given:  Yes    Otelia Santee, LCSW 07/29/2023, 12:14 PM

## 2023-07-30 ENCOUNTER — Encounter (HOSPITAL_COMMUNITY): Payer: Self-pay | Admitting: Family Medicine

## 2023-07-30 DIAGNOSIS — N189 Chronic kidney disease, unspecified: Secondary | ICD-10-CM | POA: Diagnosis not present

## 2023-07-30 DIAGNOSIS — N179 Acute kidney failure, unspecified: Secondary | ICD-10-CM | POA: Diagnosis not present

## 2023-07-30 LAB — COMPREHENSIVE METABOLIC PANEL
ALT: 20 U/L (ref 0–44)
AST: 33 U/L (ref 15–41)
Albumin: 3.2 g/dL — ABNORMAL LOW (ref 3.5–5.0)
Alkaline Phosphatase: 76 U/L (ref 38–126)
Anion gap: 11 (ref 5–15)
BUN: 24 mg/dL — ABNORMAL HIGH (ref 8–23)
CO2: 20 mmol/L — ABNORMAL LOW (ref 22–32)
Calcium: 8.4 mg/dL — ABNORMAL LOW (ref 8.9–10.3)
Chloride: 106 mmol/L (ref 98–111)
Creatinine, Ser: 1.36 mg/dL — ABNORMAL HIGH (ref 0.61–1.24)
GFR, Estimated: 48 mL/min — ABNORMAL LOW (ref >60)
Glucose, Bld: 132 mg/dL — ABNORMAL HIGH (ref 70–99)
Potassium: 3.6 mmol/L (ref 3.5–5.1)
Sodium: 137 mmol/L (ref 135–145)
Total Bilirubin: 1 mg/dL (ref 0.0–1.2)
Total Protein: 6.2 g/dL — ABNORMAL LOW (ref 6.5–8.1)

## 2023-07-30 LAB — CULTURE, BLOOD (ROUTINE X 2)

## 2023-07-30 LAB — GLUCOSE, CAPILLARY
Glucose-Capillary: 148 mg/dL — ABNORMAL HIGH (ref 70–99)
Glucose-Capillary: 150 mg/dL — ABNORMAL HIGH (ref 70–99)
Glucose-Capillary: 230 mg/dL — ABNORMAL HIGH (ref 70–99)
Glucose-Capillary: 218 mg/dL — ABNORMAL HIGH (ref 70–99)

## 2023-07-30 LAB — CBC
HCT: 35.7 % — ABNORMAL LOW (ref 39.0–52.0)
Hemoglobin: 11.7 g/dL — ABNORMAL LOW (ref 13.0–17.0)
MCH: 31.2 pg (ref 26.0–34.0)
MCHC: 32.8 g/dL (ref 30.0–36.0)
MCV: 95.2 fL (ref 80.0–100.0)
Platelets: 226 10*3/uL (ref 150–400)
RBC: 3.75 MIL/uL — ABNORMAL LOW (ref 4.22–5.81)
RDW: 13.2 % (ref 11.5–15.5)
WBC: 7 10*3/uL (ref 4.0–10.5)
nRBC: 0 % (ref 0.0–0.2)

## 2023-07-30 LAB — PHOSPHORUS: Phosphorus: 2.2 mg/dL — ABNORMAL LOW (ref 2.5–4.6)

## 2023-07-30 LAB — MAGNESIUM: Magnesium: 1.5 mg/dL — ABNORMAL LOW (ref 1.7–2.4)

## 2023-07-30 MED ORDER — MAGNESIUM SULFATE 2 GM/50ML IV SOLN
2.0000 g | Freq: Once | INTRAVENOUS | Status: AC
  Administered 2023-07-30: 2 g via INTRAVENOUS
  Filled 2023-07-30: qty 50

## 2023-07-30 MED ORDER — POTASSIUM & SODIUM PHOSPHATES 280-160-250 MG PO PACK
2.0000 | PACK | ORAL | Status: AC
  Administered 2023-07-30 (×2): 2 via ORAL
  Filled 2023-07-30 (×2): qty 2

## 2023-07-30 MED ORDER — SODIUM CHLORIDE 0.9 % IV BOLUS
1000.0000 mL | Freq: Once | INTRAVENOUS | Status: AC
Start: 2023-07-30 — End: 2023-07-30
  Administered 2023-07-30: 1000 mL via INTRAVENOUS

## 2023-07-30 NOTE — Plan of Care (Signed)

## 2023-07-30 NOTE — Progress Notes (Signed)
PROGRESS NOTE  Aaron Landry ZOX:[id number] DOB: 1925-03-12 DOA: 07/27/2023 PCP: Geoffry Paradise, MD   LOS: 0 days   Brief Narrative / Interim history: 89 year old male with history of BPH, CKD, DM2, HTN, HLD, prior TIA, memory issues comes into the hospital with positive blood cultures.  He was recently admitted 07/26/2023-discharged 2/10 for acute encephalopathy, unclear etiology but thought to may have been due to severe hypertension, improved and then discharged home, returns after being called with positive blood cultures.  He apparently has remained quite weak since being home  Subjective / 24h Interval events: Continues to feel weaker, has been able to urinate a little bit better but still retaining some.  Remains slightly confused  Assesement and Plan: Principal Problem:   Acute kidney injury superimposed on chronic kidney disease (HCC) Active Problems:   Acute renal failure superimposed on stage 3b chronic kidney disease (HCC)   Essential hypertension   Hyperlipidemia   Generalized weakness   T2DM (type 2 diabetes mellitus) (HCC)   Altered mental status  Principal problem Acute urinary retention, concern for UTI, underlying BPH-last time patient was hospitalized he was extremely confused, and per son, it got him concerned about him having a UTI.  Currently has acute urinary retention in the room, patient trying to urinate but unable to do so -Bladder scan shows only 300 cc, however underwent In-N-Out yesterday with 800 cc -Urinalysis fairly bland, rare bacteria but no clear-cut evidence of a UTI -Suspect related to BPH, started Flomax, continue -Still with intermittent difficulties urinating, discussed with the son, could potentially need a Foley but he wants to avoid that for now.  Urine output diminished overnight, encourage p.o. intake, provide IV fluids.  If he is urinating well overnight we will go home tomorrow -CT scan 2/9 without calculi or obstructive uropathy, but it  did show enlarged prostate  Active problems CKD stage 3b-baseline creatinine 1.5-1.8, fattening stable  Staph epidermidis, Staph hominis positive blood cultures -1/4 bottles, afebrile, no leukocytosis, discussed with ID and this appears to be contaminant  Essential hypertension-continue Coreg, blood pressure overall acceptable  Chronic anemia-no bleeding, the setting of CKD  Acute metabolic encephalopathy -suspect mild underlying cognitive impairment, patient alert to place, but not year or president.  Knows it is February.  Continue supportive care.  MRI negative  Dysarthria-noticed by son, MRI done yesterday was without acute findings  Scheduled Meds:  carvedilol  3.125 mg Oral BID WC   enoxaparin (LOVENOX) injection  30 mg Subcutaneous Q24H   insulin aspart  0-5 Units Subcutaneous QHS   insulin aspart  0-9 Units Subcutaneous TID WC   tamsulosin  0.4 mg Oral QPC supper   Continuous Infusions:  sodium chloride     PRN Meds:.acetaminophen **OR** acetaminophen, hydrALAZINE  Current Outpatient Medications  Medication Instructions   Aspirin-Salicylamide-Caffeine (BC HEADACHE PO) 1 packet, 2 times daily PRN   carvedilol (COREG) 3.125 mg, Oral, 2 times daily with meals    Diet Orders (From admission, onward)     Start     Ordered   07/28/23 1644  Diet regular Room service appropriate? Yes with Assist; Fluid consistency: Thin  Diet effective now       Question Answer Comment  Room service appropriate? Yes with Assist   Fluid consistency: Thin      07/28/23 1644            DVT prophylaxis: enoxaparin (LOVENOX) injection 30 mg Start: 07/28/23 1000   Lab Results  Component Value Date  PLT 226 07/30/2023      Code Status: Full Code  Family Communication: son at bedside   Status is: Observation   Level of care: Med-Surg  Consultants:  none  Objective: Vitals:   07/29/23 1320 07/29/23 2252 07/30/23 0603 07/30/23 1351  BP: 137/62 (!) 146/68 (!) 163/71 (!)  128/59  Pulse: 73 66 67 66  Resp: 16 18 18 12   Temp: 97.9 F (36.6 C) 97.7 F (36.5 C) (!) 97.4 F (36.3 C) 97.7 F (36.5 C)  TempSrc:      SpO2: 100% 100% 99% 99%    Intake/Output Summary (Last 24 hours) at 07/30/2023 1436 Last data filed at 07/30/2023 1000 Gross per 24 hour  Intake 899.3 ml  Output 950 ml  Net -50.7 ml   Wt Readings from Last 3 Encounters:  07/26/23 62.3 kg  12/22/21 62.3 kg  12/14/21 63.5 kg    Examination:  Constitutional: NAD Eyes: lids and conjunctivae normal, no scleral icterus ENMT: mmm Neck: normal, supple Respiratory: clear to auscultation bilaterally, no wheezing, no crackles. Normal respiratory effort.  Cardiovascular: Regular rate and rhythm, no murmurs / rubs / gallops. No LE edema. Abdomen: soft, no distention, no tenderness. Bowel sounds positive.    Data Reviewed: I have independently reviewed following labs and imaging studies   CBC Recent Labs  Lab 07/26/23 1344 07/26/23 1958 07/27/23 0802 07/28/23 0010 07/28/23 0607 07/29/23 0556 07/30/23 0536  WBC 7.9   < > 7.4 7.7 7.1 6.3 7.0  HGB 12.4*   < > 11.8* 11.5* 12.2* 10.0* 11.7*  HCT 36.9*   < > 35.9* 34.8* 37.0* 30.7* 35.7*  PLT 252   < > 238 223 225 197 226  MCV 93.9   < > 92.5 93.3 93.2 96.8 95.2  MCH 31.6   < > 30.4 30.8 30.7 31.5 31.2  MCHC 33.6   < > 32.9 33.0 33.0 32.6 32.8  RDW 13.0   < > 13.2 13.2 13.2 13.1 13.2  LYMPHSABS 2.0  --   --  1.5 1.8 1.7  --   MONOABS 0.6  --   --  0.7 0.5 0.6  --   EOSABS 0.6*  --   --  0.5 0.4 0.5  --   BASOSABS 0.1  --   --  0.1 0.1 0.1  --    < > = values in this interval not displayed.    Recent Labs  Lab 07/26/23 1344 07/26/23 1826 07/27/23 0802 07/28/23 0010 07/28/23 0607 07/29/23 0556 07/30/23 0536  NA 136  --  134* 135 137 135 137  K 4.2  --  4.2 3.9 3.7 3.6 3.6  CL 103  --  102 104 106 107 106  CO2 21*  --  21* 21* 20* 18* 20*  GLUCOSE 135*  --  110* 173* 129* 151* 132*  BUN 35*  --  34* 38* 36* 30* 24*   CREATININE 1.71*   < > 1.58* 1.91* 1.78* 1.65* 1.36*  CALCIUM 9.0  --  8.7* 8.7* 8.4* 7.9* 8.4*  AST 25  --   --  26  --   --  33  ALT 15  --   --  15  --   --  20  ALKPHOS 92  --   --  80  --   --  76  BILITOT 1.0  --   --  1.0  --   --  1.0  ALBUMIN 3.6  --   --  3.4*  --   --  3.2*  MG  --   --   --   --   --   --  1.5*  HGBA1C  --   --   --   --  7.0*  --   --    < > = values in this interval not displayed.    ------------------------------------------------------------------------------------------------------------------ No results for input(s): "CHOL", "HDL", "LDLCALC", "TRIG", "CHOLHDL", "LDLDIRECT" in the last 72 hours.  Lab Results  Component Value Date   HGBA1C 7.0 (H) 07/28/2023   ------------------------------------------------------------------------------------------------------------------ No results for input(s): "TSH", "T4TOTAL", "T3FREE", "THYROIDAB" in the last 72 hours.  Invalid input(s): "FREET3"  Cardiac Enzymes No results for input(s): "CKMB", "TROPONINI", "MYOGLOBIN" in the last 168 hours.  Invalid input(s): "CK" ------------------------------------------------------------------------------------------------------------------ No results found for: "BNP"  CBG: Recent Labs  Lab 07/29/23 1116 07/29/23 1613 07/29/23 2255 07/30/23 0751 07/30/23 1248  GLUCAP 148* 152* 128* 148* 150*    Recent Results (from the past 240 hours)  Resp panel by RT-PCR (RSV, Flu A&B, Covid) Anterior Nasal Swab     Status: None   Collection Time: 07/26/23  4:06 PM   Specimen: Anterior Nasal Swab  Result Value Ref Range Status   SARS Coronavirus 2 by RT PCR NEGATIVE NEGATIVE Final    Comment: (NOTE) SARS-CoV-2 target nucleic acids are NOT DETECTED.  The SARS-CoV-2 RNA is generally detectable in upper respiratory specimens during the acute phase of infection. The lowest concentration of SARS-CoV-2 viral copies this assay can detect is 138 copies/mL. A negative result  does not preclude SARS-Cov-2 infection and should not be used as the sole basis for treatment or other patient management decisions. A negative result may occur with  improper specimen collection/handling, submission of specimen other than nasopharyngeal swab, presence of viral mutation(s) within the areas targeted by this assay, and inadequate number of viral copies(<138 copies/mL). A negative result must be combined with clinical observations, patient history, and epidemiological information. The expected result is Negative.  Fact Sheet for Patients:  BloggerCourse.com  Fact Sheet for Healthcare Providers:  SeriousBroker.it  This test is no t yet approved or cleared by the [place] FDA and  has been authorized for detection and/or diagnosis of SARS-CoV-2 by FDA under an Emergency Use Authorization (EUA). This EUA will remain  in effect (meaning this test can be used) for the duration of the COVID-19 declaration under Section 564(b)(1) of the Act, 21 U.S.C.section 360bbb-3(b)(1), unless the authorization is terminated  or revoked sooner.       Influenza A by PCR NEGATIVE NEGATIVE Final   Influenza B by PCR NEGATIVE NEGATIVE Final    Comment: (NOTE) The Xpert Xpress SARS-CoV-2/FLU/RSV plus assay is intended as an aid in the diagnosis of influenza from Nasopharyngeal swab specimens and should not be used as a sole basis for treatment. Nasal washings and aspirates are unacceptable for Xpert Xpress SARS-CoV-2/FLU/RSV testing.  Fact Sheet for Patients: BloggerCourse.com  Fact Sheet for Healthcare Providers: SeriousBroker.it  This test is not yet approved or cleared by the [place] FDA and has been authorized for detection and/or diagnosis of SARS-CoV-2 by FDA under an Emergency Use Authorization (EUA). This EUA will remain in effect (meaning this test can be used) for  the duration of the COVID-19 declaration under Section 564(b)(1) of the Act, 21 U.S.C. section 360bbb-3(b)(1), unless the authorization is terminated or revoked.     Resp Syncytial Virus by PCR NEGATIVE NEGATIVE Final    Comment: (NOTE) Fact Sheet for Patients: BloggerCourse.com  Fact Sheet for Healthcare  Providers: SeriousBroker.it  This test is not yet approved or cleared by the [place] and has been authorized for detection and/or diagnosis of SARS-CoV-2 by FDA under an Emergency Use Authorization (EUA). This EUA will remain in effect (meaning this test can be used) for the duration of the COVID-19 declaration under Section 564(b)(1) of the Act, 21 U.S.C. section 360bbb-3(b)(1), unless the authorization is terminated or revoked.  Performed at [facility], [address] [place], [place] [address]   Urine Culture     Status: None   Collection Time: 07/26/23  4:55 PM   Specimen: Urine, Clean Catch  Result Value Ref Range Status   Specimen Description   Final    URINE, CLEAN CATCH Performed at [facility], [address], [place], [place] [address]    Special Requests   Final    NONE Performed at [facility], [address], [place], [place] [address]    Culture   Final    NO GROWTH Performed at [facility], [address], [place], [place] [address]    Report Status 07/27/2023 FINAL  Final  Culture, blood (Routine X 2) w Reflex to ID Panel     Status: None (Preliminary result)   Collection Time: 07/26/23  8:33 PM   Specimen: BLOOD  Result Value Ref Range Status   Specimen Description   Final    BLOOD BLOOD LEFT WRIST Performed at [facility], [address], [place], [place] [address]    Special Requests   Final    BOTTLES DRAWN AEROBIC ONLY Blood Culture results may not be optimal due to an inadequate volume of blood  received in culture bottles Performed at [facility], [address] [place], [place] [address]    Culture   Final    NO GROWTH 3 DAYS Performed at [facility], [address] [place], [place] [address]    Report Status PENDING  Incomplete  Culture, blood (Routine X 2) w Reflex to ID Panel     Status: Abnormal   Collection Time: 07/26/23  8:38 PM   Specimen: BLOOD RIGHT ARM  Result Value Ref Range Status   Specimen Description   Final    BLOOD RIGHT ARM Performed at [facility], [address], [place], [place] [address]    Special Requests   Final    BOTTLES DRAWN AEROBIC AND ANAEROBIC Blood Culture results may not be optimal due to an inadequate volume of blood received in culture bottles Performed at [facility], [address], [place], [place] [address]    Culture  Setup Time   Final    GRAM POSITIVE COCCI IN CLUSTERS IN BOTH AEROBIC AND ANAEROBIC BOTTLES CRITICAL RESULT CALLED TO, READ BACK BY AND VERIFIED WITH: RN Darcella Cheshire [phone] @ 2123 FH    Culture (A)  Final    STAPHYLOCOCCUS EPIDERMIDIS THE SIGNIFICANCE OF ISOLATING THIS ORGANISM FROM A SINGLE SET OF BLOOD CULTURES WHEN MULTIPLE SETS ARE DRAWN IS UNCERTAIN. PLEASE NOTIFY THE MICROBIOLOGY DEPARTMENT WITHIN ONE WEEK IF SPECIATION AND SENSITIVITIES ARE REQUIRED. Performed at [facility], [address], [place], [place] [address]    Report Status 07/30/2023 FINAL  Final  Blood Culture ID Panel (Reflexed)     Status: Abnormal   Collection Time: 07/26/23  8:38 PM  Result Value Ref Range Status   Enterococcus faecalis NOT DETECTED NOT DETECTED Final  Enterococcus Faecium NOT DETECTED NOT DETECTED Final   Listeria monocytogenes NOT DETECTED NOT DETECTED Final   Staphylococcus species DETECTED (A) NOT DETECTED Final    Comment: CRITICAL RESULT CALLED TO, READ BACK BY AND VERIFIED WITH: RN Darcella Cheshire [phone] @ 2123 FH    Staphylococcus aureus (BCID) NOT  DETECTED NOT DETECTED Final   Staphylococcus epidermidis DETECTED (A) NOT DETECTED Final    Comment: Methicillin (oxacillin) resistant coagulase negative staphylococcus. Possible blood culture contaminant (unless isolated from more than one blood culture draw or clinical case suggests pathogenicity). No antibiotic treatment is indicated for blood  culture contaminants. CRITICAL RESULT CALLED TO, READ BACK BY AND VERIFIED WITH: RN Darcella Cheshire [phone] @ 2123 FH    Staphylococcus lugdunensis NOT DETECTED NOT DETECTED Final   Streptococcus species NOT DETECTED NOT DETECTED Final   Streptococcus agalactiae NOT DETECTED NOT DETECTED Final   Streptococcus pneumoniae NOT DETECTED NOT DETECTED Final   Streptococcus pyogenes NOT DETECTED NOT DETECTED Final   A.calcoaceticus-baumannii NOT DETECTED NOT DETECTED Final   Bacteroides fragilis NOT DETECTED NOT DETECTED Final   Enterobacterales NOT DETECTED NOT DETECTED Final   Enterobacter cloacae complex NOT DETECTED NOT DETECTED Final   Escherichia coli NOT DETECTED NOT DETECTED Final   Klebsiella aerogenes NOT DETECTED NOT DETECTED Final   Klebsiella oxytoca NOT DETECTED NOT DETECTED Final   Klebsiella pneumoniae NOT DETECTED NOT DETECTED Final   Proteus species NOT DETECTED NOT DETECTED Final   Salmonella species NOT DETECTED NOT DETECTED Final   Serratia marcescens NOT DETECTED NOT DETECTED Final   Haemophilus influenzae NOT DETECTED NOT DETECTED Final   Neisseria meningitidis NOT DETECTED NOT DETECTED Final   Pseudomonas aeruginosa NOT DETECTED NOT DETECTED Final   Stenotrophomonas maltophilia NOT DETECTED NOT DETECTED Final   Candida albicans NOT DETECTED NOT DETECTED Final   Candida auris NOT DETECTED NOT DETECTED Final   Candida glabrata NOT DETECTED NOT DETECTED Final   Candida krusei NOT DETECTED NOT DETECTED Final   Candida parapsilosis NOT DETECTED NOT DETECTED Final   Candida tropicalis NOT DETECTED NOT DETECTED Final   Cryptococcus  neoformans/gattii NOT DETECTED NOT DETECTED Final   Methicillin resistance mecA/C DETECTED (A) NOT DETECTED Final    Comment: CRITICAL RESULT CALLED TO, READ BACK BY AND VERIFIED WITH: RN Darcella Cheshire [phone] @ 2123 FH Performed at [facility], [address] [place], [place] [address]   Culture, blood (routine x 2)     Status: None (Preliminary result)   Collection Time: 07/28/23 12:10 AM   Specimen: BLOOD RIGHT FOREARM  Result Value Ref Range Status   Specimen Description   Final    BLOOD RIGHT FOREARM Performed at [facility], [address], [place], [place] [address]    Special Requests   Final    BOTTLES DRAWN AEROBIC AND ANAEROBIC Blood Culture adequate volume Performed at [facility], [address] [place], [place] [address]    Culture   Final    NO GROWTH 2 DAYS Performed at [facility], [address], [place], [place] [address]    Report Status PENDING  Incomplete  Culture, blood (routine x 2)     Status: Abnormal   Collection Time: 07/28/23 12:20 AM   Specimen: BLOOD RIGHT ARM  Result Value Ref Range Status   Specimen Description   Final    BLOOD RIGHT ARM Performed at [facility], [address] [place], [place] [address]    Special Requests  Final    BOTTLES DRAWN AEROBIC AND ANAEROBIC Blood Culture results may not be optimal due to an inadequate volume of blood received in culture bottles Performed at [facility], [address], [place], [place] [address]    Culture  Setup Time   Final    GRAM POSITIVE COCCI ANAEROBIC BOTTLE ONLY CRITICAL RESULT CALLED TO, READ BACK BY AND VERIFIED WITH: PHARMD CHRISTINE SHADE ON 07/28/23 @ 1700 BY DRT    Culture (A)  Final    STAPHYLOCOCCUS HOMINIS THE SIGNIFICANCE OF ISOLATING THIS ORGANISM FROM A SINGLE SET OF BLOOD CULTURES WHEN MULTIPLE SETS ARE DRAWN IS UNCERTAIN. PLEASE NOTIFY THE MICROBIOLOGY DEPARTMENT WITHIN ONE WEEK IF SPECIATION AND SENSITIVITIES  ARE REQUIRED. Performed at [facility], [address] [place], [place] [address]    Report Status 07/30/2023 FINAL  Final  Blood Culture ID Panel (Reflexed)     Status: Abnormal   Collection Time: 07/28/23 12:20 AM  Result Value Ref Range Status   Enterococcus faecalis NOT DETECTED NOT DETECTED Final   Enterococcus Faecium NOT DETECTED NOT DETECTED Final   Listeria monocytogenes NOT DETECTED NOT DETECTED Final   Staphylococcus species DETECTED (A) NOT DETECTED Final    Comment: CRITICAL RESULT CALLED TO, READ BACK BY AND VERIFIED WITH: PHARMD CHRISTINE SHADE ON 07/28/23 @ 1700 BY DRT    Staphylococcus aureus (BCID) NOT DETECTED NOT DETECTED Final   Staphylococcus epidermidis DETECTED (A) NOT DETECTED Final    Comment: CRITICAL RESULT CALLED TO, READ BACK BY AND VERIFIED WITH: PHARMD CHRISTINE SHADE ON 07/28/23 @ 1700 BY DRT    Staphylococcus lugdunensis NOT DETECTED NOT DETECTED Final   Streptococcus species NOT DETECTED NOT DETECTED Final   Streptococcus agalactiae NOT DETECTED NOT DETECTED Final   Streptococcus pneumoniae NOT DETECTED NOT DETECTED Final   Streptococcus pyogenes NOT DETECTED NOT DETECTED Final   A.calcoaceticus-baumannii NOT DETECTED NOT DETECTED Final   Bacteroides fragilis NOT DETECTED NOT DETECTED Final   Enterobacterales NOT DETECTED NOT DETECTED Final   Enterobacter cloacae complex NOT DETECTED NOT DETECTED Final   Escherichia coli NOT DETECTED NOT DETECTED Final   Klebsiella aerogenes NOT DETECTED NOT DETECTED Final   Klebsiella oxytoca NOT DETECTED NOT DETECTED Final   Klebsiella pneumoniae NOT DETECTED NOT DETECTED Final   Proteus species NOT DETECTED NOT DETECTED Final   Salmonella species NOT DETECTED NOT DETECTED Final   Serratia marcescens NOT DETECTED NOT DETECTED Final   Haemophilus influenzae NOT DETECTED NOT DETECTED Final   Neisseria meningitidis NOT DETECTED NOT DETECTED Final   Pseudomonas aeruginosa NOT DETECTED NOT DETECTED Final    Stenotrophomonas maltophilia NOT DETECTED NOT DETECTED Final   Candida albicans NOT DETECTED NOT DETECTED Final   Candida auris NOT DETECTED NOT DETECTED Final   Candida glabrata NOT DETECTED NOT DETECTED Final   Candida krusei NOT DETECTED NOT DETECTED Final   Candida parapsilosis NOT DETECTED NOT DETECTED Final   Candida tropicalis NOT DETECTED NOT DETECTED Final   Cryptococcus neoformans/gattii NOT DETECTED NOT DETECTED Final   Methicillin resistance mecA/C NOT DETECTED NOT DETECTED Final    Comment: Performed at [facility], [address] [place], [place] [address]     Radiology Studies: No results found.   Pamella Pert, MD, PhD [facility]  Between 7 am - 7 pm I am available, please contact me via Amion (for emergencies) or Securechat (non urgent messages)  Between 7 pm - 7 am I am not available, please contact night coverage MD/APP  via [employer]

## 2023-07-30 NOTE — Progress Notes (Signed)
Occupational Therapy Treatment Patient Details Name: Aaron Landry MRN: [id number] DOB: 31-Jul-1924 Today's Date: 07/30/2023   History of present illness 89 yo male presents to the hospital on 2/10 with abnormal labs. Patient was admitted with acute renal failure, dehydration. PMH: CKD III, DM II, HTN   OT comments  Patient progressing as evidenced by an increased score on 6-Clicks AM-PAC measure of occupational Performance with previous score of 15/24, and current score of 18/24. With need of less assistance with LE dressing at EOB as well as ability to perform 5 reps of sit<>Stands from recliner with CGA and no SHOB noted, compared to previous session. Patient remains limited by memory deficits, generalized weakness and decreased activity tolerance along with deficits noted below. Pt continues to demonstrate fair rehab potential and would benefit from continued skilled OT to increase safety and independence with ADLs and functional transfers to allow pt to return home safely and reduce caregiver burden and fall risk.       If plan is discharge home, recommend the following:  A little help with walking and/or transfers;A little help with bathing/dressing/bathroom;Assistance with cooking/housework;Direct supervision/assist for medications management;Assist for transportation;Help with stairs or ramp for entrance;Direct supervision/assist for financial management;Supervision due to cognitive status   Equipment Recommendations  None recommended by OT    Recommendations for Other Services      Precautions / Restrictions Precautions Precautions: Fall Restrictions Weight Bearing Restrictions Per Provider Order: No       Mobility Bed Mobility Overal bed mobility: Needs Assistance Bed Mobility: Supine to Sit     Supine to sit: Supervision, HOB elevated (Increased time/effort)          Transfers                         Balance Overall balance assessment: Needs  assistance Sitting-balance support: No upper extremity supported Sitting balance-Leahy Scale: Fair     Standing balance support: Reliant on assistive device for balance, During functional activity, No upper extremity supported Standing balance-Leahy Scale: Poor                             ADL either performed or assessed with clinical judgement   ADL Overall ADL's : Needs assistance/impaired     Grooming: Sitting;Set up               Lower Body Dressing: Sitting/lateral leans;Contact guard assist;Set up Lower Body Dressing Details (indicate cue type and reason): Pt able to sit EOB and doff then done socks with barely there CGA at trunk for safety. Toilet Transfer: Minimal assistance;Ambulation;Rolling walker (2 wheels);Cueing for safety;Cueing for sequencing Toilet Transfer Details (indicate cue type and reason): Pt stood from EOB to RW withc ues for hands. Pt required Min Assist to stand and Min As to pivot to recliner with cues for sequencing and noted body tremors. Cues for UE reach back and Min As for ctonrllled descent to chair. Pt then agreed to work on Sit <>Stands as many reps as pt would like until fatigued. Pt used arms of chair and stood (but not fully to RW-kept hands on arms of chair) and completed 5 reps, rapidly with CGA.   Toileting - Clothing Manipulation Details (indicate cue type and reason): Pt denied need. Urinal left in reach.     Functional mobility during ADLs: Minimal assistance;Contact guard assist;Rolling walker (2 wheels);Cueing for sequencing;Cueing for safety  Extremity/Trunk Assessment Upper Extremity Assessment Upper Extremity Assessment: Generalized weakness (Tremulous on RW)   Lower Extremity Assessment Lower Extremity Assessment: Defer to PT evaluation   Cervical / Trunk Assessment Cervical / Trunk Assessment: Kyphotic    Vision Baseline Vision/History: 1 Wears glasses Vision Assessment?: No apparent visual deficits    Perception     Praxis     Communication Communication Communication: Impaired Factors Affecting Communication: Hearing impaired (Doing well with earphones attached to callbell.)   Cognition Arousal: Alert Behavior During Therapy: WFL for tasks assessed/performed Cognition: Cognition impaired   Orientation impairments: Person, Place Awareness: Online awareness impaired, Intellectual awareness impaired Memory impairment (select all impairments): Short-term memory Attention impairment (select first level of impairment): Sustained attention Executive functioning impairment (select all impairments): Sequencing, Problem solving                   Following commands: Intact        Cueing   Cueing Techniques: Verbal cues  Exercises      Shoulder Instructions       General Comments      Pertinent Vitals/ Pain       Pain Assessment Pain Assessment: No/denies pain  Home Living                                          Prior Functioning/Environment              Frequency  Min 1X/week        Progress Toward Goals  OT Goals(current goals can now be found in the care plan section)  Progress towards OT goals: Progressing toward goals  Acute Rehab OT Goals Patient Stated Goal: Home when ready OT Goal Formulation: With patient Time For Goal Achievement: 08/11/23 Potential to Achieve Goals: Fair  Plan      Co-evaluation                 AM-PAC OT "6 Clicks" Daily Activity     Outcome Measure   Help from another person eating meals?: A Little (based on general assessment) Help from another person taking care of personal grooming?: A Little Help from another person toileting, which includes using toliet, bedpan, or urinal?: A Little Help from another person bathing (including washing, rinsing, drying)?: A Little Help from another person to put on and taking off regular upper body clothing?: A Little Help from another person to put  on and taking off regular lower body clothing?: A Little 6 Click Score: 18    End of Session Equipment Utilized During Treatment: Gait belt;Rolling walker (2 wheels)  OT Visit Diagnosis: Unsteadiness on feet (R26.81);Other abnormalities of gait and mobility (R26.89);Muscle weakness (generalized) (M62.81);Other symptoms and signs involving cognitive function   Activity Tolerance Patient tolerated treatment well   Patient Left in chair;with call bell/phone within reach;with chair alarm set;with nursing/sitter in room   Nurse Communication Other (comment) (Nursing assisted with getting pt warm blankets.)        Time: 1027-2536 OT Time Calculation (min): 28 min  Charges: OT General Charges $OT Visit: 1 Visit OT Treatments $Self Care/Home Management : 8-22 mins $Therapeutic Activity: 8-22 mins  Victorino Dike, OT Acute Rehab Services Office: [phone] 07/30/2023   Theodoro Clock 07/30/2023, 10:12 AM

## 2023-07-31 ENCOUNTER — Other Ambulatory Visit (HOSPITAL_COMMUNITY): Payer: Self-pay

## 2023-07-31 DIAGNOSIS — N179 Acute kidney failure, unspecified: Secondary | ICD-10-CM | POA: Diagnosis not present

## 2023-07-31 DIAGNOSIS — N189 Chronic kidney disease, unspecified: Secondary | ICD-10-CM | POA: Diagnosis not present

## 2023-07-31 LAB — BASIC METABOLIC PANEL
Anion gap: 8 (ref 5–15)
BUN: 27 mg/dL — ABNORMAL HIGH (ref 8–23)
CO2: 20 mmol/L — ABNORMAL LOW (ref 22–32)
Calcium: 7.7 mg/dL — ABNORMAL LOW (ref 8.9–10.3)
Chloride: 106 mmol/L (ref 98–111)
Creatinine, Ser: 1.49 mg/dL — ABNORMAL HIGH (ref 0.61–1.24)
GFR, Estimated: 43 mL/min — ABNORMAL LOW (ref >60)
Glucose, Bld: 131 mg/dL — ABNORMAL HIGH (ref 70–99)
Potassium: 3.8 mmol/L (ref 3.5–5.1)
Sodium: 134 mmol/L — ABNORMAL LOW (ref 135–145)

## 2023-07-31 LAB — GLUCOSE, CAPILLARY: Glucose-Capillary: 133 mg/dL — ABNORMAL HIGH (ref 70–99)

## 2023-07-31 MED ORDER — TAMSULOSIN HCL 0.4 MG PO CAPS
0.4000 mg | ORAL_CAPSULE | Freq: Every day | ORAL | 0 refills | Status: AC
  Filled 2023-07-31: qty 30, 30d supply, fill #0

## 2023-07-31 NOTE — Discharge Instructions (Signed)
Follow with Aaron Paradise, MD in 5-7 days  Please get a complete blood count and chemistry panel checked by your Primary MD at your next visit, and again as instructed by your Primary MD. Please get your medications reviewed and adjusted by your Primary MD.  Please request your Primary MD to go over all Hospital Tests and Procedure/Radiological results at the follow up, please get all Hospital records sent to your Prim MD by signing hospital release before you go home.  In some cases, there will be blood work, cultures and biopsy results pending at the time of your discharge. Please request that your primary care M.D. goes through all the records of your hospital data and follows up on these results.  If you had Pneumonia of Lung problems at the Hospital: Please get a 2 view Chest X ray done in 6-8 weeks after hospital discharge or sooner if instructed by your Primary MD.  If you have Congestive Heart Failure: Please call your Cardiologist or Primary MD anytime you have any of the following symptoms:  1) 3 pound weight gain in 24 hours or 5 pounds in 1 week  2) shortness of breath, with or without a dry hacking cough  3) swelling in the hands, feet or stomach  4) if you have to sleep on extra pillows at night in order to breathe  Follow cardiac low salt diet and 1.5 lit/day fluid restriction.  If you have diabetes Accuchecks 4 times/day, Once in AM empty stomach and then before each meal. Log in all results and show them to your primary doctor at your next visit. If any glucose reading is under 80 or above 300 call your primary MD immediately.  If you have Seizure/Convulsions/Epilepsy: Please do not drive, operate heavy machinery, participate in activities at heights or participate in high speed sports until you have seen by Primary MD or a Neurologist and advised to do so again. Per [facility] statutes, patients with seizures are not allowed to drive until they have been  seizure-free for six months.  Use caution when using heavy equipment or power tools. Avoid working on ladders or at heights. Take showers instead of baths. Ensure the water temperature is not too high on the home water heater. Do not go swimming alone. Do not lock yourself in a room alone (i.e. bathroom). When caring for infants or small children, sit down when holding, feeding, or changing them to minimize risk of injury to the child in the event you have a seizure. Maintain good sleep hygiene. Avoid alcohol.   If you had Gastrointestinal Bleeding: Please ask your Primary MD to check a complete blood count within one week of discharge or at your next visit. Your endoscopic/colonoscopic biopsies that are pending at the time of discharge, will also need to followed by your Primary MD.  Get Medicines reviewed and adjusted. Please take all your medications with you for your next visit with your Primary MD  Please request your Primary MD to go over all hospital tests and procedure/radiological results at the follow up, please ask your Primary MD to get all Hospital records sent to his/her office.  If you experience worsening of your admission symptoms, develop shortness of breath, life threatening emergency, suicidal or homicidal thoughts you must seek medical attention immediately by calling 911 or calling your MD immediately  if symptoms less severe.  You must read complete instructions/literature along with all the possible adverse reactions/side effects for all the Medicines you take  and that have been prescribed to you. Take any new Medicines after you have completely understood and accpet all the possible adverse reactions/side effects.   Do not drive or operate heavy machinery when taking Pain medications.   Do not take more than prescribed Pain, Sleep and Anxiety Medications  Special Instructions: If you have smoked or chewed Tobacco  in the last 2 yrs please stop smoking, stop any regular  Alcohol  and or any Recreational drug use.  Wear Seat belts while driving.  Please note You were cared for by a hospitalist during your hospital stay. If you have any questions about your discharge medications or the care you received while you were in the hospital after you are discharged, you can call the unit and asked to speak with the hospitalist on call if the hospitalist that took care of you is not available. Once you are discharged, your primary care physician will handle any further medical issues. Please note that NO REFILLS for any discharge medications will be authorized once you are discharged, as it is imperative that you return to your primary care physician (or establish a relationship with a primary care physician if you do not have one) for your aftercare needs so that they can reassess your need for medications and monitor your lab values.  You can reach the hospitalist office at phone [phone] or fax [phone]   If you do not have a primary care physician, you can call [phone] for a physician referral.  Activity: As tolerated with Full fall precautions use walker/cane & assistance as needed    Diet: regular  Disposition Home

## 2023-07-31 NOTE — TOC Transition Note (Signed)
Transition of Care [facility]) - Discharge Note   Patient Details  Name: Aaron Landry MRN: [id number] Date of Birth: 19-Mar-1925  [facility]) CM/SW Contact:  Otelia Santee, LCSW Phone Number: 07/31/2023, 10:06 AM   Clinical Narrative:    Pt to return home with home health PT/OT through [facility]. HH orders in place. Family to transport home. No further TOC needs identified.   Final next level of care: Home w Home Health Services Barriers to Discharge: Barriers Resolved   Patient Goals and CMS Choice Patient states their goals for this hospitalization and ongoing recovery are:: To return home CMS Medicare.gov Compare Post Acute Care list provided to:: Patient Choice offered to / list presented to : Patient [place] ownership interest in [facility].provided to::  (NA)    Discharge Placement                       Discharge Plan and Services Additional resources added to the After Visit Summary for   In-house Referral: Clinical Social Work Discharge Planning Services: NA Post Acute Care Choice: Home Health          DME Arranged: N/A DME Agency: NA       HH Arranged: PT, OT HH Agency: [facility] Date HH Agency Contacted: 07/29/23 Time HH Agency Contacted: 1408 Representative spoke with at [facility],The Agency: Clifton Custard  Social Drivers of Health (SDOH) Interventions SDOH Screenings   Food Insecurity: No Food Insecurity (07/28/2023)  Housing: Unknown (07/28/2023)  Transportation Needs: No Transportation Needs (07/28/2023)  Utilities: Not At Risk (07/28/2023)  Social Connections: Unknown (07/28/2023)  Tobacco Use: Medium Risk (07/30/2023)     Readmission Risk Interventions    12/18/2021   11:38 AM  Readmission Risk Prevention Plan  Transportation Screening Complete  PCP or Specialist Appt within 5-7 Days Complete  Home Care Screening Complete  Medication Review (RN CM) Complete

## 2023-07-31 NOTE — Progress Notes (Signed)
Physical Therapy Treatment Patient Details Name: Aaron Landry MRN: [id number] DOB: 02-04-1925 Today's Date: 07/31/2023   History of Present Illness 89 yo male presents to the hospital on 2/10 with abnormal labs. Patient was admitted with acute renal failure, dehydration. PMH: CKD III, DM II, HTN    PT Comments  Pt agreeable to mobilize and ambulated 60 ft in hallway with RW.  Pt reports he has had some cognitive issues ("but I'm also old.  How old am I?").  Continue to recommend HHPT and recommended assist as below:   If plan is discharge home, recommend the following: A little help with walking and/or transfers;A little help with bathing/dressing/bathroom;Assistance with cooking/housework;Assist for transportation;Help with stairs or ramp for entrance   Can travel by private vehicle        Equipment Recommendations  Rolling walker (2 wheels)    Recommendations for Other Services       Precautions / Restrictions Precautions Precautions: Fall     Mobility  Bed Mobility Overal bed mobility: Needs Assistance Bed Mobility: Supine to Sit     Supine to sit: Supervision, HOB elevated          Transfers Overall transfer level: Needs assistance Equipment used: Rolling walker (2 wheels) Transfers: Sit to/from Stand Sit to Stand: Min assist           General transfer comment: assist to rise and stabilize, cues for hand placement, tends to lean posteriorly again bed to self assist    Ambulation/Gait Ambulation/Gait assistance: Contact guard assist Gait Distance (Feet): 60 Feet Assistive device: Rolling walker (2 wheels) Gait Pattern/deviations: Step-through pattern, Decreased stride length, Trunk flexed Gait velocity: decreased     General Gait Details: verbal cues for RW positioning and posture, distance to pt tolerance   Stairs             Wheelchair Mobility     Tilt Bed    Modified Rankin (Stroke Patients Only)       Balance Overall balance  assessment: Mild deficits observed, not formally tested         Standing balance support: Reliant on assistive device for balance, During functional activity, No upper extremity supported Standing balance-Leahy Scale: Poor                              Communication Communication Factors Affecting Communication: Hearing impaired  Cognition Arousal: Alert Behavior During Therapy: WFL for tasks assessed/performed   PT - Cognitive impairments: Memory                       PT - Cognition Comments: pt reports cognitive impairments, observed pt asking for his spoon and when provided he then asked about his milk which was present and he had just mentioned (trying to eat cereal); pt also stated he lived with his wife and then corrected himself to say his son        Cueing    Exercises      General Comments        Pertinent Vitals/Pain Pain Assessment Pain Assessment: No/denies pain    Home Living                          Prior Function            PT Goals (current goals can now be found in the care plan section) Progress towards PT  goals: Progressing toward goals    Frequency    Min 1X/week      PT Plan      Co-evaluation              AM-PAC PT "6 Clicks" Mobility   Outcome Measure  Help needed turning from your back to your side while in a flat bed without using bedrails?: A Little Help needed moving from lying on your back to sitting on the side of a flat bed without using bedrails?: A Little Help needed moving to and from a bed to a chair (including a wheelchair)?: A Little Help needed standing up from a chair using your arms (e.g., wheelchair or bedside chair)?: A Little Help needed to walk in hospital room?: A Little Help needed climbing 3-5 steps with a railing? : A Lot 6 Click Score: 17    End of Session Equipment Utilized During Treatment: Gait belt Activity Tolerance: Patient tolerated treatment well Patient  left: in chair;with call bell/phone within reach;with chair alarm set Nurse Communication: Mobility status PT Visit Diagnosis: Other abnormalities of gait and mobility (R26.89);Muscle weakness (generalized) (M62.81)     Time: 4696-2952 PT Time Calculation (min) (ACUTE ONLY): 9 min  Charges:    $Gait Training: 8-22 mins PT General Charges $$ ACUTE PT VISIT: 1 Visit                     Paulino Door, DPT Physical Therapist Acute Rehabilitation Services Office: [phone]    Aaron Landry 07/31/2023, 4:32 PM

## 2023-07-31 NOTE — Discharge Summary (Signed)
Physician Discharge Summary  Aaron Landry KGM:[id number] DOB: 02-Oct-1924 DOA: 07/27/2023  PCP: Geoffry Paradise, MD  Admit date: 07/27/2023 Discharge date: 07/31/2023  Admitted From: home Disposition:  home  Recommendations for Outpatient Follow-up:  Follow up with PCP in 1-2 weeks Please obtain BMP/CBC in one week Please establish care and follow-up with urology within the next 2 weeks  Home Health: PT Equipment/Devices: none  Discharge Condition: stable CODE STATUS: Full code Diet Orders (From admission, onward)     Start     Ordered   07/28/23 1644  Diet regular Room service appropriate? Yes with Assist; Fluid consistency: Thin  Diet effective now       Question Answer Comment  Room service appropriate? Yes with Assist   Fluid consistency: Thin      07/28/23 1644            HPI: Per admitting MD, This is a 89 year old male with past medical history of BPH, CKD, type 2 diabetes, hypertension, hyperlipidemia, prior TIA.  Patient admitted to/92/10 with hypertensive urgency and confusion..  Patient treated and discharged.  Family called yesterday told to return to the ER as blood cultures positive.  On review in the ER 1 of 2 blood cultures positive for staph, contaminant.  Patient hemodynamically stable.  Creatinine 1.9, previous at discharge 1.58.  Per family patient is not ambulating, weak.  Previously able to walk with a cane or walker.  Readmission requested for IV fluid hydration, PT consult.  Patient did not answer questions.  Grunted few times.   Hospital Course / Discharge diagnoses: Principal Problem:   Acute kidney injury superimposed on chronic kidney disease (HCC) Active Problems:   Acute renal failure superimposed on stage 3b chronic kidney disease (HCC)   Essential hypertension   Hyperlipidemia   Generalized weakness   T2DM (type 2 diabetes mellitus) (HCC)   Altered mental status   Principal problem Acute urinary retention, concern for UTI,  underlying BPH -patient presented to the hospital with increased weakness, confusion and complaints of nonspecific generalized malaise.  Upon further discussing with the patient, he eventually pointed to his lower abdomen as being the main issue, and having difficulties urinating.  He did require In-N-Out catheterization on 2/12 with 800 cc output.  Urinalysis ruled out UTI.  On chart review, CT scan during prior hospitalization showed an enlarged prostate with chronic bladder outlet obstruction.  Patient was placed on Flomax, was hydrated and now has good urine output on his own.  Suspect this is related to BPH.  I recommend outpatient urology, he was seen in [facility] however has not seen a urologist in several years.  He can return to his prior urologist or can get established with [facility].  He did not require a Foley catheter, but may require 1 in the future.   Active problems CKD stage 3b-baseline creatinine 1.5-1.8, overall remained stable  Staph epidermidis, Staph hominis positive blood cultures -1/4 bottles, afebrile, no leukocytosis, discussed with ID and this appears to be contaminant Essential hypertension-continue Coreg, blood pressure overall acceptable Chronic anemia-no bleeding, the setting of CKD Acute metabolic encephalopathy -suspect mild underlying cognitive impairment, patient alert to place, but not year or president.  Knows it is February.  Continue supportive care.  Dysarthria-noticed by son, MRI done on admission was without acute findings  Sepsis ruled out   Discharge Instructions   Allergies as of 07/31/2023       Reactions   Codeine Itching, Rash, Other (See Comments)  General discomfort, too   Penicillins Itching, Rash   Did it involve swelling of the face/tongue/throat, SOB, or low BP? No Did it involve sudden or severe rash/hives, skin peeling, or any reaction on the inside of your mouth or nose? Yes Did you need to seek medical attention at a hospital  or doctor's office? No When did it last happen?   Decades Ago    If all above answers are "NO", may proceed with cephalosporin use.        Medication List     TAKE these medications    BC HEADACHE PO Take 1 packet by mouth 2 (two) times daily as needed (pain).   carvedilol 3.125 MG tablet Commonly known as: COREG Take 1 tablet (3.125 mg total) by mouth 2 (two) times daily with a meal.   tamsulosin 0.4 MG Caps capsule Commonly known as: FLOMAX Take 1 capsule (0.4 mg total) by mouth daily after supper.        Follow-up Information     [facility] Follow up.   Specialty: Home Health Services Why: [facility] to follow up with you at discharge to provide home health services Contact information: [address] [place] [address] [phone]         [facility] Follow up in 2 week(s).   Contact information: [address] [place] [address] [phone]                Consultations: none  Procedures/Studies:  MR Brain Wo Contrast (neuro protocol) Result Date: 07/28/2023 CLINICAL DATA:  Mental status change, unknown cause EXAM: MRI HEAD WITHOUT CONTRAST TECHNIQUE: Multiplanar, multiecho pulse sequences of the brain and surrounding structures were obtained without intravenous contrast. COMPARISON:  Head CT 07/26/2023 FINDINGS: Brain: No acute infarction, hemorrhage, hydrocephalus, extra-axial collection or mass lesion. There is a background of moderate chronic microvascular ischemic change. There are chronic infarcts in the right parietal lobe, right frontal lobe, in the left centrum semiovale. Generalized volume loss without lobar predominance. Vascular: Normal flow voids. Skull and upper cervical spine: Normal marrow signal. Sinuses/Orbits: No middle ear or mastoid effusion. Paranasal sinuses are clear. Bilateral lens replacement. Orbits are otherwise unremarkable. Other: None. IMPRESSION: No acute  intracranial abnormality. Electronically Signed   By: Lorenza Cambridge M.D.   On: 07/28/2023 09:22   CT Renal Stone Study Result Date: 07/26/2023 CLINICAL DATA:  Back pain, hypertension, urolithiasis EXAM: CT ABDOMEN AND PELVIS WITHOUT CONTRAST TECHNIQUE: Multidetector CT imaging of the abdomen and pelvis was performed following the standard protocol without IV contrast. RADIATION DOSE REDUCTION: This exam was performed according to the departmental dose-optimization program which includes automated exposure control, adjustment of the mA and/or kV according to patient size and/or use of iterative reconstruction technique. COMPARISON:  12/15/2021 FINDINGS: Lower chest: Chronic rounded atelectasis within the right lower lobe, with associated pleural thickening. No acute pleural or parenchymal lung disease. Hepatobiliary: Unremarkable unenhanced appearance of the liver and gallbladder. Pancreas: Unremarkable unenhanced appearance. Spleen: Unremarkable unenhanced appearance. Adrenals/Urinary Tract: Stable bilateral renal cortical cysts which do not require specific imaging follow-up. No urinary tract calculi or obstructive uropathy within either kidney. Trabeculated distended bladder consistent with chronic bladder outlet obstruction. The adrenals are unremarkable. Stomach/Bowel: No bowel obstruction or ileus. Normal appendix right lower quadrant. Extensive diverticulosis throughout the colon without evidence of acute diverticulitis. No bowel wall thickening or inflammatory change. Vascular/Lymphatic: Aortic atherosclerosis. No enlarged abdominal or pelvic lymph nodes. Reproductive: Stable enlarged prostate. Other:  No free fluid or free intraperitoneal gas. Postsurgical changes from prior right inguinal hernia repair, with small residual or recurrent fat containing right inguinal hernia. No bowel herniation. Musculoskeletal: No acute or destructive bony abnormalities. Reconstructed images demonstrate no additional  findings. IMPRESSION: 1. No evidence of urinary tract calculi or obstructive uropathy. 2. Diffuse colonic diverticulosis without diverticulitis. 3. Enlarged prostate, with stable findings of chronic bladder outlet obstruction. 4.  Aortic Atherosclerosis (ICD10-I70.0). 5. Stable small fat containing right inguinal hernia. 6. Stable right lower lobe rounded atelectasis and overlying pleural thickening. Electronically Signed   By: Sharlet Salina M.D.   On: 07/26/2023 17:05   DG Chest Port 1 View Result Date: 07/26/2023 CLINICAL DATA:  Altered mental status EXAM: PORTABLE CHEST 1 VIEW COMPARISON:  12/16/2021 FINDINGS: The heart size and mediastinal contours are within normal limits. Bandlike scarring of the right lung base. No acute airspace opacity. Large calcified loose bodies in the glenohumeral joints. IMPRESSION: Bandlike scarring of the right lung base. No acute airspace opacity. Electronically Signed   By: Jearld Lesch M.D.   On: 07/26/2023 16:33   CT Head Wo Contrast Result Date: 07/26/2023 CLINICAL DATA:  Mental status change, urinary frequency for 4 days, intermittent confusion with aggression EXAM: CT HEAD WITHOUT CONTRAST TECHNIQUE: Contiguous axial images were obtained from the base of the skull through the vertex without intravenous contrast. RADIATION DOSE REDUCTION: This exam was performed according to the departmental dose-optimization program which includes automated exposure control, adjustment of the mA and/or kV according to patient size and/or use of iterative reconstruction technique. COMPARISON:  09/08/2020 FINDINGS: Brain: No intracranial hemorrhage, mass effect, or evidence of acute infarct. No hydrocephalus. No extra-axial fluid collection. Age-commensurate cerebral atrophy and chronic small vessel ischemic disease. Chronic bilateral basal ganglia infarcts. Vascular: No hyperdense vessel. Intracranial arterial calcification. Skull: No fracture or focal lesion. Sinuses/Orbits: No acute  finding. Other: None. IMPRESSION: No acute intracranial abnormality. Electronically Signed   By: Minerva Fester M.D.   On: 07/26/2023 14:09     Subjective: - no chest pain, shortness of breath, no abdominal pain, nausea or vomiting.   Discharge Exam: BP 135/68 (BP Location: Left Arm)   Pulse 77   Temp 98.4 F (36.9 C)   Resp 18   Ht 6' (1.829 m)   Wt 62.3 kg   SpO2 98%   BMI 18.63 kg/m   General: Pt is alert, awake, not in acute distress Cardiovascular: RRR, S1/S2 +, no rubs, no gallops Respiratory: CTA bilaterally, no wheezing, no rhonchi Abdominal: Soft, NT, ND, bowel sounds + Extremities: no edema, no cyanosis    The results of significant diagnostics from this hospitalization (including imaging, microbiology, ancillary and laboratory) are listed below for reference.     Microbiology: Recent Results (from the past 240 hours)  Resp panel by RT-PCR (RSV, Flu A&B, Covid) Anterior Nasal Swab     Status: None   Collection Time: 07/26/23  4:06 PM   Specimen: Anterior Nasal Swab  Result Value Ref Range Status   SARS Coronavirus 2 by RT PCR NEGATIVE NEGATIVE Final    Comment: (NOTE) SARS-CoV-2 target nucleic acids are NOT DETECTED.  The SARS-CoV-2 RNA is generally detectable in upper respiratory specimens during the acute phase of infection. The lowest concentration of SARS-CoV-2 viral copies this assay can detect is 138 copies/mL. A negative result does not preclude SARS-Cov-2 infection and should not be used as the sole basis for treatment or other patient management decisions. A negative result may occur with  improper specimen collection/handling, submission of specimen other than nasopharyngeal swab, presence of viral mutation(s) within the areas targeted by this assay, and inadequate number of viral copies(<138 copies/mL). A negative result must be combined with clinical observations, patient history, and epidemiological information. The expected result is  Negative.  Fact Sheet for Patients:  BloggerCourse.com  Fact Sheet for Healthcare Providers:  SeriousBroker.it  This test is no t yet approved or cleared by the [place] FDA and  has been authorized for detection and/or diagnosis of SARS-CoV-2 by FDA under an Emergency Use Authorization (EUA). This EUA will remain  in effect (meaning this test can be used) for the duration of the COVID-19 declaration under Section 564(b)(1) of the Act, 21 U.S.C.section 360bbb-3(b)(1), unless the authorization is terminated  or revoked sooner.       Influenza A by PCR NEGATIVE NEGATIVE Final   Influenza B by PCR NEGATIVE NEGATIVE Final    Comment: (NOTE) The Xpert Xpress SARS-CoV-2/FLU/RSV plus assay is intended as an aid in the diagnosis of influenza from Nasopharyngeal swab specimens and should not be used as a sole basis for treatment. Nasal washings and aspirates are unacceptable for Xpert Xpress SARS-CoV-2/FLU/RSV testing.  Fact Sheet for Patients: BloggerCourse.com  Fact Sheet for Healthcare Providers: SeriousBroker.it  This test is not yet approved or cleared by the [place] FDA and has been authorized for detection and/or diagnosis of SARS-CoV-2 by FDA under an Emergency Use Authorization (EUA). This EUA will remain in effect (meaning this test can be used) for the duration of the COVID-19 declaration under Section 564(b)(1) of the Act, 21 U.S.C. section 360bbb-3(b)(1), unless the authorization is terminated or revoked.     Resp Syncytial Virus by PCR NEGATIVE NEGATIVE Final    Comment: (NOTE) Fact Sheet for Patients: BloggerCourse.com  Fact Sheet for Healthcare Providers: SeriousBroker.it  This test is not yet approved or cleared by the [place] FDA and has been authorized for detection and/or diagnosis of  SARS-CoV-2 by FDA under an Emergency Use Authorization (EUA). This EUA will remain in effect (meaning this test can be used) for the duration of the COVID-19 declaration under Section 564(b)(1) of the Act, 21 U.S.C. section 360bbb-3(b)(1), unless the authorization is terminated or revoked.  Performed at [facility], [address], [place], [place] [address]   Urine Culture     Status: None   Collection Time: 07/26/23  4:55 PM   Specimen: Urine, Clean Catch  Result Value Ref Range Status   Specimen Description   Final    URINE, CLEAN CATCH Performed at [facility], [address], [place], [place] [address]    Special Requests   Final    NONE Performed at [facility], [address] [place], [place] [address]    Culture   Final    NO GROWTH Performed at [facility], [address] [place], [place] [address]    Report Status 07/27/2023 FINAL  Final  Culture, blood (Routine X 2) w Reflex to ID Panel     Status: None (Preliminary result)   Collection Time: 07/26/23  8:33 PM   Specimen: BLOOD  Result Value Ref Range Status   Specimen Description   Final    BLOOD BLOOD LEFT WRIST Performed at [facility], [address], [place], [place] [address]    Special Requests   Final    BOTTLES DRAWN AEROBIC ONLY Blood Culture results may not be optimal due to  an inadequate volume of blood received in culture bottles Performed at [facility], [address], [place], [place] [address]    Culture   Final    NO GROWTH 3 DAYS Performed at [facility], [address], [place], [place] [address]    Report Status PENDING  Incomplete  Culture, blood (Routine X 2) w Reflex to ID Panel     Status: Abnormal   Collection Time: 07/26/23  8:38 PM   Specimen: BLOOD RIGHT ARM  Result Value Ref Range Status   Specimen Description   Final    BLOOD RIGHT ARM Performed at [facility], [address] [place], [place] [address]    Special Requests   Final    BOTTLES DRAWN AEROBIC AND ANAEROBIC Blood Culture results may not be optimal due to an inadequate volume of blood received in culture bottles Performed at [facility], [address], [place], [place] [address]    Culture  Setup Time   Final    GRAM POSITIVE COCCI IN CLUSTERS IN BOTH AEROBIC AND ANAEROBIC BOTTLES CRITICAL RESULT CALLED TO, READ BACK BY AND VERIFIED WITH: RN Darcella Cheshire [phone] @ 2123 FH    Culture (A)  Final    STAPHYLOCOCCUS EPIDERMIDIS THE SIGNIFICANCE OF ISOLATING THIS ORGANISM FROM A SINGLE SET OF BLOOD CULTURES WHEN MULTIPLE SETS ARE DRAWN IS UNCERTAIN. PLEASE NOTIFY THE MICROBIOLOGY DEPARTMENT WITHIN ONE WEEK IF SPECIATION AND SENSITIVITIES ARE REQUIRED. Performed at [facility], [address], [place], [place] [address]    Report Status 07/30/2023 FINAL  Final  Blood Culture ID Panel (Reflexed)     Status: Abnormal   Collection Time: 07/26/23  8:38 PM  Result Value Ref Range Status   Enterococcus faecalis NOT DETECTED NOT DETECTED Final   Enterococcus Faecium NOT DETECTED NOT DETECTED Final   Listeria monocytogenes NOT DETECTED NOT DETECTED Final   Staphylococcus species DETECTED (A) NOT DETECTED Final    Comment: CRITICAL RESULT CALLED TO, READ BACK BY AND VERIFIED WITH: RN Darcella Cheshire [phone] @ 2123 FH    Staphylococcus aureus (BCID) NOT DETECTED NOT DETECTED Final   Staphylococcus epidermidis DETECTED (A) NOT DETECTED Final    Comment: Methicillin (oxacillin) resistant coagulase negative staphylococcus. Possible blood culture contaminant (unless isolated from more than one blood culture draw or clinical case suggests pathogenicity). No antibiotic treatment is indicated for blood  culture contaminants. CRITICAL RESULT CALLED TO, READ BACK BY AND VERIFIED WITH: RN Darcella Cheshire [phone] @ 2123 FH    Staphylococcus lugdunensis NOT DETECTED NOT DETECTED Final    Streptococcus species NOT DETECTED NOT DETECTED Final   Streptococcus agalactiae NOT DETECTED NOT DETECTED Final   Streptococcus pneumoniae NOT DETECTED NOT DETECTED Final   Streptococcus pyogenes NOT DETECTED NOT DETECTED Final   A.calcoaceticus-baumannii NOT DETECTED NOT DETECTED Final   Bacteroides fragilis NOT DETECTED NOT DETECTED Final   Enterobacterales NOT DETECTED NOT DETECTED Final   Enterobacter cloacae complex NOT DETECTED NOT DETECTED Final   Escherichia coli NOT DETECTED NOT DETECTED Final   Klebsiella aerogenes NOT DETECTED NOT DETECTED Final   Klebsiella oxytoca NOT DETECTED NOT DETECTED Final   Klebsiella pneumoniae NOT DETECTED NOT DETECTED Final   Proteus species NOT DETECTED NOT DETECTED Final   Salmonella species NOT DETECTED NOT DETECTED Final   Serratia marcescens NOT DETECTED NOT DETECTED Final   Haemophilus influenzae NOT DETECTED NOT DETECTED Final   Neisseria meningitidis NOT DETECTED NOT DETECTED Final  Pseudomonas aeruginosa NOT DETECTED NOT DETECTED Final   Stenotrophomonas maltophilia NOT DETECTED NOT DETECTED Final   Candida albicans NOT DETECTED NOT DETECTED Final   Candida auris NOT DETECTED NOT DETECTED Final   Candida glabrata NOT DETECTED NOT DETECTED Final   Candida krusei NOT DETECTED NOT DETECTED Final   Candida parapsilosis NOT DETECTED NOT DETECTED Final   Candida tropicalis NOT DETECTED NOT DETECTED Final   Cryptococcus neoformans/gattii NOT DETECTED NOT DETECTED Final   Methicillin resistance mecA/C DETECTED (A) NOT DETECTED Final    Comment: CRITICAL RESULT CALLED TO, READ BACK BY AND VERIFIED WITH: RN Darcella Cheshire [phone] @ 2123 FH Performed at [facility], [address], [place], [place] [address]   Culture, blood (routine x 2)     Status: None (Preliminary result)   Collection Time: 07/28/23 12:10 AM   Specimen: BLOOD RIGHT FOREARM  Result Value Ref Range Status   Specimen Description   Final    BLOOD RIGHT  FOREARM Performed at [facility], [address], [place], [place] [address]    Special Requests   Final    BOTTLES DRAWN AEROBIC AND ANAEROBIC Blood Culture adequate volume Performed at [facility], [address], [place], [place] [address]    Culture   Final    NO GROWTH 2 DAYS Performed at [facility], [address] [place], [place] [address]    Report Status PENDING  Incomplete  Culture, blood (routine x 2)     Status: Abnormal   Collection Time: 07/28/23 12:20 AM   Specimen: BLOOD RIGHT ARM  Result Value Ref Range Status   Specimen Description   Final    BLOOD RIGHT ARM Performed at [facility], [address], [place], [place] [address]    Special Requests   Final    BOTTLES DRAWN AEROBIC AND ANAEROBIC Blood Culture results may not be optimal due to an inadequate volume of blood received in culture bottles Performed at [facility], [address], [place], [place] [address]    Culture  Setup Time   Final    GRAM POSITIVE COCCI ANAEROBIC BOTTLE ONLY CRITICAL RESULT CALLED TO, READ BACK BY AND VERIFIED WITH: PHARMD CHRISTINE SHADE ON 07/28/23 @ 1700 BY DRT    Culture (A)  Final    STAPHYLOCOCCUS HOMINIS THE SIGNIFICANCE OF ISOLATING THIS ORGANISM FROM A SINGLE SET OF BLOOD CULTURES WHEN MULTIPLE SETS ARE DRAWN IS UNCERTAIN. PLEASE NOTIFY THE MICROBIOLOGY DEPARTMENT WITHIN ONE WEEK IF SPECIATION AND SENSITIVITIES ARE REQUIRED. Performed at [facility], [address] [place], [place] [address]    Report Status 07/30/2023 FINAL  Final  Blood Culture ID Panel (Reflexed)     Status: Abnormal   Collection Time: 07/28/23 12:20 AM  Result Value Ref Range Status   Enterococcus faecalis NOT DETECTED NOT DETECTED Final   Enterococcus Faecium NOT DETECTED NOT DETECTED Final   Listeria monocytogenes NOT DETECTED NOT DETECTED Final   Staphylococcus species DETECTED (A) NOT DETECTED Final    Comment: CRITICAL  RESULT CALLED TO, READ BACK BY AND VERIFIED WITH: PHARMD CHRISTINE SHADE ON 07/28/23 @ 1700 BY DRT    Staphylococcus aureus (BCID) NOT DETECTED NOT DETECTED Final   Staphylococcus epidermidis DETECTED (A) NOT DETECTED Final    Comment: CRITICAL RESULT CALLED TO, READ BACK BY AND VERIFIED WITH: PHARMD CHRISTINE SHADE ON 07/28/23 @ 1700 BY DRT    Staphylococcus lugdunensis NOT DETECTED NOT DETECTED Final  Streptococcus species NOT DETECTED NOT DETECTED Final   Streptococcus agalactiae NOT DETECTED NOT DETECTED Final   Streptococcus pneumoniae NOT DETECTED NOT DETECTED Final   Streptococcus pyogenes NOT DETECTED NOT DETECTED Final   A.calcoaceticus-baumannii NOT DETECTED NOT DETECTED Final   Bacteroides fragilis NOT DETECTED NOT DETECTED Final   Enterobacterales NOT DETECTED NOT DETECTED Final   Enterobacter cloacae complex NOT DETECTED NOT DETECTED Final   Escherichia coli NOT DETECTED NOT DETECTED Final   Klebsiella aerogenes NOT DETECTED NOT DETECTED Final   Klebsiella oxytoca NOT DETECTED NOT DETECTED Final   Klebsiella pneumoniae NOT DETECTED NOT DETECTED Final   Proteus species NOT DETECTED NOT DETECTED Final   Salmonella species NOT DETECTED NOT DETECTED Final   Serratia marcescens NOT DETECTED NOT DETECTED Final   Haemophilus influenzae NOT DETECTED NOT DETECTED Final   Neisseria meningitidis NOT DETECTED NOT DETECTED Final   Pseudomonas aeruginosa NOT DETECTED NOT DETECTED Final   Stenotrophomonas maltophilia NOT DETECTED NOT DETECTED Final   Candida albicans NOT DETECTED NOT DETECTED Final   Candida auris NOT DETECTED NOT DETECTED Final   Candida glabrata NOT DETECTED NOT DETECTED Final   Candida krusei NOT DETECTED NOT DETECTED Final   Candida parapsilosis NOT DETECTED NOT DETECTED Final   Candida tropicalis NOT DETECTED NOT DETECTED Final   Cryptococcus neoformans/gattii NOT DETECTED NOT DETECTED Final   Methicillin resistance mecA/C NOT DETECTED NOT DETECTED Final     Comment: Performed at [facility], [address] [place], [place] [address]     Labs: Basic Metabolic Panel: Recent Labs  Lab 07/28/23 0010 07/28/23 0607 07/29/23 0556 07/30/23 0536 07/31/23 0542  NA 135 137 135 137 134*  K 3.9 3.7 3.6 3.6 3.8  CL 104 106 107 106 106  CO2 21* 20* 18* 20* 20*  GLUCOSE 173* 129* 151* 132* 131*  BUN 38* 36* 30* 24* 27*  CREATININE 1.91* 1.78* 1.65* 1.36* 1.49*  CALCIUM 8.7* 8.4* 7.9* 8.4* 7.7*  MG  --   --   --  1.5*  --   PHOS  --   --   --  2.2*  --    Liver Function Tests: Recent Labs  Lab 07/26/23 1344 07/28/23 0010 07/30/23 0536  AST 25 26 33  ALT 15 15 20   ALKPHOS 92 80 76  BILITOT 1.0 1.0 1.0  PROT 7.4 6.8 6.2*  ALBUMIN 3.6 3.4* 3.2*   CBC: Recent Labs  Lab 07/26/23 1344 07/26/23 1958 07/27/23 0802 07/28/23 0010 07/28/23 0607 07/29/23 0556 07/30/23 0536  WBC 7.9   < > 7.4 7.7 7.1 6.3 7.0  NEUTROABS 4.5  --   --  4.9 4.3 3.5  --   HGB 12.4*   < > 11.8* 11.5* 12.2* 10.0* 11.7*  HCT 36.9*   < > 35.9* 34.8* 37.0* 30.7* 35.7*  MCV 93.9   < > 92.5 93.3 93.2 96.8 95.2  PLT 252   < > 238 223 225 197 226   < > = values in this interval not displayed.   CBG: Recent Labs  Lab 07/30/23 0751 07/30/23 1248 07/30/23 1811 07/30/23 2111 07/31/23 0739  GLUCAP 148* 150* 230* 218* 133*   Hgb A1c No results for input(s): "HGBA1C" in the last 72 hours. Lipid Profile No results for input(s): "CHOL", "HDL", "LDLCALC", "TRIG", "CHOLHDL", "LDLDIRECT" in the last 72 hours. Thyroid function studies No results for input(s): "TSH", "T4TOTAL", "T3FREE", "THYROIDAB" in the last 72 hours.  Invalid input(s): "FREET3" Urinalysis    Component Value  Date/Time   COLORURINE STRAW (A) 07/29/2023 0959   APPEARANCEUR CLEAR 07/29/2023 0959   LABSPEC 1.008 07/29/2023 0959   PHURINE 5.0 07/29/2023 0959   GLUCOSEU NEGATIVE 07/29/2023 0959   HGBUR SMALL (A) 07/29/2023 0959   BILIRUBINUR NEGATIVE 07/29/2023 0959   KETONESUR NEGATIVE  07/29/2023 0959   PROTEINUR NEGATIVE 07/29/2023 0959   UROBILINOGEN 1.0 10/08/2008 1721   NITRITE NEGATIVE 07/29/2023 0959   LEUKOCYTESUR NEGATIVE 07/29/2023 0959    FURTHER DISCHARGE INSTRUCTIONS:   Get Medicines reviewed and adjusted: Please take all your medications with you for your next visit with your Primary MD   Laboratory/radiological data: Please request your Primary MD to go over all hospital tests and procedure/radiological results at the follow up, please ask your Primary MD to get all Hospital records sent to his/her office.   In some cases, they will be blood work, cultures and biopsy results pending at the time of your discharge. Please request that your primary care M.D. goes through all the records of your hospital data and follows up on these results.   Also Note the following: If you experience worsening of your admission symptoms, develop shortness of breath, life threatening emergency, suicidal or homicidal thoughts you must seek medical attention immediately by calling 911 or calling your MD immediately  if symptoms less severe.   You must read complete instructions/literature along with all the possible adverse reactions/side effects for all the Medicines you take and that have been prescribed to you. Take any new Medicines after you have completely understood and accpet all the possible adverse reactions/side effects.    Do not drive when taking Pain medications or sleeping medications (Benzodaizepines)   Do not take more than prescribed Pain, Sleep and Anxiety Medications. It is not advisable to combine anxiety,sleep and pain medications without talking with your primary care practitioner   Special Instructions: If you have smoked or chewed Tobacco  in the last 2 yrs please stop smoking, stop any regular Alcohol  and or any Recreational drug use.   Wear Seat belts while driving.   Please note: You were cared for by a hospitalist during your hospital stay. Once  you are discharged, your primary care physician will handle any further medical issues. Please note that NO REFILLS for any discharge medications will be authorized once you are discharged, as it is imperative that you return to your primary care physician (or establish a relationship with a primary care physician if you do not have one) for your post hospital discharge needs so that they can reassess your need for medications and monitor your lab values.  Time coordinating discharge: 35 minutes  SIGNED:  Pamella Pert, MD, PhD 07/31/2023, 8:41 AM

## 2023-07-31 NOTE — Plan of Care (Signed)
  Problem: Fluid Volume: Goal: Ability to maintain a balanced intake and output will improve Outcome: Progressing   Problem: Nutritional: Goal: Maintenance of adequate nutrition will improve Outcome: Progressing   Problem: Education: Goal: Knowledge of General Education information will improve Description: Including pain rating scale, medication(s)/side effects and non-pharmacologic comfort measures Outcome: Progressing   Problem: Clinical Measurements: Goal: Will remain free from infection Outcome: Progressing Goal: Diagnostic test results will improve Outcome: Progressing   Problem: Elimination: Goal: Will not experience complications related to urinary retention Outcome: Progressing

## 2023-08-01 LAB — CULTURE, BLOOD (ROUTINE X 2): Culture: NO GROWTH

## 2023-08-02 LAB — CULTURE, BLOOD (ROUTINE X 2)
Culture: NO GROWTH
Special Requests: ADEQUATE

## 2023-08-04 NOTE — Progress Notes (Signed)
COVID/RSV/Flu ordered since patient is confused and possible exposure to respiratory viruses

## 2023-09-08 ENCOUNTER — Ambulatory Visit: Payer: Medicare Other | Admitting: Podiatry

## 2024-04-16 DEATH — deceased
